# Patient Record
Sex: Female | Born: 1945 | Race: White | Hispanic: No | Marital: Married | Smoking: Never smoker
Health system: Southern US, Community
[De-identification: ages and names within clinical notes are randomized; demographics above are authoritative.]

## PROBLEM LIST (undated history)

## (undated) DIAGNOSIS — R112 Nausea with vomiting, unspecified: Secondary | ICD-10-CM

## (undated) DIAGNOSIS — J45909 Unspecified asthma, uncomplicated: Secondary | ICD-10-CM

## (undated) DIAGNOSIS — M1990 Unspecified osteoarthritis, unspecified site: Secondary | ICD-10-CM

## (undated) DIAGNOSIS — K635 Polyp of colon: Secondary | ICD-10-CM

## (undated) DIAGNOSIS — Z9889 Other specified postprocedural states: Secondary | ICD-10-CM

## (undated) DIAGNOSIS — N6019 Diffuse cystic mastopathy of unspecified breast: Secondary | ICD-10-CM

## (undated) DIAGNOSIS — I82403 Acute embolism and thrombosis of unspecified deep veins of lower extremity, bilateral: Secondary | ICD-10-CM

## (undated) DIAGNOSIS — D689 Coagulation defect, unspecified: Secondary | ICD-10-CM

## (undated) DIAGNOSIS — N3090 Cystitis, unspecified without hematuria: Secondary | ICD-10-CM

## (undated) DIAGNOSIS — I10 Essential (primary) hypertension: Secondary | ICD-10-CM

## (undated) DIAGNOSIS — T8859XA Other complications of anesthesia, initial encounter: Secondary | ICD-10-CM

## (undated) DIAGNOSIS — R7303 Prediabetes: Secondary | ICD-10-CM

## (undated) DIAGNOSIS — T4145XA Adverse effect of unspecified anesthetic, initial encounter: Secondary | ICD-10-CM

## (undated) DIAGNOSIS — E119 Type 2 diabetes mellitus without complications: Secondary | ICD-10-CM

## (undated) HISTORY — PX: COLONOSCOPY: SHX174

## (undated) HISTORY — DX: Essential (primary) hypertension: I10

## (undated) HISTORY — PX: OTHER SURGICAL HISTORY: SHX169

## (undated) HISTORY — DX: Diffuse cystic mastopathy of unspecified breast: N60.19

## (undated) HISTORY — DX: Cystitis, unspecified without hematuria: N30.90

## (undated) HISTORY — DX: Acute embolism and thrombosis of unspecified deep veins of lower extremity, bilateral: I82.403

## (undated) HISTORY — PX: BREAST BIOPSY: SHX20

## (undated) HISTORY — DX: Polyp of colon: K63.5

## (undated) HISTORY — PX: BREAST CYST ASPIRATION: SHX578

## (undated) HISTORY — PX: SPINE SURGERY: SHX786

## (undated) HISTORY — DX: Coagulation defect, unspecified: D68.9

## (undated) HISTORY — DX: Unspecified osteoarthritis, unspecified site: M19.90

---

## 1974-10-13 HISTORY — PX: TUBAL LIGATION: SHX77

## 1974-10-13 HISTORY — PX: BREAST LUMPECTOMY: SHX2

## 1992-10-13 DIAGNOSIS — K635 Polyp of colon: Secondary | ICD-10-CM

## 1992-10-13 HISTORY — DX: Polyp of colon: K63.5

## 1992-10-13 HISTORY — PX: POLYPECTOMY: SHX149

## 2000-10-13 HISTORY — PX: BREAST SURGERY: SHX581

## 2004-07-31 ENCOUNTER — Ambulatory Visit: Payer: Self-pay | Admitting: General Surgery

## 2005-09-30 ENCOUNTER — Ambulatory Visit: Payer: Self-pay | Admitting: General Surgery

## 2006-01-23 ENCOUNTER — Ambulatory Visit: Payer: Self-pay | Admitting: Gastroenterology

## 2006-11-05 ENCOUNTER — Ambulatory Visit: Payer: Self-pay | Admitting: General Surgery

## 2007-12-29 ENCOUNTER — Ambulatory Visit: Payer: Self-pay | Admitting: General Surgery

## 2009-01-02 ENCOUNTER — Ambulatory Visit: Payer: Self-pay | Admitting: General Surgery

## 2010-01-11 ENCOUNTER — Ambulatory Visit: Payer: Self-pay | Admitting: General Surgery

## 2010-01-28 ENCOUNTER — Ambulatory Visit: Payer: Self-pay | Admitting: General Surgery

## 2010-04-26 ENCOUNTER — Ambulatory Visit: Payer: Self-pay | Admitting: Gastroenterology

## 2010-08-30 ENCOUNTER — Ambulatory Visit: Payer: Self-pay | Admitting: General Surgery

## 2011-03-21 ENCOUNTER — Ambulatory Visit: Payer: Self-pay | Admitting: General Surgery

## 2012-03-26 ENCOUNTER — Ambulatory Visit: Payer: Self-pay | Admitting: General Surgery

## 2012-04-05 ENCOUNTER — Ambulatory Visit: Payer: Self-pay | Admitting: General Surgery

## 2012-04-12 HISTORY — PX: HAND SURGERY: SHX662

## 2012-04-21 ENCOUNTER — Ambulatory Visit: Payer: Self-pay | Admitting: Unknown Physician Specialty

## 2013-04-14 ENCOUNTER — Ambulatory Visit: Payer: Self-pay | Admitting: General Surgery

## 2013-04-21 ENCOUNTER — Ambulatory Visit: Payer: Self-pay | Admitting: General Surgery

## 2013-04-22 ENCOUNTER — Encounter: Payer: Self-pay | Admitting: General Surgery

## 2013-05-04 ENCOUNTER — Ambulatory Visit: Payer: Self-pay | Admitting: General Surgery

## 2013-05-19 ENCOUNTER — Encounter: Payer: Self-pay | Admitting: General Surgery

## 2013-05-19 ENCOUNTER — Ambulatory Visit (INDEPENDENT_AMBULATORY_CARE_PROVIDER_SITE_OTHER): Payer: Medicare Other | Admitting: General Surgery

## 2013-05-19 VITALS — BP 138/78 | HR 64 | Resp 14 | Ht 61.5 in | Wt 165.0 lb

## 2013-05-19 DIAGNOSIS — N6019 Diffuse cystic mastopathy of unspecified breast: Secondary | ICD-10-CM

## 2013-05-19 DIAGNOSIS — Z1239 Encounter for other screening for malignant neoplasm of breast: Secondary | ICD-10-CM

## 2013-05-19 NOTE — Progress Notes (Signed)
Patient ID: Chelsey [name], female   DOB: October 17, 1945, 67 y.o.   MRN: [id number]  Chief Complaint  Patient presents with  . Follow-up    mammogram    HPI Chelsey [name] is a 67 y.o. female.  who presents for her annual breast evaluation and follow up mammogram. The most recent mammogram was done on 04-21-13 .  Patient does perform regular self breast checks and gets regular mammograms done.  No new breast issues.  HPI  Past Medical History  Diagnosis Date  . Hypertension   . Diffuse cystic mastopathy   . Cystitis   . Cancer     Colon  . Colon polyp 1994  . Arthritis     Past Surgical History  Procedure Laterality Date  . Colonoscopy  2011  . Polypectomy  1994  . Breast surgery Left 2002    excision  . Breast lumpectomy Right 1976  . Tubal ligation  1976  . Hand surgery Left July 2013    thumb    Family History  Problem Relation Age of Onset  . Cancer Father     lung  . Cancer Mother     colon  . Cancer Sister     lung    Social History History  Substance Use Topics  . Smoking status: Never Smoker   . Smokeless tobacco: Never Used  . Alcohol Use: No    No Known Allergies  Current Outpatient Prescriptions  Medication Sig Dispense Refill  . aspirin 81 MG tablet Take 81 mg by mouth daily. 2 daily      . Calcium Carbonate-Vit D-Min (CALTRATE 600+D PLUS PO) Take 2 tablets by mouth daily.      Tery Sanfilippo Calcium (STOOL SOFTENER PO) Take 2 tablets by mouth daily.      Marland Kitchen ECHINACEA PLUS PO Take 2 tablets by mouth daily.      . Glucosamine 750 MG TABS Take 2 tablets by mouth daily.      . hydrochlorothiazide (HYDRODIURIL) 12.5 MG tablet Take 12.5 mg by mouth daily.      . Multiple Vitamins-Minerals (CENTRUM SILVER PO) Take 1 tablet by mouth daily.      . vitamin E 400 UNIT capsule Take 400 Units by mouth daily.       No current facility-administered medications for this visit.    Review of Systems Review of Systems  Constitutional: Negative.    Respiratory: Negative.   Cardiovascular: Negative.     Blood pressure 138/78, pulse 64, resp. rate 14, height 5' 1.5" (1.562 m), weight 165 lb (74.844 kg).  Physical Exam Physical Exam  Constitutional: She appears well-developed and well-nourished.  Eyes: Conjunctivae are normal.  Neck: Neck supple. No thyromegaly present.  Cardiovascular: Normal rate and regular rhythm.   Pulmonary/Chest: Effort normal and breath sounds normal. Right breast exhibits no inverted nipple, no mass, no nipple discharge, no skin change and no tenderness. Left breast exhibits no inverted nipple, no mass, no nipple discharge, no skin change and no tenderness.  Lymphadenopathy:    She has no cervical adenopathy.    She has no axillary adenopathy.  Neurological: She is alert.  Skin: Skin is warm and dry.    Data Reviewed Mammogram reviewed and stable.  Assessment    Stable exam.    Plan    Follow up in one year with bilateral screening mammogram.       SANKAR,SEEPLAPUTHUR G 05/20/2013, 5:40 AM

## 2013-05-19 NOTE — Patient Instructions (Addendum)
Continue self breast exams. Call office for any new breast issues or concerns. 

## 2013-05-20 ENCOUNTER — Encounter: Payer: Self-pay | Admitting: General Surgery

## 2013-05-20 DIAGNOSIS — N6019 Diffuse cystic mastopathy of unspecified breast: Secondary | ICD-10-CM | POA: Insufficient documentation

## 2013-07-18 DIAGNOSIS — B351 Tinea unguium: Secondary | ICD-10-CM

## 2013-09-16 ENCOUNTER — Ambulatory Visit: Payer: Self-pay | Admitting: Gastroenterology

## 2014-05-22 DIAGNOSIS — I10 Essential (primary) hypertension: Secondary | ICD-10-CM | POA: Insufficient documentation

## 2014-05-22 DIAGNOSIS — M8580 Other specified disorders of bone density and structure, unspecified site: Secondary | ICD-10-CM | POA: Insufficient documentation

## 2014-05-29 ENCOUNTER — Ambulatory Visit: Payer: No Typology Code available for payment source | Admitting: General Surgery

## 2014-06-01 ENCOUNTER — Ambulatory Visit: Payer: Self-pay | Admitting: General Surgery

## 2014-06-01 ENCOUNTER — Encounter: Payer: Self-pay | Admitting: General Surgery

## 2014-06-22 ENCOUNTER — Encounter: Payer: Self-pay | Admitting: General Surgery

## 2014-06-22 ENCOUNTER — Ambulatory Visit (INDEPENDENT_AMBULATORY_CARE_PROVIDER_SITE_OTHER): Payer: Medicare Other | Admitting: General Surgery

## 2014-06-22 VITALS — BP 130/78 | HR 72 | Resp 12 | Ht 61.25 in | Wt 168.0 lb

## 2014-06-22 DIAGNOSIS — N6019 Diffuse cystic mastopathy of unspecified breast: Secondary | ICD-10-CM

## 2014-06-22 NOTE — Progress Notes (Signed)
Patient ID: Chelsey [name], female   DOB: Nov 03, 1945, 68 y.o.   MRN: [id number]  Chief Complaint  Patient presents with  . Follow-up    mammogram    HPI Chelsey [name] is a 68 y.o. female who presents for a breast evaluation. The most recent mammogram was done on 06/01/14. Patient does perform regular self breast checks and gets regular mammograms done. The patient denies any new breast problems.    HPI  Past Medical History  Diagnosis Date  . Hypertension   . Diffuse cystic mastopathy   . Cystitis   . Cancer     Colon  . Colon polyp 1994  . Arthritis     Past Surgical History  Procedure Laterality Date  . Colonoscopy  2011, 09/2013  . Polypectomy  1994  . Breast surgery Left 2002    excision  . Breast lumpectomy Right 1976  . Tubal ligation  1976  . Hand surgery Left July 2013    thumb    Family History  Problem Relation Age of Onset  . Cancer Father     lung  . Cancer Mother     colon  . Cancer Sister     lung  . Pulmonary fibrosis Sister   . Pulmonary fibrosis Brother     Social History History  Substance Use Topics  . Smoking status: Never Smoker   . Smokeless tobacco: Never Used  . Alcohol Use: No    No Known Allergies  Current Outpatient Prescriptions  Medication Sig Dispense Refill  . aspirin 81 MG tablet Take 81 mg by mouth daily. 2 daily      . Calcium Carbonate-Vit D-Min (CALTRATE 600+D PLUS PO) Take 2 tablets by mouth daily.      Tery Sanfilippo Calcium (STOOL SOFTENER PO) Take 2 tablets by mouth daily.      Marland Kitchen ECHINACEA PLUS PO Take 2 tablets by mouth daily.      . hydrochlorothiazide (HYDRODIURIL) 12.5 MG tablet Take 12.5 mg by mouth daily.      . Multiple Vitamins-Minerals (CENTRUM SILVER PO) Take 1 tablet by mouth daily.      . Omega-3 Fatty Acids (FISH OIL PO) Take 1,200 mg by mouth daily.      . vitamin E 400 UNIT capsule Take 400 Units by mouth daily.       No current facility-administered medications for this visit.     Review of Systems Review of Systems  Blood pressure 130/78, pulse 72, resp. rate 12, height 5' 1.25" (1.556 m), weight 168 lb (76.204 kg).  Physical Exam Physical Exam  Constitutional: She is oriented to person, place, and time. She appears well-developed and well-nourished.  Eyes: Conjunctivae are normal. No scleral icterus.  Neck: Neck supple. No thyromegaly present.  Cardiovascular: Normal rate, regular rhythm and normal heart sounds.   No murmur heard. Pulmonary/Chest: Effort normal and breath sounds normal. Right breast exhibits no inverted nipple, no mass, no nipple discharge, no skin change and no tenderness. Left breast exhibits no inverted nipple, no mass, no nipple discharge, no skin change and no tenderness.  Abdominal: Soft. Normal appearance and bowel sounds are normal. There is no hepatosplenomegaly. There is no tenderness.  Lymphadenopathy:    She has no cervical adenopathy.    She has no axillary adenopathy.  Neurological: She is alert and oriented to person, place, and time.  Skin: Skin is warm and dry.    Data Reviewed  Mammogram reviewed and stable.   Assessment  Stable exam. History of FCD.    Plan    Patient to return in 1 year with bilateral screening mammogram.        Gerlene Burdock G 06/23/2014, 12:41 PM

## 2014-06-22 NOTE — Patient Instructions (Signed)
Patient to return in 1 year for follow up.Continue self breast exams. Call office for any new breast issues or concerns.  

## 2014-06-23 ENCOUNTER — Encounter: Payer: Self-pay | Admitting: General Surgery

## 2014-08-14 ENCOUNTER — Encounter: Payer: Self-pay | Admitting: General Surgery

## 2014-08-18 DIAGNOSIS — B353 Tinea pedis: Secondary | ICD-10-CM

## 2014-09-12 HISTORY — PX: LASER ABLATION: SHX1947

## 2015-02-04 NOTE — Op Note (Signed)
PATIENT NAME:  Chelsey [name], Chelsey [name] MR#:  [id number] DATE OF BIRTH:  [birth year]  DATE OF PROCEDURE:  04/21/2012  PREOPERATIVE DIAGNOSIS: Carpometacarpal arthritis, left thumb.   POSTOPERATIVE DIAGNOSIS: Carpometacarpal arthritis, left thumb.   PROCEDURE: Tendon interpositional arthroplasty carpometacarpal joint, left thumb.   SURGEON: Alda Berthold., M.D.   ANESTHESIA: General.   HISTORY: The patient had a long history of symptomatic degenerative arthritis of the [facility] joint of the left thumb. She had been treated with splinting and injection of the left [facility] joint  with steroid and anesthetic. This did not relieve her symptoms. She was brought in for surgery due to progression of her disease.   DESCRIPTION OF PROCEDURE: The patient was taken to the Operating Room where satisfactory general anesthesia was achieved. A tourniquet was applied to her left upper extremity and then the left upper extremity was prepped and draped in the usual fashion for a procedure about the hand. The left upper extremity was exsanguinated and the tourniquet was inflated.   Incidentally, the patient was given 2 grams of Kefzol IV prior to the start of the procedure.   Next, an S-type incision was made over the [facility] joint of her left thumb. Dissection was carried down to the extensor mechanism. The extensor tendons were retracted and then the [facility] joint capsule was incised in a longitudinal fashion. I dissected out the trapezium and then excised it in its entirety using an oscillating saw along with a small round power bur and a rongeur. Next, I made about a 2 cm incision over the volar aspect of her distal forearm. This was about 10 cm proximal to the distal insertion of the flexor carpi radialis. I dissected down to the flexor carpi radialis and divided it completely from its musculotendinous junction.   I then delivered the flexor carpi radialis tendon into the [facility] joint.   I  measured the flexor carpi  radialis tendon and thought that a 4 mm hole was going to be appropriate. I then drilled a 4 mm hole in an oblique fashion through the base of the left first metacarpal. The tendon was passed through the hole from within the joint to the outside of the joint dorsally. The base of the metacarpal was then positioned appropriately and the tendon was tensioned and affixed to the tunnel with 4 x 10 mm Arthrex tenodesis screw. This was a [employer] screw. The remaining portion of the flexor carpi radialis tendon was then placed into the previously created trapezial defect. The capsule was closed over the tendon with 2-0 Vicryl sutures. The tourniquet was released. It had been up a little over an hour. Bleeding was controlled with coagulation cautery and digital pressure. The wound was irrigated with GU irrigant. The [facility] joint skin was closed with 4-0 nylon suture in vertical mattress fashion. The incision over the volar aspect of the left forearm was closed with 4-0 nylon sutures in vertical mattress fashion. I did infiltrate both wounds with 3 to 4 mL of Marcaine with epinephrine and then a radial nerve block was performed on the distal forearm with 0.5% Marcaine. Betadine was applied to the wounds followed by a sterile dressing. A fiberglass thumb spica splint was applied and then the patient was awakened and transferred to her stretcher bed. She was taken to the recovery room in satisfactory condition. Blood loss was negligible. ____________________________ Alda Berthold., MD hbk:slb D: 04/22/2012 10:08:30 ET T: 04/22/2012 11:05:17 ET JOB#: [id number]  cc: Randon Goldsmith  Montez HagemanJr., MD, <Dictator> Randon GoldsmithHAROLD B Caellum Mancil, JR MD ELECTRONICALLY SIGNED 04/26/2012 12:00

## 2015-04-19 ENCOUNTER — Other Ambulatory Visit: Payer: Self-pay

## 2015-04-19 DIAGNOSIS — Z1231 Encounter for screening mammogram for malignant neoplasm of breast: Secondary | ICD-10-CM

## 2015-06-07 ENCOUNTER — Ambulatory Visit
Admission: RE | Admit: 2015-06-07 | Discharge: 2015-06-07 | Disposition: A | Discharge status: Home / Self Care | Payer: Medicare Other | Source: Ambulatory Visit | Attending: General Surgery | Admitting: General Surgery

## 2015-06-07 ENCOUNTER — Other Ambulatory Visit: Payer: Self-pay | Admitting: General Surgery

## 2015-06-07 DIAGNOSIS — Z1231 Encounter for screening mammogram for malignant neoplasm of breast: Secondary | ICD-10-CM

## 2015-06-14 ENCOUNTER — Encounter: Payer: Self-pay | Admitting: General Surgery

## 2015-06-14 ENCOUNTER — Ambulatory Visit (INDEPENDENT_AMBULATORY_CARE_PROVIDER_SITE_OTHER): Payer: Medicare Other | Admitting: General Surgery

## 2015-06-14 VITALS — BP 138/80 | HR 72 | Resp 12 | Ht 61.5 in | Wt 170.0 lb

## 2015-06-14 DIAGNOSIS — N6019 Diffuse cystic mastopathy of unspecified breast: Secondary | ICD-10-CM | POA: Diagnosis not present

## 2015-06-14 NOTE — Patient Instructions (Addendum)
Continue self breast exams. Call office for any new breast issues or concerns. Patient to return in 1 year with bilateral screening mammogram

## 2015-06-14 NOTE — Progress Notes (Signed)
Patient ID: Chelsey [name], female   DOB: 03-Sep-1946, 69 y.o.   MRN: [id number]  Chief Complaint  Patient presents with  . Follow-up    HPI Chelsey [name] is a 69 y.o. female.  who presents for a breast evaluation. The most recent mammogram was done on 06-07-15.  Patient does perform regular self breast checks and gets regular mammograms done.   No new breast issues.  HPI  Past Medical History  Diagnosis Date  . Hypertension   . Diffuse cystic mastopathy   . Cystitis   . Colon polyp 1994  . Arthritis     Past Surgical History  Procedure Laterality Date  . Colonoscopy  2011, 09/2013  . Polypectomy  1994  . Breast surgery Left 2002    excision  . Breast lumpectomy Right 1976  . Tubal ligation  1976  . Hand surgery Left July 2013    thumb  . Breast biopsy Bilateral     neg  . Breast cyst aspiration Right     neg  . Laser ablation Bilateral Dec 2015    Dr Wyn Quaker    Family History  Problem Relation Age of Onset  . Cancer Father     lung  . Cancer Mother     colon  . Cancer Sister     lung  . Pulmonary fibrosis Sister   . Pulmonary fibrosis Brother     Social History Social History  Substance Use Topics  . Smoking status: Never Smoker   . Smokeless tobacco: Never Used  . Alcohol Use: No    No Known Allergies  Current Outpatient Prescriptions  Medication Sig Dispense Refill  . aspirin 81 MG tablet Take 81 mg by mouth daily. 2 daily    . Calcium Carbonate-Vit D-Min (CALTRATE 600+D PLUS PO) Take 2 tablets by mouth daily.    Tery Sanfilippo Calcium (STOOL SOFTENER PO) Take 2 tablets by mouth daily.    Marland Kitchen ECHINACEA PLUS PO Take 2 tablets by mouth daily.    . hydrochlorothiazide (HYDRODIURIL) 12.5 MG tablet Take 12.5 mg by mouth daily.    . Multiple Vitamins-Minerals (CENTRUM SILVER PO) Take 1 tablet by mouth daily.    . Omega-3 Fatty Acids (FISH OIL PO) Take 1,200 mg by mouth daily.    . vitamin E 400 UNIT capsule Take 400 Units by mouth daily.     No  current facility-administered medications for this visit.    Review of Systems Review of Systems  Constitutional: Negative.   Respiratory: Negative.   Cardiovascular: Negative.     Blood pressure 138/80, pulse 72, resp. rate 12, height 5' 1.5" (1.562 m), weight 170 lb (77.111 kg).  Physical Exam Physical Exam  Constitutional: She is oriented to person, place, and time. She appears well-developed and well-nourished.  HENT:  Mouth/Throat: Oropharynx is clear and moist.  Eyes: Conjunctivae are normal. No scleral icterus.  Neck: Neck supple.  Cardiovascular: Normal rate, regular rhythm and normal heart sounds.   Pulmonary/Chest: Effort normal and breath sounds normal. Right breast exhibits no inverted nipple, no mass, no nipple discharge, no skin change and no tenderness. Left breast exhibits no inverted nipple, no mass, no nipple discharge, no skin change and no tenderness.  Abdominal: Soft. Normal appearance. There is no hepatomegaly. There is no tenderness.  Lymphadenopathy:    She has no cervical adenopathy.    She has no axillary adenopathy.  Neurological: She is alert and oriented to person, place, and time.  Skin: Skin is  warm and dry.  Psychiatric: Her behavior is normal.    Data Reviewed Mammogram reviewed and stable.  Assessment    Stable physical exam. History of FCD.      Plan    Patient to return in 1 year with bilateral screening mammogram.  Continue self breast exams. Call office for any new breast issues or concerns.        PCP:  Modesto Charon 06/14/2015, 4:50 PM

## 2015-08-23 ENCOUNTER — Ambulatory Visit: Payer: Medicare Other | Admitting: Podiatry

## 2015-08-23 ENCOUNTER — Encounter: Payer: Self-pay | Admitting: Podiatry

## 2015-08-23 ENCOUNTER — Ambulatory Visit (INDEPENDENT_AMBULATORY_CARE_PROVIDER_SITE_OTHER): Payer: Medicare Other | Admitting: Podiatry

## 2015-08-23 VITALS — BP 171/86 | HR 69 | Resp 18

## 2015-08-23 DIAGNOSIS — L74519 Primary focal hyperhidrosis, unspecified: Secondary | ICD-10-CM | POA: Diagnosis not present

## 2015-08-23 DIAGNOSIS — B353 Tinea pedis: Secondary | ICD-10-CM

## 2015-08-23 DIAGNOSIS — R61 Generalized hyperhidrosis: Secondary | ICD-10-CM

## 2015-08-23 MED ORDER — ALUMINUM CHLORIDE 20 % EX SOLN: Freq: Every day | CUTANEOUS | Status: DC

## 2015-08-23 NOTE — Progress Notes (Signed)
   Subjective:    Patient ID: Chelsey [name], female    DOB: 09-06-1946, 69 y.o.   MRN: [id number]  HPI  69 year old female presents the office they for concerns of athlete's foot to both of her feet. She states that she has had 3 bouts with athlete's foot. She states that back in June she had redness and scaliness a moccasin fashion to both of her feet and she was placed on 30 days of Lamisil as well as had topical treatments. She states her feet continue to sweat quite a bit which she believes is causing athlete's foot. She continues to have the small amount of athlete's foot to both of her fifth toes. She has tried changing her socks, shoes and bleaching her bathrooms and other surfaces and she does not go barefoot in order to help get rid of the fungus. She denies any redness or drainage. No open sores. No other complaints at this time.  Review of Systems  All other systems reviewed and are negative.      Objective:   Physical Exam General: AAO x3, NAD  Dermatological: Along the sulcus of the fifth toes bilaterally small amount of macerated tissue with small amount of erythematous skin which resembles tinea pedis. There is no other areas of evidence of rashes or skin irritation interdigitally or to other areas of the foot. There does appear to be excessive sweating to the plantar aspect of the feet. Nails x 10 are well manicured; remaining integument appears unremarkable at this time. There are no open sores, no preulcerative lesions, no rash or signs of infection present.  Vascular: Dorsalis Pedis artery and Posterior Tibial artery pedal pulses are 2/4 bilateral with immedate capillary fill time. Pedal hair growth present. No varicosities and no lower extremity edema present bilateral. There is no pain with calf compression, swelling, warmth, erythema.   Neruologic: Grossly intact via light touch bilateral. Vibratory intact via tuning fork bilateral. Protective threshold with Semmes  Wienstein monofilament intact to all pedal sites bilateral. Patellar and Achilles deep tendon reflexes 2+ bilateral. No Babinski or clonus noted bilateral.   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 No areas of tenderness to bilateral lower extremities. No pain, crepitus, or limitation noted with foot and ankle range of motion bilateral. Muscular strength 5/5 in all groups tested bilateral.  Gait: Unassisted, Nonantalgic.      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 -Treatment options discussed including all alternatives, risks, and complications -Etiology of symptoms were discussed -Prescribed Drysol to apply to the area. Also discussed sock changes, vinegar soaks, and other home remedies to help with athlete's foot and recurrence of this. -Follow-up of symptoms do not resolve or recur. In the meantime call the office with any questions, concerns or any change in symptoms.  Ovid CurdMatthew Wagoner, DPM

## 2015-08-23 NOTE — Patient Instructions (Signed)

## 2015-08-24 DIAGNOSIS — B353 Tinea pedis: Secondary | ICD-10-CM | POA: Insufficient documentation

## 2015-08-24 DIAGNOSIS — R61 Generalized hyperhidrosis: Secondary | ICD-10-CM | POA: Insufficient documentation

## 2015-11-29 ENCOUNTER — Telehealth: Payer: Self-pay | Admitting: *Deleted

## 2015-11-29 NOTE — Telephone Encounter (Addendum)
Pt states she's been using Drysol once a week since 09/2015, but one area has opened beneath her little toe, what does she need to do?  11/30/2015-INFORMED PT OF DR. Gabriel Rung ORDERS to apply antibiotic ointment, no soaks and to make an appt.  Pt agreed and I transferred to schedulers.

## 2015-11-29 NOTE — Telephone Encounter (Signed)
Antibiotic ointment and bandage. No soaking. Come into the office.

## 2015-12-04 ENCOUNTER — Ambulatory Visit (INDEPENDENT_AMBULATORY_CARE_PROVIDER_SITE_OTHER): Payer: Medicare Other | Admitting: Podiatry

## 2015-12-04 ENCOUNTER — Encounter: Payer: Self-pay | Admitting: Podiatry

## 2015-12-04 VITALS — BP 163/81 | HR 59 | Resp 18

## 2015-12-04 DIAGNOSIS — R234 Changes in skin texture: Secondary | ICD-10-CM | POA: Diagnosis not present

## 2015-12-04 NOTE — Progress Notes (Signed)
Subjective: 70 year old female presents the office today for concerns of cracked skin on the bottom of her left fifth toe which is been ongoing the last 2 weeks. She states that she has been using Drysol to help with the sweat which seems to help. She states that since the area cracked open and the bottom of the fifth toe she's been applying a small amount of Neosporin to the area. She has not noticed any drainage or use any redness or swelling. She first is the area after she was cleaned her foot in the area and had a burn to it overlying the area.Denies any systemic complaints such as fevers, chills, nausea, vomiting. No acute changes since last appointment, and no other complaints at this time.   Objective: AAO x3, NAD DP/PT pulses palpable bilaterally, CRT less than 3 seconds Protective sensation intact with Simms Weinstein monofilament, There is a small crack in the skin on the plantar aspect of the left fifth toe along the sulcus plantarly. This is very superficial. There is no surrounding edema, erythema, ascending cellulitis, drainage or purulence. There is no tenderness palpation of the area. There is a small amount of hyperkeratotic tissue around the lesion. 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 No areas of pinpoint bony tenderness or pain with vibratory sensation. MMT 5/5, ROM WNL. No edema, erythema, increase in warmth to bilateral lower extremities.  No open lesions or pre-ulcerative lesions.  No pain with calf compression, swelling, warmth, erythema  Assessment: Skin crack left fifth toe likely to from athlete's foot versus dry skin  Plan: -All treatment options discussed with the patient including all alternatives, risks, complications.  -Area was debrided of hyperkeratotic tissue. A small amount and about equinus was applied followed by a dressing. Continue this daily. Hold off on Drysol for now. There is no evidence of excessive sweating at this time. There  is no other evidence of tinea pedis. This is likely due to the deformity of her toe as well. Dispensed offloading pads to help take pressure off the toes. -Monitor for any clinical signs or symptoms of infection and directed to call the office immediately should any occur or go to the ER. -Follow-up in 3 weeks if not healed or sooner if any problems arise or any worsening. In the meantime, encouraged to call the office with any questions, concerns, change in symptoms.   Ovid Curd, DPM

## 2016-03-06 ENCOUNTER — Other Ambulatory Visit: Payer: Self-pay

## 2016-03-06 DIAGNOSIS — Z1231 Encounter for screening mammogram for malignant neoplasm of breast: Secondary | ICD-10-CM

## 2016-03-27 DIAGNOSIS — M25542 Pain in joints of left hand: Secondary | ICD-10-CM | POA: Insufficient documentation

## 2016-06-10 ENCOUNTER — Other Ambulatory Visit: Payer: Self-pay | Admitting: General Surgery

## 2016-06-10 ENCOUNTER — Ambulatory Visit
Admission: RE | Admit: 2016-06-10 | Discharge: 2016-06-10 | Disposition: A | Discharge status: Home / Self Care | Payer: Medicare Other | Source: Ambulatory Visit | Attending: General Surgery | Admitting: General Surgery

## 2016-06-10 DIAGNOSIS — Z1231 Encounter for screening mammogram for malignant neoplasm of breast: Secondary | ICD-10-CM | POA: Diagnosis present

## 2016-06-18 ENCOUNTER — Encounter: Payer: Self-pay | Admitting: *Deleted

## 2016-06-24 ENCOUNTER — Ambulatory Visit (INDEPENDENT_AMBULATORY_CARE_PROVIDER_SITE_OTHER): Payer: Medicare Other | Admitting: General Surgery

## 2016-06-24 ENCOUNTER — Encounter: Payer: Self-pay | Admitting: General Surgery

## 2016-06-24 VITALS — BP 126/72 | HR 74 | Resp 12 | Ht 61.0 in | Wt 166.0 lb

## 2016-06-24 DIAGNOSIS — Z800 Family history of malignant neoplasm of digestive organs: Secondary | ICD-10-CM

## 2016-06-24 DIAGNOSIS — N6012 Diffuse cystic mastopathy of left breast: Secondary | ICD-10-CM

## 2016-06-24 DIAGNOSIS — Z86010 Personal history of colonic polyps: Secondary | ICD-10-CM | POA: Diagnosis not present

## 2016-06-24 DIAGNOSIS — N6011 Diffuse cystic mastopathy of right breast: Secondary | ICD-10-CM | POA: Diagnosis not present

## 2016-06-24 NOTE — Patient Instructions (Addendum)
The patient is aware to call back for any questions or concerns. Patient to return in 1 year with bilateral screening mammogram.

## 2016-06-24 NOTE — Progress Notes (Addendum)
Patient ID: Chelsey KiefDionne R [name], female   DOB: Sep 21, 1946, 70 y.o.   MRN: [id number]  Chief Complaint  Patient presents with  . Follow-up    mammogram    HPI Chelsey [name] is a 70 y.o. female.  who presents for a breast evaluation. The most recent mammogram was done on 06-10-16.  Patient does perform regular self breast checks and gets regular mammograms done.   No new breast issues.  I have reviewed the history of present illness with the patient.   HPI  Past Medical History:  Diagnosis Date  . Arthritis   . Colon polyp 1994  . Cystitis   . Diffuse cystic mastopathy   . Hypertension     Past Surgical History:  Procedure Laterality Date  . BREAST BIOPSY Bilateral    neg  . BREAST CYST ASPIRATION Right    neg  . BREAST LUMPECTOMY Right 1976  . BREAST SURGERY Left 2002   excision  . COLONOSCOPY  2011, 09/2013  . HAND SURGERY Left July 2013   thumb  . LASER ABLATION Bilateral Dec 2015   Dr Wyn Quakerew  . POLYPECTOMY  1994  . TUBAL LIGATION  1976    Family History  Problem Relation Age of Onset  . Cancer Father     lung  . Cancer Mother     colon  . Cancer Sister     lung  . Pulmonary fibrosis Sister   . Pulmonary fibrosis Brother   . Breast cancer Neg Hx     Social History Social History  Substance Use Topics  . Smoking status: Never Smoker  . Smokeless tobacco: Never Used  . Alcohol use No    No Known Allergies  Current Outpatient Prescriptions  Medication Sig Dispense Refill  . aspirin 81 MG tablet Take 81 mg by mouth daily. 2 daily    . Calcium Carbonate-Vit D-Min (CALTRATE 600+D PLUS PO) Take 2 tablets by mouth daily.    . Cholecalciferol (VITAMIN D3) 2000 units capsule Take 2,000 Units by mouth daily.     Tery Sanfilippo. Docusate Calcium (STOOL SOFTENER PO) Take 2 tablets by mouth daily.    Marland Kitchen. ECHINACEA PLUS PO Take 2 tablets by mouth daily.    . hydrochlorothiazide (HYDRODIURIL) 12.5 MG tablet Take 12.5 mg by mouth daily.    . Multiple Vitamins-Minerals  (CENTRUM SILVER PO) Take 1 tablet by mouth daily.    . Omega-3 Fatty Acids (FISH OIL PO) Take 1,200 mg by mouth daily.    . vitamin E 400 UNIT capsule Take 400 Units by mouth daily.     No current facility-administered medications for this visit.     Review of Systems Review of Systems  Blood pressure 126/72, pulse 74, resp. rate 12, height 5\' 1"  (1.549 m), weight 166 lb (75.3 kg).  Physical Exam Physical Exam  Constitutional: She is oriented to person, place, and time. She appears well-developed and well-nourished.  HENT:  Mouth/Throat: Oropharynx is clear and moist.  Eyes: Conjunctivae are normal. No scleral icterus.  Neck: Neck supple.  Cardiovascular: Normal rate, regular rhythm and normal heart sounds.   Pulmonary/Chest: Effort normal and breath sounds normal. Right breast exhibits no inverted nipple, no mass, no nipple discharge, no skin change and no tenderness. Left breast exhibits no inverted nipple, no mass, no nipple discharge, no skin change and no tenderness.  Abdominal: Soft. There is no tenderness.  Lymphadenopathy:    She has no cervical adenopathy.    She has no axillary adenopathy.  Neurological: She is alert and oriented to person, place, and time.  Skin: Skin is warm and dry.  Psychiatric: Her behavior is normal.    Data Reviewed Mammogram reviewed and stable  Assessment    Stable physical exam. History of FCD.    Plan    Patient to return in 1 year with bilateral screening mammogram.  Continue self breast exams. Call office for any new breast issues or concerns.  Colonoscopy due 2019. Family history of colon cancer. Personal history colon polyps.     This information has been scribed by Dorathy Daft RN, BSN,BC.   Aziyah Provencal G 07/11/2016, 7:21 AM

## 2016-07-03 DIAGNOSIS — M65831 Other synovitis and tenosynovitis, right forearm: Secondary | ICD-10-CM | POA: Insufficient documentation

## 2016-12-02 DIAGNOSIS — M1811 Unilateral primary osteoarthritis of first carpometacarpal joint, right hand: Secondary | ICD-10-CM | POA: Insufficient documentation

## 2017-03-30 ENCOUNTER — Other Ambulatory Visit: Payer: Self-pay

## 2017-03-30 DIAGNOSIS — Z1231 Encounter for screening mammogram for malignant neoplasm of breast: Secondary | ICD-10-CM

## 2017-06-11 ENCOUNTER — Ambulatory Visit
Admission: RE | Admit: 2017-06-11 | Discharge: 2017-06-11 | Disposition: A | Discharge status: Home / Self Care | Payer: Medicare Other | Source: Ambulatory Visit | Attending: General Surgery | Admitting: General Surgery

## 2017-06-11 DIAGNOSIS — Z1231 Encounter for screening mammogram for malignant neoplasm of breast: Secondary | ICD-10-CM | POA: Insufficient documentation

## 2017-06-18 ENCOUNTER — Ambulatory Visit (INDEPENDENT_AMBULATORY_CARE_PROVIDER_SITE_OTHER): Payer: Medicare Other | Admitting: General Surgery

## 2017-06-18 ENCOUNTER — Encounter: Payer: Self-pay | Admitting: General Surgery

## 2017-06-18 VITALS — BP 142/72 | HR 62 | Resp 12 | Ht 61.0 in | Wt 157.0 lb

## 2017-06-18 DIAGNOSIS — Z800 Family history of malignant neoplasm of digestive organs: Secondary | ICD-10-CM

## 2017-06-18 DIAGNOSIS — Z86010 Personal history of colonic polyps: Secondary | ICD-10-CM

## 2017-06-18 DIAGNOSIS — N6011 Diffuse cystic mastopathy of right breast: Secondary | ICD-10-CM

## 2017-06-18 DIAGNOSIS — N6012 Diffuse cystic mastopathy of left breast: Secondary | ICD-10-CM | POA: Diagnosis not present

## 2017-06-18 NOTE — Progress Notes (Signed)
Patient ID: Chelsey [name], female   DOB: 1946-03-08, 71 y.o.   MRN: [id number]  Chief Complaint  Patient presents with  . Follow-up    HPI Chelsey [name] is a 71 y.o. female.  who presents for a breast evaluation. The most recent mammogram was done on 06-11-17. Patient does perform regular self breast checks and gets regular mammograms done.  No new breast issues.  Patient has an extensive family history of cancer with her sister and father having lung cancer and her mother with colon cancer. She states her sister and brother both died this year of pulmonary fibrosis. She denies having any shortness of breath, chest pain, or any concerns.  Negative tobacco history.    HPI  Past Medical History:  Diagnosis Date  . Arthritis   . Colon polyp 1994  . Cystitis   . Diffuse cystic mastopathy   . Hypertension     Past Surgical History:  Procedure Laterality Date  . BREAST BIOPSY Bilateral    neg  . BREAST CYST ASPIRATION Right    neg  . BREAST LUMPECTOMY Right 1976  . BREAST SURGERY Left 2002   excision  . COLONOSCOPY  2011, 09/2013  . HAND SURGERY Left July 2013   thumb  . LASER ABLATION Bilateral Dec 2015   Dr Wyn Quaker  . POLYPECTOMY  1994  . TUBAL LIGATION  1976    Family History  Problem Relation Age of Onset  . Cancer Father        lung  . Cancer Mother        colon  . Cancer Sister        lung  . Pulmonary fibrosis Brother   . Pulmonary fibrosis Sister   . Breast cancer Neg Hx     Social History Social History  Substance Use Topics  . Smoking status: Never Smoker  . Smokeless tobacco: Never Used  . Alcohol use No    No Known Allergies  Current Outpatient Prescriptions  Medication Sig Dispense Refill  . aspirin 81 MG tablet Take 81 mg by mouth daily. 2 daily    . Calcium Carbonate-Vit D-Min (CALTRATE 600+D PLUS PO) Take 2 tablets by mouth daily.    . Cholecalciferol (VITAMIN D3) 2000 units capsule Take 2,000 Units by mouth daily.     Tery Sanfilippo  Calcium (STOOL SOFTENER PO) Take 2 tablets by mouth daily.    Marland Kitchen ECHINACEA PLUS PO Take 2 tablets by mouth daily.    . hydrochlorothiazide (HYDRODIURIL) 12.5 MG tablet Take 12.5 mg by mouth daily.    . Multiple Vitamins-Minerals (CENTRUM SILVER PO) Take 1 tablet by mouth daily.    . Omega-3 Fatty Acids (FISH OIL PO) Take 1,200 mg by mouth daily.    . vitamin E 400 UNIT capsule Take 400 Units by mouth daily.     No current facility-administered medications for this visit.     Review of Systems Review of Systems  Constitutional: Negative.   Respiratory: Negative.   Cardiovascular: Negative.     Blood pressure (!) 142/72, pulse 62, resp. rate 12, height  (1.549 m), weight 157 lb (71.2 kg).  Physical Exam Physical Exam  Constitutional: She is oriented to person, place, and time. She appears well-developed and well-nourished.  HENT:  Mouth/Throat: Oropharynx is clear and moist.  Eyes: Conjunctivae are normal. No scleral icterus.  Neck: Neck supple.  Cardiovascular: Normal rate, regular rhythm and normal heart sounds.   Pulmonary/Chest: Effort normal and breath sounds normal.  No respiratory distress. She has no wheezes. She has no rales. Right breast exhibits no inverted nipple, no mass, no nipple discharge, no skin change and no tenderness. Left breast exhibits no inverted nipple, no mass, no nipple discharge, no skin change and no tenderness.  Abdominal: Soft. Normal appearance. There is no tenderness.  Lymphadenopathy:    She has no cervical adenopathy.    She has no axillary adenopathy.  Neurological: She is alert and oriented to person, place, and time.  Skin: Skin is warm and dry.  Psychiatric: Her behavior is normal.    Data Reviewed  Mammogram reviewed and stable.  Assessment    Stable physical exam. History of FCD.  Colonoscopy due 2019.  Family history of colon cancer. Personal history colon polyps    Plan    Colonoscopy due 2019 with Dr Mechele CollinElliott.  Patient  will be asked to follow up with Dr Terance HartBronstein in one year with a bilateral screening mammogram. Counseled patient on monitoring for pulmonary symptoms given family history of lung cancer and pulmonary fibrosis.    HPI, Physical Exam, Assessment and Plan have been scribed under the direction and in the presence of Kathreen CosierS. G. Sankar, MD Dorathy DaftMarsha Hatch, RN  I have completed the exam and reviewed the above documentation for accuracy and completeness.  I agree with the above.  [employer] has been used and any errors in dictation or transcription are unintentional.  Seeplaputhur G. Evette CristalSankar, M.D., F.A.C.S.    Gerlene BurdockSANKAR,SEEPLAPUTHUR G 06/18/2017, 11:59 AM

## 2017-06-18 NOTE — Patient Instructions (Addendum)
The patient is aware to call back for any questions or concerns. Colonoscopy due 2019 with Dr Mechele CollinElliott Patient will be asked to follow up with Dr Terance HartBronstein in one year with a bilateral screening mammogram

## 2017-06-29 DIAGNOSIS — R739 Hyperglycemia, unspecified: Secondary | ICD-10-CM | POA: Insufficient documentation

## 2018-03-16 ENCOUNTER — Other Ambulatory Visit: Payer: Self-pay

## 2018-03-16 ENCOUNTER — Encounter
Admission: RE | Admit: 2018-03-16 | Discharge: 2018-03-16 | Disposition: A | Discharge status: Home / Self Care | Payer: Medicare Other | Source: Ambulatory Visit | Attending: Unknown Physician Specialty | Admitting: Unknown Physician Specialty

## 2018-03-16 DIAGNOSIS — Z01810 Encounter for preprocedural cardiovascular examination: Secondary | ICD-10-CM | POA: Insufficient documentation

## 2018-03-16 DIAGNOSIS — Z01812 Encounter for preprocedural laboratory examination: Secondary | ICD-10-CM | POA: Diagnosis present

## 2018-03-16 DIAGNOSIS — I10 Essential (primary) hypertension: Secondary | ICD-10-CM | POA: Insufficient documentation

## 2018-03-16 DIAGNOSIS — R001 Bradycardia, unspecified: Secondary | ICD-10-CM | POA: Diagnosis not present

## 2018-03-16 HISTORY — DX: Adverse effect of unspecified anesthetic, initial encounter: T41.45XA

## 2018-03-16 HISTORY — DX: Other complications of anesthesia, initial encounter: T88.59XA

## 2018-03-16 LAB — SURGICAL PCR SCREEN
MRSA, PCR: NEGATIVE
Staphylococcus aureus: NEGATIVE

## 2018-03-16 LAB — POTASSIUM: POTASSIUM: 3.7 mmol/L (ref 3.5–5.1)

## 2018-03-16 NOTE — Patient Instructions (Addendum)
Your procedure is scheduled on: 03/24/18 Wed Report to Same Day Surgery 2nd floor medical mall [facility](Medical Mall Entrance-take elevator on left to 2nd floor.  Check in with surgery information desk.) To find out your arrival time please call [phone] between 1PM - 3PM on 03/23/18 Tues  Remember: Instructions that are not followed completely may result in serious medical risk, up to and including death, or upon the discretion of your surgeon and anesthesiologist your surgery may need to be rescheduled.    _x___ 1. Do not eat food after midnight the night before your procedure. You may drink clear liquids up to 2 hours before you are scheduled to arrive at the hospital for your procedure.  Do not drink clear liquids within 2 hours of your scheduled arrival to the hospital.  Clear liquids include  --Water or Apple juice without pulp  --Clear carbohydrate beverage such as ClearFast or Gatorade  --Black Coffee or Clear Tea (No milk, no creamers, do not add anything to                  the coffee or Tea Type 1 and type 2 diabetics should only drink water.  No gum chewing or hard candies.     __x__ 2. No Alcohol for 24 hours before or after surgery.   __x__3. No Smoking or e-cigarettes for 24 prior to surgery.  Do not use any chewable tobacco products for at least 6 hour prior to surgery   ____  4. Bring all medications with you on the day of surgery if instructed.    __x__ 5. Notify your doctor if there is any change in your medical condition     (cold, fever, infections).    x___6. On the morning of surgery brush your teeth with toothpaste and water.  You may rinse your mouth with mouth wash if you wish.  Do not swallow any toothpaste or mouthwash.   Do not wear jewelry, make-up, hairpins, clips or nail polish.  Do not wear lotions, powders, or perfumes. You may wear deodorant.  Do not shave 48 hours prior to surgery. Men may shave face and neck.  Do not bring valuables to the hospital.     [facility] is not responsible for any belongings or valuables.               Contacts, dentures or bridgework may not be worn into surgery.  Leave your suitcase in the car. After surgery it may be brought to your room.  For patients admitted to the hospital, discharge time is determined by your                       treatment team.  _  Patients discharged the day of surgery will not be allowed to drive home.  You will need someone to drive you home and stay with you the night of your procedure.    Please read over the following fact sheets that you were given:   [facility] Pre[facility] and or MRSA Information   _x___ Take anti-hypertensive listed below, cardiac, seizure, asthma,     anti-reflux and psychiatric medicines. These include:  1. None  2.  3.  4.  5.  6.  ____Fleets enema or Magnesium Citrate as directed.   _x___ Use CHG Soap or sage wipes as directed on instruction sheet   ____ Use inhalers on the day of surgery and bring to hospital day of surgery  ____ Stop Metformin and Janumet 2 days prior to surgery.    ____ Take 1/2 of usual insulin dose the night before surgery and none on the morning     surgery.   _x___ Follow recommendations from Cardiologist, Pulmonologist or PCP regarding          stopping Aspirin, Coumadin, Plavix ,Eliquis, Effient, or Pradaxa, and Pletal.  Stopped aspirin last Friday  X____Stop Anti-inflammatories such as Advil, Aleve, Ibuprofen, Motrin, Naproxen, Naprosyn, Goodies powders or aspirin products. OK to take Tylenol and   Celebrex.  Stopped ibuprofen last Friday   _x___ Stop supplements until after surgery.  But may continue Vitamin D, Vitamin B,       and multivitamin.  Stop fish oil and vitamin e tomorrow.   ____ Bring C-Pap to the hospital.

## 2018-03-24 ENCOUNTER — Ambulatory Visit: Payer: Medicare Other | Admitting: Anesthesiology

## 2018-03-24 ENCOUNTER — Ambulatory Visit
Admission: RE | Admit: 2018-03-24 | Discharge: 2018-03-24 | Disposition: A | Discharge status: Home / Self Care | Payer: Medicare Other | Source: Ambulatory Visit | Attending: Unknown Physician Specialty | Admitting: Unknown Physician Specialty

## 2018-03-24 ENCOUNTER — Encounter
Admission: RE | Disposition: A | Discharge status: Home / Self Care | Payer: Self-pay | Source: Ambulatory Visit | Attending: Unknown Physician Specialty

## 2018-03-24 DIAGNOSIS — M810 Age-related osteoporosis without current pathological fracture: Secondary | ICD-10-CM | POA: Insufficient documentation

## 2018-03-24 DIAGNOSIS — I10 Essential (primary) hypertension: Secondary | ICD-10-CM | POA: Insufficient documentation

## 2018-03-24 DIAGNOSIS — Z79899 Other long term (current) drug therapy: Secondary | ICD-10-CM | POA: Diagnosis not present

## 2018-03-24 DIAGNOSIS — M1811 Unilateral primary osteoarthritis of first carpometacarpal joint, right hand: Secondary | ICD-10-CM | POA: Diagnosis present

## 2018-03-24 DIAGNOSIS — Z7982 Long term (current) use of aspirin: Secondary | ICD-10-CM | POA: Diagnosis not present

## 2018-03-24 HISTORY — PX: FINGER ARTHROPLASTY: SHX5017

## 2018-03-24 SURGERY — ARTHROPLASTY, FINGER
Anesthesia: General | Site: Thumb | Laterality: Right | Wound class: Clean

## 2018-03-24 MED ORDER — LIDOCAINE HCL (CARDIAC) PF 100 MG/5ML IV SOSY
PREFILLED_SYRINGE | INTRAVENOUS | Status: DC | PRN
  Administered 2018-03-24: 100 mg via INTRAVENOUS

## 2018-03-24 MED ORDER — MIDAZOLAM HCL 2 MG/2ML IJ SOLN
INTRAMUSCULAR | Status: AC
  Filled 2018-03-24: qty 2

## 2018-03-24 MED ORDER — FENTANYL CITRATE (PF) 100 MCG/2ML IJ SOLN
INTRAMUSCULAR | Status: AC
  Filled 2018-03-24: qty 2

## 2018-03-24 MED ORDER — ACETAMINOPHEN 10 MG/ML IV SOLN
INTRAVENOUS | Status: DC | PRN
  Administered 2018-03-24: 1000 mg via INTRAVENOUS

## 2018-03-24 MED ORDER — PROPOFOL 10 MG/ML IV BOLUS
INTRAVENOUS | Status: AC
  Filled 2018-03-24: qty 20

## 2018-03-24 MED ORDER — ACETAMINOPHEN NICU IV SYRINGE 10 MG/ML
INTRAVENOUS | Status: AC
  Filled 2018-03-24: qty 1

## 2018-03-24 MED ORDER — NORCO 5-325 MG PO TABS
1.0000 | ORAL_TABLET | Freq: Four times a day (QID) | ORAL | 0 refills | Status: AC | PRN
Start: 2018-03-24 — End: 2018-03-29

## 2018-03-24 MED ORDER — ACETAMINOPHEN 325 MG PO TABS: 325.0000 mg | ORAL_TABLET | ORAL | Status: DC | PRN

## 2018-03-24 MED ORDER — FENTANYL CITRATE (PF) 100 MCG/2ML IJ SOLN
25.0000 ug | INTRAMUSCULAR | Status: DC | PRN
  Administered 2018-03-24 (×4): 25 ug via INTRAVENOUS

## 2018-03-24 MED ORDER — MIDAZOLAM HCL 2 MG/2ML IJ SOLN
INTRAMUSCULAR | Status: DC | PRN
  Administered 2018-03-24: 2 mg via INTRAVENOUS

## 2018-03-24 MED ORDER — DEXAMETHASONE SODIUM PHOSPHATE 10 MG/ML IJ SOLN
INTRAMUSCULAR | Status: DC | PRN
  Administered 2018-03-24: 10 mg via INTRAVENOUS

## 2018-03-24 MED ORDER — HYDROCODONE-ACETAMINOPHEN 7.5-325 MG PO TABS
1.0000 | ORAL_TABLET | Freq: Once | ORAL | Status: DC | PRN
  Filled 2018-03-24: qty 1

## 2018-03-24 MED ORDER — PROPOFOL 500 MG/50ML IV EMUL
INTRAVENOUS | Status: AC
  Filled 2018-03-24: qty 50

## 2018-03-24 MED ORDER — FENTANYL CITRATE (PF) 100 MCG/2ML IJ SOLN
INTRAMUSCULAR | Status: DC | PRN
  Administered 2018-03-24 (×4): 25 ug via INTRAVENOUS

## 2018-03-24 MED ORDER — BUPIVACAINE HCL (PF) 0.5 % IJ SOLN
INTRAMUSCULAR | Status: DC | PRN
  Administered 2018-03-24: 10 mL

## 2018-03-24 MED ORDER — CEFAZOLIN SODIUM-DEXTROSE 1-4 GM/50ML-% IV SOLN
INTRAVENOUS | Status: DC | PRN
  Administered 2018-03-24: 1 g via INTRAVENOUS

## 2018-03-24 MED ORDER — NEOMYCIN-POLYMYXIN B GU 40-200000 IR SOLN
Status: AC
  Filled 2018-03-24: qty 2

## 2018-03-24 MED ORDER — BUPIVACAINE HCL (PF) 0.5 % IJ SOLN
INTRAMUSCULAR | Status: AC
  Filled 2018-03-24: qty 30

## 2018-03-24 MED ORDER — PROPOFOL 10 MG/ML IV BOLUS
INTRAVENOUS | Status: DC | PRN
  Administered 2018-03-24: 150 mg via INTRAVENOUS

## 2018-03-24 MED ORDER — PROPOFOL 500 MG/50ML IV EMUL
INTRAVENOUS | Status: DC | PRN
  Administered 2018-03-24: 150 ug/kg/min via INTRAVENOUS

## 2018-03-24 MED ORDER — FAMOTIDINE 20 MG PO TABS
20.0000 mg | ORAL_TABLET | Freq: Once | ORAL | Status: AC
  Administered 2018-03-24: 20 mg via ORAL

## 2018-03-24 MED ORDER — LACTATED RINGERS IV SOLN
INTRAVENOUS | Status: DC
  Administered 2018-03-24: 12:00:00 via INTRAVENOUS

## 2018-03-24 MED ORDER — FAMOTIDINE 20 MG PO TABS
ORAL_TABLET | ORAL | Status: AC
  Administered 2018-03-24: 20 mg via ORAL
  Filled 2018-03-24: qty 1

## 2018-03-24 MED ORDER — ONDANSETRON HCL 4 MG/2ML IJ SOLN
INTRAMUSCULAR | Status: DC | PRN
  Administered 2018-03-24: 4 mg via INTRAVENOUS

## 2018-03-24 MED ORDER — ACETAMINOPHEN 160 MG/5ML PO SOLN
325.0000 mg | ORAL | Status: DC | PRN
  Filled 2018-03-24: qty 20.3

## 2018-03-24 MED ORDER — FENTANYL CITRATE (PF) 100 MCG/2ML IJ SOLN
INTRAMUSCULAR | Status: AC
  Administered 2018-03-24: 25 ug via INTRAVENOUS
  Filled 2018-03-24: qty 2

## 2018-03-24 MED ORDER — PROMETHAZINE HCL 25 MG/ML IJ SOLN: 6.2500 mg | INTRAMUSCULAR | Status: DC | PRN

## 2018-03-24 MED ORDER — NEOMYCIN-POLYMYXIN B GU 40-200000 IR SOLN
Status: DC | PRN
  Administered 2018-03-24: 2 mL

## 2018-03-24 MED ORDER — MEPERIDINE HCL 50 MG/ML IJ SOLN: 6.2500 mg | INTRAMUSCULAR | Status: DC | PRN

## 2018-03-24 SURGICAL SUPPLY — 47 items
BANDAGE ELASTIC 2 LF NS (GAUZE/BANDAGES/DRESSINGS) ×2 IMPLANT
BANDAGE ELASTIC 3 LF NS (GAUZE/BANDAGES/DRESSINGS) ×2 IMPLANT
BLADE MED AGGRESSIVE (BLADE) ×2 IMPLANT
BNDG ESMARK 4X12 TAN STRL LF (GAUZE/BANDAGES/DRESSINGS) ×2 IMPLANT
BUR 4X55 1 (BURR) ×2 IMPLANT
CANISTER SUCT 1200ML W/VALVE (MISCELLANEOUS) ×2 IMPLANT
CAST PADDING 2X4YD NS (MISCELLANEOUS) ×2 IMPLANT
CAST PADDING 2X4YD ST 30245 (MISCELLANEOUS) ×1
CAST PADDING 3X4FT ST 30246 (SOFTGOODS) ×1
CUFF TOURN 18 STER (MISCELLANEOUS) ×2 IMPLANT
DRAPE FLUOR MINI C-ARM 54X84 (DRAPES) IMPLANT
DRAPE INCISE IOBAN 66X45 STRL (DRAPES) ×2 IMPLANT
DRAPE SHEET LG 3/4 BI-LAMINATE (DRAPES) ×2 IMPLANT
DURAPREP 26ML APPLICATOR (WOUND CARE) ×2 IMPLANT
ELECT REM PT RETURN 9FT ADLT (ELECTROSURGICAL) ×2
ELECTRODE REM PT RTRN 9FT ADLT (ELECTROSURGICAL) ×1 IMPLANT
GAUZE PETRO XEROFOAM 1X8 (MISCELLANEOUS) ×2 IMPLANT
GAUZE SPONGE 4X4 12PLY STRL (GAUZE/BANDAGES/DRESSINGS) ×2 IMPLANT
GAUZE XEROFORM 4X4 STRL (GAUZE/BANDAGES/DRESSINGS) ×2 IMPLANT
GLOVE BIO SURGEON STRL SZ7.5 (GLOVE) ×2 IMPLANT
GLOVE BIO SURGEON STRL SZ8 (GLOVE) ×2 IMPLANT
GLOVE BIOGEL M STRL SZ7.5 (GLOVE) ×2 IMPLANT
GLOVE INDICATOR 8.0 STRL GRN (GLOVE) ×2 IMPLANT
GOWN STRL REUS W/ TWL LRG LVL3 (GOWN DISPOSABLE) ×1 IMPLANT
GOWN STRL REUS W/TWL LRG LVL3 (GOWN DISPOSABLE) ×1
GOWN STRL REUS W/TWL LRG LVL4 (GOWN DISPOSABLE) ×2 IMPLANT
KIT BIO-TENODESIS 3X8 DISP (MISCELLANEOUS) ×1
KIT INSRT BABSR STRL DISP BTN (MISCELLANEOUS) ×1 IMPLANT
KIT TURNOVER KIT A (KITS) ×2 IMPLANT
NS IRRIG 500ML POUR BTL (IV SOLUTION) ×2 IMPLANT
PACK EXTREMITY ARMC (MISCELLANEOUS) ×2 IMPLANT
PAD CAST CTTN 3X4 STRL (SOFTGOODS) ×1 IMPLANT
PADDING CAST COTTON 2X4 ST (MISCELLANEOUS) ×1 IMPLANT
SOL PREP PVP 2OZ (MISCELLANEOUS) ×2
SOLUTION PREP PVP 2OZ (MISCELLANEOUS) ×1 IMPLANT
SPLINT CAST 1 STEP 3X12 (MISCELLANEOUS) ×2 IMPLANT
STOCKINETTE STRL 4IN 9604848 (GAUZE/BANDAGES/DRESSINGS) ×2 IMPLANT
SUT ETH BLK MONO 3 0 FS 1 12/B (SUTURE) ×2 IMPLANT
SUT ETHILON 5-0 FS-2 18 BLK (SUTURE) ×4 IMPLANT
SUT VIC AB 0 SH 27 (SUTURE) ×2 IMPLANT
SUT VIC AB 2-0 SH 27 (SUTURE) ×3
SUT VIC AB 2-0 SH 27XBRD (SUTURE) ×3 IMPLANT
SUT VIC AB 3-0 SH 27 (SUTURE) ×1
SUT VIC AB 3-0 SH 27X BRD (SUTURE) ×1 IMPLANT
SYR 5ML 18GX1 1/2 (NEEDLE) ×2 IMPLANT
SYS LIGAMENT RECONST IMPLANT S (Miscellaneous) ×2 IMPLANT
SYSTEM LIGAMENT RCNST IMPLNT S (Miscellaneous) ×1 IMPLANT

## 2018-03-24 NOTE — Anesthesia Preprocedure Evaluation (Signed)
Anesthesia Evaluation  Patient identified by MRN, date of birth, ID band Patient awake    Reviewed: Allergy & Precautions, H&P , NPO status , reviewed documented beta blocker date and time   History of Anesthesia Complications (+) PONV and history of anesthetic complications  Airway Mallampati: II  TM Distance: >3 FB Neck ROM: full    Dental  (+) Partial Upper, Partial Lower   Pulmonary    Pulmonary exam normal        Cardiovascular hypertension, Normal cardiovascular exam     Neuro/Psych    GI/Hepatic neg GERD  ,  Endo/Other    Renal/GU      Musculoskeletal  (+) Arthritis , Osteoarthritis,    Abdominal   Peds  Hematology   Anesthesia Other Findings Past Medical History: No date: Arthritis 1994: Colon polyp No date: Complication of anesthesia     Comment:  nausea No date: Cystitis No date: Diffuse cystic mastopathy No date: Hypertension  Past Surgical History: No date: BREAST BIOPSY; Bilateral     Comment:  neg No date: BREAST CYST ASPIRATION; Right     Comment:  neg 1976: BREAST LUMPECTOMY; Right 2002: BREAST SURGERY; Left     Comment:  excision 2011, 09/2013: COLONOSCOPY July 2013: HAND SURGERY; Left     Comment:  thumb Dec 2015: LASER ABLATION; Bilateral     Comment:  Dr Wyn Quakerew 1994: POLYPECTOMY 1976: TUBAL LIGATION     Reproductive/Obstetrics                             Anesthesia Physical Anesthesia Plan  ASA: II  Anesthesia Plan: General   Post-op Pain Management:    Induction:   PONV Risk Score and Plan: 4 or greater and Treatment may vary due to age or medical condition, Midazolam, Ondansetron and Promethazine  Airway Management Planned:   Additional Equipment:   Intra-op Plan:   Post-operative Plan:   Informed Consent: I have reviewed the patients History and Physical, chart, labs and discussed the procedure including the risks, benefits and  alternatives for the proposed anesthesia with the patient or authorized representative who has indicated his/her understanding and acceptance.   Dental Advisory Given  Plan Discussed with: CRNA  Anesthesia Plan Comments:         Anesthesia Quick Evaluation

## 2018-03-24 NOTE — Discharge Instructions (Signed)
AMBULATORY SURGERY  DISCHARGE INSTRUCTIONS   1) The drugs that you were given will stay in your system until tomorrow so for the next 24 hours you should not:  A) Drive an automobile B) Make any legal decisions C) Drink any alcoholic beverage   2) You may resume regular meals tomorrow.  Today it is better to start with liquids and gradually work up to solid foods.  You may eat anything you prefer, but it is better to start with liquids, then soup and crackers, and gradually work up to solid foods.   3) Please notify your doctor immediately if you have any unusual bleeding, trouble breathing, redness and pain at the surgery site, drainage, fever, or pain not relieved by medication. 4)   5) Your post-operative visit with Dr.                                     is: Date:                        Time:    Please call to schedule your post-operative visit.  6) Additional Instructions:     Ice pack  Elevation  RTC in about 2 weeks (pt already has appointment made, 6/25.

## 2018-03-24 NOTE — Transfer of Care (Signed)
Immediate Anesthesia Transfer of Care Note  Patient: Chelsey [name]  Procedure(s) Performed: FINGER ARTHROPLASTY (CMC TENDON INTERPOSITIONAL ARTHROPLASTY) (Right Thumb)  Patient Location: PACU  Anesthesia Type:General  Level of Consciousness: awake, alert  and oriented  Airway & Oxygen Therapy: Patient Spontanous Breathing  Post-op Assessment: Post -op Vital signs reviewed and stable  Post vital signs: stable  Last Vitals:  Vitals Value Taken Time  BP 116/68 03/24/2018  2:54 PM  Temp    Pulse 57 03/24/2018  2:55 PM  Resp 19 03/24/2018  2:55 PM  SpO2 100 % 03/24/2018  2:55 PM  Vitals shown include unvalidated device data.  Last Pain:  Vitals:   03/24/18 1120  TempSrc: Tympanic         Complications: No apparent anesthesia complications

## 2018-03-24 NOTE — Op Note (Signed)
PATIENT:[name], Chelsey R.  PRE-OPERATIVE DIAGNOSIS: CMC joint arthritis right thumb  POST-OPERATIVE DIAGNOSIS:Same  PROCEDURE: Tendon interpositional arthroplasty CMC joint right thumb SURGEON: Erin SonsHarold Arlissa Monteverde, Montez HagemanJr., MD  ANESTHESIA:General   Operative procedure: Because of the long history of CMC joint arthritis referable to the right thumb the patient was brought in for a tendon interpositional arthroplasty on the right.  The patient was taken to the operating room.  Satisfactory general anesthesia was achieved.  The patient was placed in the supine position.  The patient was given Kefzol IV at the start of procedure. A tourniquet was applied to the right upper arm and the right upper extremity was prepped and draped in usual fashion for procedure about the hand. The right upper extremity was exsanguinated and the tourniquet was inflated  I made a short longitudinal incision over the dorsal aspect of the [facility] joint of the right thumb. Dissection was carried down bluntly to the joint capsule. Care was taken to protect the tendons and dorsal nerves. The capsule was divided in line with the incision. I reflected the capsule off of the trapezium and then removed the trapezium in its entirety by means of blunt and sharp dissection.  I then made about a centimeter and a half incision over the flexor carpi radialis tendon about 10 cm proximal to the wrist joint. I dissected down to the flexor carpi radialis tendon and divided the tendon. The tendon was then pulled into the trapezial space and then pulled from within the joint through an obliquely made hole in the base of the first metacarpal. The tendon was secured to the drill hole with a 4 mm Arthrex radiolucent interference screw. The remaining portion of the tendon was placed in the trapezial joint space. The tourniquet was released at this time.  It was up about 64 minutes. Bleeding was controlled with coagulation cautery. The capsule was  closed with 2-0 Vicryl and the subcutaneous tissue with 3-0 Vicryl and the skin with 5-0 nylon. The small volar forearm incision was  closed with 4-0 nylon.  Incidentally, the patient was given another gram of Kefzol IV.  A sterile dressing was applied followed by a fiberglass thumb spica splint. The patient was then awakened and transferred to a stretcher bed. The patient was taken to the recovery room in satisfactory condition. Blood loss was negligible.

## 2018-03-24 NOTE — Anesthesia Procedure Notes (Signed)
Procedure Name: LMA Insertion Date/Time: 03/24/2018 12:47 PM Performed by: Irving BurtonBachich, Carrah Eppolito, CRNA Pre-anesthesia Checklist: Patient identified, Emergency Drugs available, Suction available and Patient being monitored Patient Re-evaluated:Patient Re-evaluated prior to induction Oxygen Delivery Method: Circle system utilized Preoxygenation: Pre-oxygenation with 100% oxygen Induction Type: IV induction Ventilation: Mask ventilation without difficulty LMA: LMA inserted LMA Size: 3.5 Number of attempts: 1 Placement Confirmation: positive ETCO2 and breath sounds checked- equal and bilateral Tube secured with: Tape Dental Injury: Teeth and Oropharynx as per pre-operative assessment

## 2018-03-24 NOTE — Anesthesia Post-op Follow-up Note (Signed)
Anesthesia QCDR form completed.        

## 2018-03-24 NOTE — H&P (Signed)
  H and P reviewed. No changes. Uploaded at later date. 

## 2018-03-25 ENCOUNTER — Encounter: Payer: Self-pay | Admitting: Unknown Physician Specialty

## 2018-03-25 NOTE — Anesthesia Postprocedure Evaluation (Signed)
Anesthesia Post Note  Patient: Chelsey [name]  Procedure(s) Performed: FINGER ARTHROPLASTY (CMC TENDON INTERPOSITIONAL ARTHROPLASTY) (Right Thumb)  Patient location during evaluation: PACU Anesthesia Type: General Level of consciousness: awake and alert Pain management: pain level controlled Vital Signs Assessment: post-procedure vital signs reviewed and stable Respiratory status: spontaneous breathing, nonlabored ventilation, respiratory function stable and patient connected to nasal cannula oxygen Cardiovascular status: blood pressure returned to baseline and stable Postop Assessment: no apparent nausea or vomiting Anesthetic complications: no     Last Vitals:  Vitals:   03/24/18 1616 03/24/18 1646  BP: (!) 163/65 (!) 186/71  Pulse: 65 (!) 58  Resp: 16 16  Temp: (!) 36.1 C   SpO2: 98% 98%    Last Pain:  Vitals:   03/25/18 0833  TempSrc:   PainSc: 5                  Tison Leibold Garry Heater Trea Carnegie

## 2018-07-21 ENCOUNTER — Other Ambulatory Visit: Payer: Self-pay | Admitting: Family Medicine

## 2018-07-21 DIAGNOSIS — Z1231 Encounter for screening mammogram for malignant neoplasm of breast: Secondary | ICD-10-CM

## 2018-07-28 DIAGNOSIS — Z86010 Personal history of colonic polyps: Secondary | ICD-10-CM | POA: Insufficient documentation

## 2018-07-28 DIAGNOSIS — K5909 Other constipation: Secondary | ICD-10-CM | POA: Insufficient documentation

## 2018-08-17 ENCOUNTER — Ambulatory Visit
Admission: RE | Admit: 2018-08-17 | Discharge: 2018-08-17 | Disposition: A | Discharge status: Home / Self Care | Payer: Medicare Other | Source: Ambulatory Visit | Attending: Family Medicine | Admitting: Family Medicine

## 2018-08-17 DIAGNOSIS — Z1231 Encounter for screening mammogram for malignant neoplasm of breast: Secondary | ICD-10-CM | POA: Diagnosis present

## 2018-10-22 ENCOUNTER — Other Ambulatory Visit: Payer: Self-pay | Admitting: Orthopedic Surgery

## 2018-10-22 DIAGNOSIS — M25551 Pain in right hip: Principal | ICD-10-CM

## 2018-10-22 DIAGNOSIS — M1611 Unilateral primary osteoarthritis, right hip: Secondary | ICD-10-CM

## 2018-10-22 DIAGNOSIS — G8929 Other chronic pain: Secondary | ICD-10-CM

## 2018-10-25 ENCOUNTER — Telehealth: Payer: Self-pay | Admitting: Podiatry

## 2018-10-25 MED ORDER — ALUMINUM CHLORIDE 20 % EX SOLN: Freq: Every day | CUTANEOUS | 3 refills | Status: DC

## 2018-10-25 NOTE — Addendum Note (Signed)
Addended by: Alphia Kava D on: 10/25/2018 04:01 PM   Modules accepted: Orders

## 2018-10-25 NOTE — Telephone Encounter (Signed)
I'm calling to get a refill of Drysol 20%.

## 2018-10-25 NOTE — Telephone Encounter (Signed)
Please advise 

## 2018-10-25 NOTE — Telephone Encounter (Signed)
I informed pt of the DRysol refill and encouraged pt to make an appt if continues to have a problem.

## 2018-10-28 ENCOUNTER — Encounter: Payer: Self-pay | Admitting: *Deleted

## 2018-10-29 ENCOUNTER — Encounter: Payer: Self-pay | Admitting: *Deleted

## 2018-10-29 ENCOUNTER — Ambulatory Visit: Payer: Medicare Other | Admitting: Certified Registered"

## 2018-10-29 ENCOUNTER — Ambulatory Visit
Admission: RE | Admit: 2018-10-29 | Discharge: 2018-10-29 | Disposition: A | Discharge status: Home / Self Care | Payer: Medicare Other | Attending: Unknown Physician Specialty | Admitting: Unknown Physician Specialty

## 2018-10-29 ENCOUNTER — Encounter
Admission: RE | Disposition: A | Discharge status: Home / Self Care | Payer: Self-pay | Source: Home / Self Care | Attending: Unknown Physician Specialty

## 2018-10-29 DIAGNOSIS — Z86010 Personal history of colonic polyps: Secondary | ICD-10-CM | POA: Diagnosis not present

## 2018-10-29 DIAGNOSIS — Z1211 Encounter for screening for malignant neoplasm of colon: Secondary | ICD-10-CM | POA: Insufficient documentation

## 2018-10-29 DIAGNOSIS — Z79899 Other long term (current) drug therapy: Secondary | ICD-10-CM | POA: Insufficient documentation

## 2018-10-29 DIAGNOSIS — K640 First degree hemorrhoids: Secondary | ICD-10-CM | POA: Diagnosis not present

## 2018-10-29 DIAGNOSIS — E119 Type 2 diabetes mellitus without complications: Secondary | ICD-10-CM | POA: Diagnosis not present

## 2018-10-29 DIAGNOSIS — K5730 Diverticulosis of large intestine without perforation or abscess without bleeding: Secondary | ICD-10-CM | POA: Diagnosis not present

## 2018-10-29 DIAGNOSIS — Z7982 Long term (current) use of aspirin: Secondary | ICD-10-CM | POA: Insufficient documentation

## 2018-10-29 DIAGNOSIS — I10 Essential (primary) hypertension: Secondary | ICD-10-CM | POA: Diagnosis not present

## 2018-10-29 HISTORY — DX: Nausea with vomiting, unspecified: R11.2

## 2018-10-29 HISTORY — DX: Prediabetes: R73.03

## 2018-10-29 HISTORY — PX: COLONOSCOPY WITH PROPOFOL: SHX5780

## 2018-10-29 HISTORY — DX: Other specified postprocedural states: Z98.890

## 2018-10-29 HISTORY — DX: Type 2 diabetes mellitus without complications: E11.9

## 2018-10-29 SURGERY — COLONOSCOPY WITH PROPOFOL
Anesthesia: General

## 2018-10-29 MED ORDER — ONDANSETRON HCL 4 MG/2ML IJ SOLN
INTRAMUSCULAR | Status: AC
  Filled 2018-10-29: qty 2

## 2018-10-29 MED ORDER — PROPOFOL 500 MG/50ML IV EMUL
INTRAVENOUS | Status: DC | PRN
  Administered 2018-10-29: 100 ug/kg/min via INTRAVENOUS

## 2018-10-29 MED ORDER — PROPOFOL 10 MG/ML IV BOLUS
INTRAVENOUS | Status: AC
  Filled 2018-10-29: qty 40

## 2018-10-29 MED ORDER — SODIUM CHLORIDE 0.9 % IV SOLN: INTRAVENOUS | Status: DC

## 2018-10-29 MED ORDER — SODIUM CHLORIDE 0.9 % IV SOLN
INTRAVENOUS | Status: DC
  Administered 2018-10-29 (×2): via INTRAVENOUS

## 2018-10-29 MED ORDER — PROPOFOL 10 MG/ML IV BOLUS
INTRAVENOUS | Status: DC | PRN
  Administered 2018-10-29: 30 mg via INTRAVENOUS
  Administered 2018-10-29: 50 mg via INTRAVENOUS
  Administered 2018-10-29: 20 mg via INTRAVENOUS

## 2018-10-29 MED ORDER — ONDANSETRON HCL 4 MG/2ML IJ SOLN
INTRAMUSCULAR | Status: DC | PRN
  Administered 2018-10-29: 4 mg via INTRAVENOUS

## 2018-10-29 MED ORDER — LIDOCAINE HCL (CARDIAC) PF 100 MG/5ML IV SOSY
PREFILLED_SYRINGE | INTRAVENOUS | Status: DC | PRN
  Administered 2018-10-29: 60 mg via INTRATRACHEAL

## 2018-10-29 MED ORDER — GLYCOPYRROLATE 0.2 MG/ML IJ SOLN
INTRAMUSCULAR | Status: AC
  Filled 2018-10-29: qty 1

## 2018-10-29 MED ORDER — GLYCOPYRROLATE 0.2 MG/ML IJ SOLN
INTRAMUSCULAR | Status: DC | PRN
  Administered 2018-10-29: 0.2 mg via INTRAVENOUS

## 2018-10-29 MED ORDER — LIDOCAINE HCL (PF) 2 % IJ SOLN
INTRAMUSCULAR | Status: AC
  Filled 2018-10-29: qty 10

## 2018-10-29 NOTE — Anesthesia Post-op Follow-up Note (Signed)
Anesthesia QCDR form completed.        

## 2018-10-29 NOTE — Transfer of Care (Signed)
Immediate Anesthesia Transfer of Care Note  Patient: Chelsey [name]  Procedure(s) Performed: COLONOSCOPY WITH PROPOFOL (N/A )  Patient Location: Endoscopy Unit  Anesthesia Type:General  Level of Consciousness: awake  Airway & Oxygen Therapy: Patient Spontanous Breathing and Patient connected to nasal cannula oxygen  Post-op Assessment: Report given to RN and Post -op Vital signs reviewed and stable  Post vital signs: stable  Last Vitals:  Vitals Value Taken Time  BP 127/66 10/29/2018 12:17 PM  Temp 36.2 C 10/29/2018 12:17 PM  Pulse 71 10/29/2018 12:19 PM  Resp 12 10/29/2018 12:19 PM  SpO2 100 % 10/29/2018 12:19 PM  Vitals shown include unvalidated device data.  Last Pain:  Vitals:   10/29/18 1217  TempSrc: Tympanic  PainSc: 0-No pain         Complications: No apparent anesthesia complications

## 2018-10-29 NOTE — Anesthesia Postprocedure Evaluation (Signed)
Anesthesia Post Note  Patient: Jeniece R Segel  Procedure(s) Performed: COLONOSCOPY WITH PROPOFOL (N/A )  Patient location during evaluation: Endoscopy Anesthesia Type: General Level of consciousness: awake and alert and oriented Pain management: pain level controlled Vital Signs Assessment: post-procedure vital signs reviewed and stable Respiratory status: spontaneous breathing, nonlabored ventilation and respiratory function stable Cardiovascular status: blood pressure returned to baseline and stable Postop Assessment: no signs of nausea or vomiting Anesthetic complications: no     Last Vitals:  Vitals:   10/29/18 1237 10/29/18 1247  BP: (!) 145/69 (!) 155/74  Pulse: 65   Resp: 18   Temp:    SpO2: 100%     Last Pain:  Vitals:   10/29/18 1247  TempSrc:   PainSc: 0-No pain                 Khilee Hendricksen

## 2018-10-29 NOTE — Anesthesia Preprocedure Evaluation (Signed)
Anesthesia Evaluation  Patient identified by MRN, date of birth, ID band Patient awake    Reviewed: Allergy & Precautions, NPO status , Patient's Chart, lab work & pertinent test results  History of Anesthesia Complications (+) PONV and history of anesthetic complications  Airway Mallampati: II  TM Distance: >3 FB Neck ROM: Full    Dental  (+) Partial Lower, Partial Upper   Pulmonary neg pulmonary ROS, neg sleep apnea, neg COPD,    breath sounds clear to auscultation- rhonchi (-) wheezing      Cardiovascular hypertension, Pt. on medications (-) CAD, (-) Past MI, (-) Cardiac Stents and (-) CABG  Rhythm:Regular Rate:Normal - Systolic murmurs and - Diastolic murmurs    Neuro/Psych neg Seizures negative neurological ROS  negative psych ROS   GI/Hepatic negative GI ROS, Neg liver ROS,   Endo/Other  diabetes  Renal/GU negative Renal ROS     Musculoskeletal  (+) Arthritis ,   Abdominal (+) - obese,   Peds  Hematology negative hematology ROS (+)   Anesthesia Other Findings Past Medical History: No date: Arthritis 1994: Colon polyp No date: Complication of anesthesia     Comment:  nausea No date: Cystitis No date: Diabetes mellitus without complication (HCC) No date: Diffuse cystic mastopathy No date: Hypertension No date: PONV (postoperative nausea and vomiting) No date: Pre-diabetes   Reproductive/Obstetrics                             Anesthesia Physical Anesthesia Plan  ASA: II  Anesthesia Plan: General   Post-op Pain Management:    Induction: Intravenous  PONV Risk Score and Plan: 3 and Propofol infusion  Airway Management Planned: Natural Airway  Additional Equipment:   Intra-op Plan:   Post-operative Plan:   Informed Consent: I have reviewed the patients History and Physical, chart, labs and discussed the procedure including the risks, benefits and alternatives for  the proposed anesthesia with the patient or authorized representative who has indicated his/her understanding and acceptance.     Dental advisory given  Plan Discussed with: CRNA and Anesthesiologist  Anesthesia Plan Comments:         Anesthesia Quick Evaluation

## 2018-10-29 NOTE — Op Note (Signed)
[facility] Patient Name: Chelsey [name] Procedure Date: 10/29/2018 11:45 AM MRN: [id number] Account #: [id number] Date of Birth: 1945/12/03 Admit Type: Outpatient Age: [age] [facility] Gender: Female Note Status: Finalized Procedure:            Colonoscopy Indications:          High risk colon cancer surveillance: Personal history                        of colonic polyps Providers:            Scot Junobert T. Elliott, MD Referring MD:         Teena Iraniavid M. Terance HartBronstein, MD (Referring MD) Medicines:            Propofol per Anesthesia Complications:        No immediate complications. Procedure:            Pre-Anesthesia Assessment:                       - After reviewing the risks and benefits, the patient                        was deemed in satisfactory condition to undergo the                        procedure.                       After obtaining informed consent, the colonoscope was                        passed under direct vision. Throughout the procedure,                        the patient's blood pressure, pulse, and oxygen                        saturations were monitored continuously. The                        Colonoscope was introduced through the anus and                        advanced to the the cecum, identified by appendiceal                        orifice and ileocecal valve. The colonoscopy was                        performed without difficulty. The patient tolerated the                        procedure well. Findings:      Many small-mouthed diverticula were found in the sigmoid colon and       descending colon.      Internal hemorrhoids were found during endoscopy. The hemorrhoids were       small and Grade I (internal hemorrhoids that do not prolapse).      The exam was otherwise without abnormality. Impression:           - Diverticulosis in the sigmoid colon and in the  descending colon.            - Internal hemorrhoids.                       - The examination was otherwise normal.                       - No specimens collected. Recommendation:       - Repeat colonoscopy in 5 years for surveillance. Scot Jun, MD 10/29/2018 12:15:22 PM This report has been signed electronically. Number of Addenda: 0 Note Initiated On: 10/29/2018 11:45 AM Scope Withdrawal Time: 0 hours 12 minutes 20 seconds  Total Procedure Duration: 0 hours 19 minutes 43 seconds       [facility]

## 2018-10-29 NOTE — H&P (Signed)
Primary Care Physician:  Dorothey Baseman, MD Primary Gastroenterologist:  Dr. Mechele Collin  Pre-Procedure History & Physical: HPI:  Chelsey [name] is a 73 y.o. female is here for an colonoscopy.   Past Medical History:  Diagnosis Date  . Arthritis   . Colon polyp 1994  . Complication of anesthesia    nausea  . Cystitis   . Diabetes mellitus without complication (HCC)   . Diffuse cystic mastopathy   . Hypertension   . PONV (postoperative nausea and vomiting)   . Pre-diabetes     Past Surgical History:  Procedure Laterality Date  . BREAST BIOPSY Bilateral    neg  . BREAST CYST ASPIRATION Right    neg  . BREAST LUMPECTOMY Right 1976  . BREAST SURGERY Left 2002   excision  . COLONOSCOPY  2011, 09/2013  . FINGER ARTHROPLASTY Right 03/24/2018   Procedure: FINGER ARTHROPLASTY (CMC TENDON INTERPOSITIONAL ARTHROPLASTY);  Surgeon: Erin Sons, MD;  Location: [facility] ORS;  Service: Orthopedics;  Laterality: Right;  . HAND SURGERY Left July 2013   thumb  . LASER ABLATION Bilateral Dec 2015   Dr Wyn Quaker  . POLYPECTOMY  1994  . TUBAL LIGATION  1976    Prior to Admission medications   Medication Sig Start Date End Date Taking? Authorizing Provider  aluminum chloride (DRYSOL) 20 % external solution Apply topically at bedtime. 10/25/18  Yes Vivi Barrack, DPM  aspirin 81 MG tablet Take 162 mg by mouth at bedtime.    Yes [provider]  Calcium Carbonate-Vit D-Min (CALTRATE 600+D PLUS PO) Take 1 tablet by mouth 2 (two) times daily.    Yes [provider]  cholecalciferol (VITAMIN D) 1000 units tablet Take 1,000 Units by mouth 2 (two) times daily.   Yes [provider]  docusate sodium (COLACE) 100 MG capsule Take 200 mg by mouth at bedtime.   Yes [provider]  ECHINACEA PLUS PO Take 2 tablets by mouth daily.   Yes [provider]  hydrochlorothiazide (HYDRODIURIL) 25 MG tablet Take 12.5 mg by mouth daily.   Yes [provider]  ibuprofen (ADVIL,MOTRIN) 200 MG tablet Take 600 mg by mouth 2 (two) times daily as needed for headache or moderate pain.   Yes [provider]  Multiple Vitamins-Minerals (CENTRUM SILVER PO) Take 1 tablet by mouth daily.   Yes [provider]  Omega-3 Fatty Acids (FISH OIL) 1200 MG CAPS Take 1,200 mg by mouth 2 (two) times daily.   Yes [provider]  vitamin E 400 UNIT capsule Take 400 Units by mouth 2 (two) times daily.    Yes [provider]    Allergies as of 08/26/2018  . (No Known Allergies)    Family History  Problem Relation Age of Onset  . Cancer Father        lung  . Cancer Mother        colon  . Cancer Sister        lung  . Pulmonary fibrosis Brother   . Pulmonary fibrosis Sister   . Breast cancer Neg Hx     Social History   Socioeconomic History  . Marital status: Married    Spouse name: Not on file  . Number of children: Not on file  . Years of education: Not on file  . Highest education level: Not on file  Occupational History  . Not on file  Social Needs  . Financial resource strain: Not on file  . Food  insecurity:    Worry: Not on file    Inability: Not on file  . Transportation needs:    Medical: Not on file    Non-medical: Not on file  Tobacco Use  . Smoking status: Never Smoker  . Smokeless tobacco: Never Used  Substance and Sexual Activity  . Alcohol use: No  . Drug use: Never  . Sexual activity: Not on file  Lifestyle  . Physical activity:    Days per week: Not on file    Minutes per session: Not on file  . Stress: Not on file  Relationships  . Social connections:    Talks on phone: Not on file    Gets together: Not on file    Attends religious service: Not on file    Active member of club or organization: Not on file    Attends meetings of clubs or organizations: Not on file    Relationship status: Not on file  . Intimate partner violence:    Fear of current or ex partner: Not on  file    Emotionally abused: Not on file    Physically abused: Not on file    Forced sexual activity: Not on file  Other Topics Concern  . Not on file  Social History Narrative  . Not on file    Review of Systems: See HPI, otherwise negative ROS  Physical Exam: BP (!) 145/92   Pulse (!) 57   Temp (!) 96.6 F (35.9 C) (Tympanic)   Resp 18   Ht 5' 0.75" (1.543 m)   Wt 67.1 kg   SpO2 100%   BMI 28.19 kg/m  General:   Alert,  pleasant and cooperative in NAD Head:  Normocephalic and atraumatic. Neck:  Supple; no masses or thyromegaly. Lungs:  Clear throughout to auscultation.    Heart:  Regular rate and rhythm. Abdomen:  Soft, nontender and nondistended. Normal bowel sounds, without guarding, and without rebound.   Neurologic:  Alert and  oriented x4;  grossly normal neurologically.  Impression/Plan: Chelsey [name] is here for an colonoscopy to be performed for [facility] colon polyps.  Last colonoscopy was 09/16/2013 and no polyps found at that time.  Risks, benefits, limitations, and alternatives regarding  colonoscopy have been reviewed with the patient.  Questions have been answered.  All parties agreeable.   Lynnae Prude, MD  10/29/2018, 11:44 AM

## 2018-10-31 ENCOUNTER — Ambulatory Visit
Admission: RE | Admit: 2018-10-31 | Discharge: 2018-10-31 | Disposition: A | Discharge status: Home / Self Care | Payer: Medicare Other | Source: Ambulatory Visit | Attending: Orthopedic Surgery | Admitting: Orthopedic Surgery

## 2018-10-31 DIAGNOSIS — G8929 Other chronic pain: Secondary | ICD-10-CM | POA: Diagnosis present

## 2018-10-31 DIAGNOSIS — M1611 Unilateral primary osteoarthritis, right hip: Secondary | ICD-10-CM | POA: Diagnosis present

## 2018-10-31 DIAGNOSIS — M25551 Pain in right hip: Secondary | ICD-10-CM | POA: Diagnosis not present

## 2018-11-23 ENCOUNTER — Telehealth: Payer: Self-pay | Admitting: Podiatry

## 2018-11-23 NOTE — Telephone Encounter (Signed)
Left message requesting a call back.

## 2018-11-23 NOTE — Telephone Encounter (Signed)
Unable to speak with pt initial call was disconnected before pt answered. I recalled pt and the line was busy.

## 2018-11-23 NOTE — Telephone Encounter (Signed)
I'm calling with questions about the prescription I spoke to you about a few weeks ago. Please give me a call back. Thank you.

## 2018-11-24 NOTE — Telephone Encounter (Signed)
Pt states she has coldness in her feet when she applies the Drysol. I asked pt if the Drysol she has is liquid, if so the liquid has the cold sensation as if her feet had been wet. Pt states her feet always feel cold, I offered an appt to be evaluated for circulation. Pt accepted and I transferred to scheduler.

## 2018-11-24 NOTE — Telephone Encounter (Signed)
I missed your call yesterday and I wanted to ask the nurse a question.

## 2018-11-30 ENCOUNTER — Ambulatory Visit (INDEPENDENT_AMBULATORY_CARE_PROVIDER_SITE_OTHER): Payer: Medicare Other | Admitting: Podiatry

## 2018-11-30 ENCOUNTER — Encounter: Payer: Self-pay | Admitting: Podiatry

## 2018-11-30 DIAGNOSIS — M79676 Pain in unspecified toe(s): Secondary | ICD-10-CM

## 2018-11-30 DIAGNOSIS — B351 Tinea unguium: Secondary | ICD-10-CM | POA: Diagnosis not present

## 2018-12-02 NOTE — Progress Notes (Signed)
   SUBJECTIVE Patient presents to office today complaining of elongated, thickened nails that cause pain while ambulating in shoes. She is unable to trim her own nails. She states her feet feel cold when she uses the Drysol. She denies any pain but is concerned about circulation secondary to the feet always feeling cold. Patient is here for further evaluation and treatment.  Past Medical History:  Diagnosis Date  . Arthritis   . Colon polyp 1994  . Complication of anesthesia    nausea  . Cystitis   . Diabetes mellitus without complication (HCC)   . Diffuse cystic mastopathy   . Hypertension   . PONV (postoperative nausea and vomiting)   . Pre-diabetes     OBJECTIVE General Patient is awake, alert, and oriented x 3 and in no acute distress. Derm Skin is dry and supple bilateral. Negative open lesions or macerations. Remaining integument unremarkable. Nails are tender, long, thickened and dystrophic with subungual debris, consistent with onychomycosis, 1-5 bilateral. No signs of infection noted. Vasc  DP and PT pedal pulses palpable bilaterally. Temperature gradient within normal limits.  Neuro Epicritic and protective threshold sensation grossly intact bilaterally.  Musculoskeletal Exam No symptomatic pedal deformities noted bilateral. Muscular strength within normal limits.  ASSESSMENT 1. Onychodystrophic nails 1-5 bilateral with hyperkeratosis of nails.  2. Onychomycosis of nail due to dermatophyte bilateral 3. Pain in foot bilateral  PLAN OF CARE 1. Patient evaluated today.  2. Instructed to maintain good pedal hygiene and foot care.  3. Mechanical debridement of nails 1-5 bilaterally performed using a nail nipper. Filed with dremel without incident.  4. Discontinue using Drysol due to cold sensation in feet.  5. Recommended good shoe gear.  6. Continue using foot powder daily.  7. Return to clinic as needed.    Felecia Shelling, DPM [facility]  Dr. Felecia Shelling, DPM    [address]                                        [place], [place] [address]                Office [phone]  Fax [phone]

## 2019-01-31 DIAGNOSIS — G8929 Other chronic pain: Secondary | ICD-10-CM | POA: Insufficient documentation

## 2019-04-16 DIAGNOSIS — Z4689 Encounter for fitting and adjustment of other specified devices: Secondary | ICD-10-CM | POA: Insufficient documentation

## 2019-04-25 ENCOUNTER — Other Ambulatory Visit: Payer: Self-pay | Admitting: Orthopedic Surgery

## 2019-04-25 DIAGNOSIS — M25552 Pain in left hip: Secondary | ICD-10-CM

## 2019-04-25 DIAGNOSIS — M7062 Trochanteric bursitis, left hip: Secondary | ICD-10-CM

## 2019-05-09 ENCOUNTER — Other Ambulatory Visit: Payer: Self-pay

## 2019-05-09 ENCOUNTER — Ambulatory Visit
Admission: RE | Admit: 2019-05-09 | Discharge: 2019-05-09 | Disposition: A | Discharge status: Home / Self Care | Payer: Medicare Other | Source: Ambulatory Visit | Attending: Orthopedic Surgery | Admitting: Orthopedic Surgery

## 2019-05-09 DIAGNOSIS — M25552 Pain in left hip: Secondary | ICD-10-CM

## 2019-05-09 DIAGNOSIS — M7062 Trochanteric bursitis, left hip: Secondary | ICD-10-CM | POA: Diagnosis present

## 2019-06-13 ENCOUNTER — Encounter
Admission: RE | Admit: 2019-06-13 | Discharge: 2019-06-13 | Disposition: A | Discharge status: Home / Self Care | Payer: Medicare Other | Source: Ambulatory Visit | Attending: Orthopedic Surgery | Admitting: Orthopedic Surgery

## 2019-06-13 ENCOUNTER — Other Ambulatory Visit: Payer: Self-pay

## 2019-06-13 DIAGNOSIS — R9431 Abnormal electrocardiogram [ECG] [EKG]: Secondary | ICD-10-CM | POA: Diagnosis not present

## 2019-06-13 DIAGNOSIS — I10 Essential (primary) hypertension: Secondary | ICD-10-CM | POA: Insufficient documentation

## 2019-06-13 DIAGNOSIS — Z01818 Encounter for other preprocedural examination: Secondary | ICD-10-CM | POA: Insufficient documentation

## 2019-06-13 DIAGNOSIS — R001 Bradycardia, unspecified: Secondary | ICD-10-CM | POA: Diagnosis not present

## 2019-06-13 LAB — BASIC METABOLIC PANEL
Anion gap: 8 (ref 5–15)
BUN: 27 mg/dL — ABNORMAL HIGH (ref 8–23)
CO2: 29 mmol/L (ref 22–32)
Calcium: 9.7 mg/dL (ref 8.9–10.3)
Chloride: 98 mmol/L (ref 98–111)
Creatinine, Ser: 0.77 mg/dL (ref 0.44–1.00)
GFR calc Af Amer: >60 mL/min (ref >60)
GFR calc non Af Amer: >60 mL/min (ref >60)
Glucose, Bld: 116 mg/dL — ABNORMAL HIGH (ref 70–99)
Potassium: 3.5 mmol/L (ref 3.5–5.1)
Sodium: 135 mmol/L (ref 135–145)

## 2019-06-13 NOTE — Patient Instructions (Signed)
Your procedure is scheduled on: Friday 9/4 Report to Day Surgery.[facility] To find out your arrival time please call [phone] between 1PM - 3PM on Thurs 9/3.  Remember: Instructions that are not followed completely may result in serious medical risk,  up to and including death, or upon the discretion of your surgeon and anesthesiologist your  surgery may need to be rescheduled.     _X__ 1. Do not eat food after midnight the night before your procedure.                 No gum chewing or hard candies. You may drink clear liquids up to 2 hours                 before you are scheduled to arrive for your surgery- DO not drink clear                 liquids within 2 hours of the start of your surgery.                 Clear Liquids include:  water, apple juice without pulp, clear carbohydrate                 drink such as Clearfast of Gatorade, Black Coffee or Tea (Do not add                 anything to coffee or tea).  __X__2.  On the morning of surgery brush your teeth with toothpaste and water, you                may rinse your mouth with mouthwash if you wish.  Do not swallow any toothpaste of mouthwash.     ___ 3.  No Alcohol for 24 hours before or after surgery.   ___ 4.  Do Not Smoke or use e-cigarettes For 24 Hours Prior to Your Surgery.                 Do not use any chewable tobacco products for at least 6 hours prior to                 surgery.  ____  5.  Bring all medications with you on the day of surgery if instructed.   __x__  6.  Notify your doctor if there is any change in your medical condition      (cold, fever, infections).     Do not wear jewelry, make-up, hairpins, clips or nail polish. Do not wear lotions, powders, or perfumes. You may wear deodorant. Do not shave 48 hours prior to surgery. Men may shave face and neck. Do not bring valuables to the hospital.    [facility] is not responsible for any belongings or  valuables.  Contacts, dentures or bridgework may not be worn into surgery. Leave your suitcase in the car. After surgery it may be brought to your room. For patients admitted to the hospital, discharge time is determined by your treatment team.   Patients discharged the day of surgery will not be allowed to drive home.   Please read over the following fact sheets that you were given:     __x__ Take these medicines the morning of surgery with A SIP OF WATER:    1. none  2.   3.   4.  5.  6.  ____ Fleet Enema (as directed)   ____ Use CHG Soap as directed  ____ Use inhalers on the  day of surgery  ____ Stop metformin 2 days prior to surgery    ____ Take 1/2 of usual insulin dose the night before surgery. No insulin the morning          of surgery.   __x__ Stop aspirin today  __x__ Stop Anti-inflammatories  ibuprofen (ADVIL,MOTRIN) 200 MG tablet, aleve or aspirin   __x__ Stop supplements until after surgery. ECHINACEA PLUS PO,Omega-3 Fatty Acids (FISH OIL) 1200 MG CAPS, vitamin E 400 UNIT capsule   ____ Bring C-Pap to the hospital.

## 2019-06-14 ENCOUNTER — Other Ambulatory Visit
Admission: RE | Admit: 2019-06-14 | Discharge: 2019-06-14 | Disposition: A | Discharge status: Home / Self Care | Payer: Medicare Other | Source: Ambulatory Visit | Attending: Orthopedic Surgery | Admitting: Orthopedic Surgery

## 2019-06-14 DIAGNOSIS — Z01818 Encounter for other preprocedural examination: Secondary | ICD-10-CM | POA: Diagnosis not present

## 2019-06-14 LAB — SARS CORONAVIRUS 2 (TAT 6-24 HRS): SARS Coronavirus 2: NEGATIVE

## 2019-06-15 ENCOUNTER — Other Ambulatory Visit: Admission: RE | Admit: 2019-06-15 | Payer: Medicare Other | Source: Ambulatory Visit

## 2019-06-17 ENCOUNTER — Ambulatory Visit
Admission: RE | Admit: 2019-06-17 | Discharge: 2019-06-17 | Disposition: A | Discharge status: Home / Self Care | Payer: Medicare Other | Attending: Orthopedic Surgery | Admitting: Orthopedic Surgery

## 2019-06-17 ENCOUNTER — Encounter: Payer: Self-pay | Admitting: Certified Registered Nurse Anesthetist

## 2019-06-17 ENCOUNTER — Encounter: Payer: Self-pay | Admitting: *Deleted

## 2019-06-17 ENCOUNTER — Encounter
Admission: RE | Disposition: A | Discharge status: Home / Self Care | Payer: Self-pay | Source: Home / Self Care | Attending: Orthopedic Surgery

## 2019-06-17 ENCOUNTER — Other Ambulatory Visit: Payer: Self-pay

## 2019-06-17 DIAGNOSIS — K5909 Other constipation: Secondary | ICD-10-CM | POA: Insufficient documentation

## 2019-06-17 DIAGNOSIS — Z79899 Other long term (current) drug therapy: Secondary | ICD-10-CM | POA: Insufficient documentation

## 2019-06-17 DIAGNOSIS — I10 Essential (primary) hypertension: Secondary | ICD-10-CM | POA: Insufficient documentation

## 2019-06-17 DIAGNOSIS — Z7982 Long term (current) use of aspirin: Secondary | ICD-10-CM | POA: Diagnosis not present

## 2019-06-17 DIAGNOSIS — X509XXA Other and unspecified overexertion or strenuous movements or postures, initial encounter: Secondary | ICD-10-CM | POA: Insufficient documentation

## 2019-06-17 DIAGNOSIS — S76011A Strain of muscle, fascia and tendon of right hip, initial encounter: Secondary | ICD-10-CM | POA: Insufficient documentation

## 2019-06-17 DIAGNOSIS — J45909 Unspecified asthma, uncomplicated: Secondary | ICD-10-CM | POA: Diagnosis not present

## 2019-06-17 DIAGNOSIS — M7061 Trochanteric bursitis, right hip: Secondary | ICD-10-CM | POA: Diagnosis present

## 2019-06-17 DIAGNOSIS — M810 Age-related osteoporosis without current pathological fracture: Secondary | ICD-10-CM | POA: Insufficient documentation

## 2019-06-17 HISTORY — PX: INCISION AND DRAINAGE HIP: SHX1801

## 2019-06-17 LAB — GLUCOSE, CAPILLARY
Glucose-Capillary: 101 mg/dL — ABNORMAL HIGH (ref 70–99)
Glucose-Capillary: 131 mg/dL — ABNORMAL HIGH (ref 70–99)

## 2019-06-17 SURGERY — IRRIGATION AND DEBRIDEMENT HIP
Anesthesia: General | Site: Hip | Laterality: Right

## 2019-06-17 MED ORDER — ONDANSETRON HCL 4 MG/2ML IJ SOLN: 4.0000 mg | Freq: Once | INTRAMUSCULAR | Status: DC | PRN

## 2019-06-17 MED ORDER — PROPOFOL 10 MG/ML IV BOLUS
INTRAVENOUS | Status: DC | PRN
  Administered 2019-06-17: 120 mg via INTRAVENOUS

## 2019-06-17 MED ORDER — ONDANSETRON HCL 4 MG/2ML IJ SOLN
INTRAMUSCULAR | Status: DC | PRN
  Administered 2019-06-17: 4 mg via INTRAVENOUS

## 2019-06-17 MED ORDER — PHENYLEPHRINE HCL (PRESSORS) 10 MG/ML IV SOLN
INTRAVENOUS | Status: AC
  Filled 2019-06-17: qty 1

## 2019-06-17 MED ORDER — NEOMYCIN-POLYMYXIN B GU 40-200000 IR SOLN
Status: AC
  Filled 2019-06-17: qty 4

## 2019-06-17 MED ORDER — LIDOCAINE HCL (CARDIAC) PF 100 MG/5ML IV SOSY
PREFILLED_SYRINGE | INTRAVENOUS | Status: DC | PRN
  Administered 2019-06-17: 100 mg via INTRAVENOUS

## 2019-06-17 MED ORDER — NEOMYCIN-POLYMYXIN B GU 40-200000 IR SOLN
Status: DC | PRN
  Administered 2019-06-17: 4 mL

## 2019-06-17 MED ORDER — SUGAMMADEX SODIUM 200 MG/2ML IV SOLN
INTRAVENOUS | Status: DC | PRN
  Administered 2019-06-17: 120 mg via INTRAVENOUS

## 2019-06-17 MED ORDER — MIDAZOLAM HCL 2 MG/2ML IJ SOLN
INTRAMUSCULAR | Status: AC
  Filled 2019-06-17: qty 2

## 2019-06-17 MED ORDER — FAMOTIDINE 20 MG PO TABS
ORAL_TABLET | ORAL | Status: AC
  Administered 2019-06-17: 20 mg
  Filled 2019-06-17: qty 1

## 2019-06-17 MED ORDER — CEFAZOLIN SODIUM-DEXTROSE 2-4 GM/100ML-% IV SOLN
2.0000 g | Freq: Once | INTRAVENOUS | Status: AC
  Administered 2019-06-17: 2 g via INTRAVENOUS

## 2019-06-17 MED ORDER — SODIUM CHLORIDE 0.9 % IV SOLN
INTRAVENOUS | Status: DC
  Administered 2019-06-17: 09:00:00 via INTRAVENOUS

## 2019-06-17 MED ORDER — LIDOCAINE HCL (PF) 2 % IJ SOLN
INTRAMUSCULAR | Status: AC
  Filled 2019-06-17: qty 10

## 2019-06-17 MED ORDER — BUPIVACAINE-EPINEPHRINE (PF) 0.5% -1:200000 IJ SOLN
INTRAMUSCULAR | Status: DC | PRN
  Administered 2019-06-17: 30 mL

## 2019-06-17 MED ORDER — ONDANSETRON 4 MG PO TBDP: 4.0000 mg | ORAL_TABLET | Freq: Three times a day (TID) | ORAL | 0 refills | Status: DC | PRN

## 2019-06-17 MED ORDER — DEXAMETHASONE SODIUM PHOSPHATE 10 MG/ML IJ SOLN
INTRAMUSCULAR | Status: AC
  Filled 2019-06-17: qty 1

## 2019-06-17 MED ORDER — BUPIVACAINE LIPOSOME 1.3 % IJ SUSP
INTRAMUSCULAR | Status: AC
  Filled 2019-06-17: qty 20

## 2019-06-17 MED ORDER — MIDAZOLAM HCL 2 MG/2ML IJ SOLN
INTRAMUSCULAR | Status: DC | PRN
  Administered 2019-06-17: 1 mg via INTRAVENOUS

## 2019-06-17 MED ORDER — BUPIVACAINE-EPINEPHRINE (PF) 0.5% -1:200000 IJ SOLN
INTRAMUSCULAR | Status: AC
  Filled 2019-06-17: qty 30

## 2019-06-17 MED ORDER — ROCURONIUM BROMIDE 100 MG/10ML IV SOLN
INTRAVENOUS | Status: DC | PRN
  Administered 2019-06-17: 100 mg via INTRAVENOUS

## 2019-06-17 MED ORDER — NEOMYCIN-POLYMYXIN B GU 40-200000 IR SOLN
Status: AC
  Filled 2019-06-17: qty 1

## 2019-06-17 MED ORDER — CEFAZOLIN SODIUM-DEXTROSE 2-4 GM/100ML-% IV SOLN
INTRAVENOUS | Status: AC
  Filled 2019-06-17: qty 100

## 2019-06-17 MED ORDER — ROCURONIUM BROMIDE 50 MG/5ML IV SOLN
INTRAVENOUS | Status: AC
  Filled 2019-06-17: qty 1

## 2019-06-17 MED ORDER — PROPOFOL 10 MG/ML IV BOLUS
INTRAVENOUS | Status: AC
  Filled 2019-06-17: qty 20

## 2019-06-17 MED ORDER — ACETAMINOPHEN 500 MG PO TABS: 1000.0000 mg | ORAL_TABLET | Freq: Three times a day (TID) | ORAL | 2 refills | Status: DC

## 2019-06-17 MED ORDER — BUPIVACAINE LIPOSOME 1.3 % IJ SUSP
INTRAMUSCULAR | Status: DC | PRN
  Administered 2019-06-17: 20 mL

## 2019-06-17 MED ORDER — ASPIRIN EC 325 MG PO TBEC: 325.0000 mg | DELAYED_RELEASE_TABLET | Freq: Every day | ORAL | 0 refills | Status: AC

## 2019-06-17 MED ORDER — FENTANYL CITRATE (PF) 250 MCG/5ML IJ SOLN
INTRAMUSCULAR | Status: AC
  Filled 2019-06-17: qty 5

## 2019-06-17 MED ORDER — ONDANSETRON HCL 4 MG/2ML IJ SOLN
INTRAMUSCULAR | Status: AC
  Filled 2019-06-17: qty 2

## 2019-06-17 MED ORDER — DEXAMETHASONE SODIUM PHOSPHATE 4 MG/ML IJ SOLN
INTRAMUSCULAR | Status: DC | PRN
  Administered 2019-06-17: 5 mg via INTRAVENOUS

## 2019-06-17 MED ORDER — ACETAMINOPHEN 10 MG/ML IV SOLN
INTRAVENOUS | Status: DC | PRN
  Administered 2019-06-17: 1000 mg via INTRAVENOUS

## 2019-06-17 MED ORDER — ACETAMINOPHEN 10 MG/ML IV SOLN
INTRAVENOUS | Status: AC
  Filled 2019-06-17: qty 100

## 2019-06-17 MED ORDER — SUGAMMADEX SODIUM 200 MG/2ML IV SOLN
INTRAVENOUS | Status: AC
  Filled 2019-06-17: qty 2

## 2019-06-17 MED ORDER — GLYCOPYRROLATE 0.2 MG/ML IJ SOLN
INTRAMUSCULAR | Status: AC
  Filled 2019-06-17: qty 1

## 2019-06-17 MED ORDER — OXYCODONE HCL 5 MG PO TABS: 5.0000 mg | ORAL_TABLET | ORAL | 0 refills | Status: DC | PRN

## 2019-06-17 MED ORDER — FENTANYL CITRATE (PF) 100 MCG/2ML IJ SOLN: 25.0000 ug | INTRAMUSCULAR | Status: DC | PRN

## 2019-06-17 MED ORDER — FENTANYL CITRATE (PF) 100 MCG/2ML IJ SOLN
INTRAMUSCULAR | Status: DC | PRN
  Administered 2019-06-17 (×2): 50 ug via INTRAVENOUS
  Administered 2019-06-17: 100 ug via INTRAVENOUS

## 2019-06-17 SURGICAL SUPPLY — 49 items
ANCHOR ALL-SUT Q-FIX 1.8 BLUE (Anchor) ×1 IMPLANT
ANCHOR BONE REGENETEN (Anchor) ×1 IMPLANT
ANCHOR ICONIX SPEED 2.0 TAPE (Anchor) ×2 IMPLANT
ANCHOR TENDON REGENETEN (Staple) ×1 IMPLANT
CANISTER SUCT 1200ML W/VALVE (MISCELLANEOUS) ×2 IMPLANT
CANISTER SUCT 3000ML PPV (MISCELLANEOUS) ×2 IMPLANT
CHLORAPREP W/TINT 26 (MISCELLANEOUS) ×2 IMPLANT
COVER WAND RF STERILE (DRAPES) ×2 IMPLANT
DERMABOND ADVANCED (GAUZE/BANDAGES/DRESSINGS) ×1
DERMABOND ADVANCED .7 DNX12 (GAUZE/BANDAGES/DRESSINGS) IMPLANT
DRAPE 3/4 80X56 (DRAPES) ×2 IMPLANT
DRAPE INCISE IOBAN 66X60 STRL (DRAPES) ×2 IMPLANT
DRSG OPSITE POSTOP 4X8 (GAUZE/BANDAGES/DRESSINGS) ×1 IMPLANT
ELECT CAUTERY BLADE 6.4 (BLADE) ×2 IMPLANT
ELECT REM PT RETURN 9FT ADLT (ELECTROSURGICAL) ×2
ELECTRODE REM PT RTRN 9FT ADLT (ELECTROSURGICAL) ×1 IMPLANT
GAUZE SPONGE 4X4 12PLY STRL (GAUZE/BANDAGES/DRESSINGS) ×2 IMPLANT
GAUZE XEROFORM 1X8 LF (GAUZE/BANDAGES/DRESSINGS) ×2 IMPLANT
GLOVE BIOGEL PI IND STRL 8 (GLOVE) ×1 IMPLANT
GLOVE BIOGEL PI INDICATOR 8 (GLOVE) ×1
GLOVE SURG SYN 7.5  E (GLOVE) ×1
GLOVE SURG SYN 7.5 E (GLOVE) ×1 IMPLANT
GLOVE SURG SYN 7.5 PF PI (GLOVE) ×1 IMPLANT
GOWN STRL REUS W/ TWL LRG LVL3 (GOWN DISPOSABLE) ×2 IMPLANT
GOWN STRL REUS W/TWL LRG LVL3 (GOWN DISPOSABLE) ×2
KIT SUTURE 1.8 Q-FIX DISP (KITS) ×1 IMPLANT
KIT TURNOVER KIT A (KITS) ×2 IMPLANT
MANIFOLD NEPTUNE II (INSTRUMENTS) ×2 IMPLANT
MAT ABSORB  FLUID 56X50 GRAY (MISCELLANEOUS) ×1
MAT ABSORB FLUID 56X50 GRAY (MISCELLANEOUS) ×1 IMPLANT
NDL FILTER BLUNT 18X1 1/2 (NEEDLE) ×1 IMPLANT
NDL SAFETY ECLIPSE 18X1.5 (NEEDLE) ×1 IMPLANT
NEEDLE FILTER BLUNT 18X 1/2SAF (NEEDLE) ×1
NEEDLE FILTER BLUNT 18X1 1/2 (NEEDLE) ×1 IMPLANT
NEEDLE HYPO 18GX1.5 SHARP (NEEDLE) ×1
NS IRRIG 1000ML POUR BTL (IV SOLUTION) ×2 IMPLANT
PACK HIP PROSTHESIS (MISCELLANEOUS) ×2 IMPLANT
PASSER SUT FIRSTPASS SELF (INSTRUMENTS) ×1 IMPLANT
PENCIL SMOKE ULTRAEVAC 22 CON (MISCELLANEOUS) ×2 IMPLANT
STAPLER SKIN PROX 35W (STAPLE) ×2 IMPLANT
SUT MNCRL 4-0 (SUTURE) ×1
SUT MNCRL 4-0 27XMFL (SUTURE) ×1
SUT VIC AB 0 CT1 36 (SUTURE) ×2 IMPLANT
SUT VIC AB 2-0 CT1 (SUTURE) ×1 IMPLANT
SUT VIC AB 2-0 CT1 27 (SUTURE) ×1
SUT VIC AB 2-0 CT1 TAPERPNT 27 (SUTURE) ×1 IMPLANT
SUTURE MNCRL 4-0 27XMF (SUTURE) IMPLANT
SYR 10ML LL (SYRINGE) ×2 IMPLANT
TAPE MICROFOAM 4IN (TAPE) ×2 IMPLANT

## 2019-06-17 NOTE — Anesthesia Procedure Notes (Signed)
Procedure Name: Intubation Date/Time: 06/17/2019 9:20 AM Performed by: Bernardo Heater, CRNA Pre-anesthesia Checklist: Patient identified, Emergency Drugs available, Suction available and Patient being monitored Patient Re-evaluated:Patient Re-evaluated prior to induction Oxygen Delivery Method: Circle system utilized Preoxygenation: Pre-oxygenation with 100% oxygen Induction Type: IV induction Ventilation: Mask ventilation without difficulty Laryngoscope Size: Mac and 3 Grade View: Grade I Tube type: Oral Tube size: 7.0 mm Number of attempts: 1 Placement Confirmation: ETT inserted through vocal cords under direct vision,  positive ETCO2 and breath sounds checked- equal and bilateral Secured at: 21 cm Tube secured with: Tape Dental Injury: Teeth and Oropharynx as per pre-operative assessment

## 2019-06-17 NOTE — Anesthesia Preprocedure Evaluation (Signed)
Anesthesia Evaluation  Patient identified by MRN, date of birth, ID band Patient awake    Reviewed: Allergy & Precautions, H&P , NPO status , reviewed documented beta blocker date and time   History of Anesthesia Complications (+) PONV and history of anesthetic complications  Airway Mallampati: II  TM Distance: >3 FB Neck ROM: full    Dental  (+) Partial Upper, Partial Lower   Pulmonary neg pulmonary ROS,    Pulmonary exam normal        Cardiovascular hypertension, Normal cardiovascular exam     Neuro/Psych negative neurological ROS  negative psych ROS   GI/Hepatic negative GI ROS, Neg liver ROS, neg GERD  ,  Endo/Other  diabetes  Renal/GU negative Renal ROS  negative genitourinary   Musculoskeletal  (+) Arthritis , Osteoarthritis,    Abdominal   Peds negative pediatric ROS (+)  Hematology negative hematology ROS (+)   Anesthesia Other Findings Past Medical History: No date: Arthritis 1994: Colon polyp No date: Complication of anesthesia     Comment:  nausea No date: Cystitis No date: Diffuse cystic mastopathy No date: Hypertension  Past Surgical History: No date: BREAST BIOPSY; Bilateral     Comment:  neg No date: BREAST CYST ASPIRATION; Right     Comment:  neg 1976: BREAST LUMPECTOMY; Right 2002: BREAST SURGERY; Left     Comment:  excision 2011, 09/2013: COLONOSCOPY July 2013: HAND SURGERY; Left     Comment:  thumb Dec 2015: LASER ABLATION; Bilateral     Comment:  Dr Lucky Cowboy 1994: POLYPECTOMY 1976: TUBAL LIGATION     Reproductive/Obstetrics                             Anesthesia Physical  Anesthesia Plan  ASA: II  Anesthesia Plan: General   Post-op Pain Management:    Induction:   PONV Risk Score and Plan:   Airway Management Planned: Oral ETT  Additional Equipment:   Intra-op Plan:   Post-operative Plan: Extubation in OR  Informed Consent: I have  reviewed the patients History and Physical, chart, labs and discussed the procedure including the risks, benefits and alternatives for the proposed anesthesia with the patient or authorized representative who has indicated his/her understanding and acceptance.     Dental Advisory Given  Plan Discussed with: CRNA  Anesthesia Plan Comments:         Anesthesia Quick Evaluation

## 2019-06-17 NOTE — Anesthesia Post-op Follow-up Note (Signed)
Anesthesia QCDR form completed.        

## 2019-06-17 NOTE — Op Note (Signed)
DATE OF SURGERY:  06/17/2019   PREOPERATIVE DIAGNOSIS:  1. Right hip gluteus medius tear 2. Right hip trochanteric bursitis   POSTOPERATIVE DIAGNOSIS:  1. Right hip gluteus medius tear 2. Right hip trochanteric bursitis   PROCEDURE:  1. Right hip open gluteus medius repair and augmentation with Regeneten patch 2. Right hip open trochanteric bursectomy  SURGEON: Rosealee AlbeeSunny H. Halley Kincer, MD   ASSISTANTS: Sonny DandyJ. Todd Mundy, PA   EBL: 25cc  ANESTHESIA: Gen  IMPLANTS: - Iconix SPEED all suture anchor double loaded with 1.2 and 2.880mm tapes x 2 - [employer] Qfix mini anchor x 1 - [employer] Regeneten patch  INDICATION(S): The patient is a 73 y.o. female who presents with persistent hip pain and gait disturbance. The MRI revealed a gluteus medius tear.  The patient has failed all non-operative care to date including multiple corticosteroid injections, physical therapy and activity modification.  Please see the preoperative notes for further detail.   The patient elected to undergo the above mentioned procedure after detailed explanation of the expected outcomes and recovery path.   Informed consent was obtained outlining the expected benefits and possible risks of the surgery highlighted by failure of the repair to heal or to retear. Other risks include continued pain and other general risks of surgery such as blood clots, infection, bleeding and nerve injury.   OPERATIVE FINDINGS:  A tear of the undersurface of the gluteus medius (anterior fibers) was observed.  Posterior gluteus medius fibers were intact. Significant trochanteric bursitis.  OPERATIVE REPORT:   The patient was brought to the operating room and underwent anesthesia. The patient was transitioned to a lateral position on the pegboard. All bony prominences were padded.  The patient was prepped and draped in a sterile fashion.  Time-out was performed and landmarks were identified.  A 10cm incision was made laterally over the greater  trochanter inline with the abductor muscle.  Sharp dissection was carried out down to the IT band and blunt self retaining retractors were placed.  We incised the IT band and then placed a self retaining retractor deep.  The greater trochanter was visible and we then performed a thorough bursectomy using an electrocautery device such that the short external rotators were easily visible. The gluteus medius muscle and tendon were identified.  The area of torn gluteus medius was identified as there was almost a bubble over the anterior fibers as they inserted onto the trochanter.  This was incised longitudinally and the lateral facet of the greater trochanter was noted to have no tendon attachment. Degenerative tendon was debrided. We used a rongeur to create a bleeding bed at the insertion site of the abductor tendons after clearing the footprint of any remaining soft tissue.  Next we placed 2 Iconix SPEED  anchors that were double loaded with 2.0 mm tapes in the gluteus medius footprint on the greater trochanter.  We then used a fast pass suture passer to pass all of the sutures in a side-to-side fashion while capturing the more proximal tendon.  The tapes were tied, reducing the gluteus medius to its footprint.  There was still one small area where the tendon was not compressed against the footprint. A Qfix Mini all-suture anchor was placed and tied in a similar fashion to the prior anchors. This construct allowed for excellent re-approximation of the tendon to its footprint. It was stable with internal and external rotation of the hip.  Decision was made to further augment the repair with a Regeneten  patch.  Appropriate tendon staples and bone staples were placed, securing the patch over the footprint over the gluteus medius tendon.  The wound was then thoroughly irrigated.  Local anesthetic was injected.  We then closed the wound in a layered fashion with #0 vicryl in the IT band. Fat stitches were placed to  close the dead space. We closed the skin with simple interrupted inverted 2-0 Vicryl suture and then a running subcuticular suture using a 4-0 Monocryl for the skin.  Dermabond was placed to seal the wound.  Sterile dressing was applied.  Hip abduction brace was applied.  The patient was awakened from anesthesia without difficulty and transferred to PACU in stable condition.   Of note, assistance from a PA was essential to performing the surgery.  PA was present for the entire surgery.  PA assisted with patient positioning, retraction, instrumentation, and wound closure. The surgery would have been more difficult and had longer operative time without PA assistance.   POSTOPERATIVE PLAN:  FFWB x 6 weeks. Hip abduction brace x 6 weeks. ASA 325mg /day x 2 weeks for DVT ppx. Plan for discharge home. F/U in ~2 weeks. PT in 3-4 days.

## 2019-06-17 NOTE — H&P (Signed)
Paper H&P to be scanned into permanent record. H&P reviewed. No significant changes noted.  

## 2019-06-17 NOTE — Discharge Instructions (Signed)
Gluteus Medius Repair Post-Operative Instructions   1. Plan for 1st Physical Therapy visit in ~3-4 days.  2. If oozing from surgery site occurs after 5-7 days postoperative, please contact Dr. Serita Grit offices. 3. Icing is very important for the first 5-7 days postoperative, and ice is applied (ice packs or ice therapy) as often as possible or at least for 20-minute periods 3-4 times per day. Ice should not be applied directly on the skin. 4. Physical therapist will remove dressing 5. Showering is allowed on post-op day #5 if the wound is dry.  6.  Do not soak the hip in water in a bathtub or pool for ~4 weeks.  7. Driving is permitted after ~2 weeks if the narcotic pain medication is no longer being taken and you feel comfortable getting into and out of a car. Driving a manual car may take up to 4 weeks. 8. The anesthetic drugs used during your surgery may cause nausea for the first 24 hours. If nausea is encountered, drink only clear liquids (i.e. Sprite or 7-up). The only solids should be dry crackers or toast. If nausea and vomiting become severe or the patient shows sign of dehydration (lack of urination) please call the doctor or the surgicenter. 9. If you develop a fever (101.5), redness, or yellow/brown/green drainage from the surgical incision site, please call our office to arrange for an evaluation. 10. Enclosed are prescriptions for you to use post-operatively. 11. You will take aspirin (325 mg) daily for 2 weeks. This may lower the risk of a blood clot developing after surgery. Should severe calf pain occur or significant swelling of calf and ankle, please call the doctor. 12. Local anesthetics (i.e. Novocaine) are put into the incision after surgery. It is not uncommon for patients to encounter more pain on the first or second day after surgery. This is the time when swelling peaks. Taking pain medication before bedtime will assist in sleeping. It is important not to drink or drive while  taking narcotic medication.  You should resume your normal medications for other conditions the day after surgery. 13. Follow weight bearing instructions (Flat foot weight bearing) as advised at discharge. Crutches or walker may be necessary to assist walking. Extremity elevation for the first 72 hours is also encouraged to minimize swelling. 14.  If unexpected problems occur and you need to speak to the doctor, call the office.  ACTIVITY RESTRICTIONS AND BRACING: -Brace should be worn at night while sleeping and locked in extension (0 degrees). Push orange tab down to lock in extension. If it will not push down, then the brace is probably not completely extended and may still be a bit bent.  -When up and ambulating, your brace can be unlocked from 0-90 degrees while following your touch-down weightbearing restrictions with crutches -Avoid sitting with your hips flexed 90 degrees for more than 30 minutes at a time -Avoid excessive external rotation or the "Figure-4" position with your leg crossed -We will updated your restrictions at your first postoperative visit   PAIN MEDICATION:  1. Tylenol 1000mg  3x/day until no pain 2.   Oxycodone 1 to 2 tablets by mouth every 4 hours as needed.   3.   Do NOT take anti-inflammatory medications as they can interfere with healing. These include ibuprofen (Advil, Motrin), naproxen (Aleve), meloxicam (Mobic), etodolac, diclofenac, etc.  BLOOD CLOT PREVENTION: Aspirin 325mg  by mouth daily for 2 weeks  ANTI-NAUSEA (if applicable): Zofran 4mg  tabs, 1 tablet by mouth every 6 hours as  needed. *You will be given a prescription, but it is optional to fill it.*  STOOL SOFTENER: Docusate 100mg  twice daily while taking pain medication   Important Contact Information [phone] (Office phone number)   Post-operative Brace: Apply and remove the brace you received as you were instructed to at the time of fitting and as described in detail as the braces  instructions for use indicate.  Wear the brace for the period of time prescribed by your physician.  The brace can be cleaned with soap and water and allowed to air dry only.  Should the brace result in increased pain, decreased feeling (numbness/tingling), increased swelling or an overall worsening of your medical condition, please contact your doctor immediately.  If an emergency situation occurs as a result of wearing the brace after normal business hours, please dial 911 and seek immediate medical attention.  Let your doctor know if you have any further questions about the brace issued to you. Refer to the T-Scope Hip instructions for use if you have any questions regarding your hip brace.  [facility] Customer Care for Troubleshooting: [phone] 1. Wrap brace around waist and attach with velcro.  2. Pull back support with thumb loop and adhere to waist belt.  3.   Clip on thigh strap and  tighten with strapping on front of thigh as needed.

## 2019-06-17 NOTE — Anesthesia Postprocedure Evaluation (Signed)
Anesthesia Post Note  Patient: Chelsey [name]  Procedure(s) Performed: right open gluteus medius repair, it band release, trochanteric bursectomy (Right Hip)  Patient location during evaluation: PACU Anesthesia Type: General Level of consciousness: awake and alert Pain management: pain level controlled Vital Signs Assessment: post-procedure vital signs reviewed and stable Respiratory status: spontaneous breathing, nonlabored ventilation, respiratory function stable and patient connected to nasal cannula oxygen Cardiovascular status: blood pressure returned to baseline and stable Postop Assessment: no apparent nausea or vomiting Anesthetic complications: no     Last Vitals:  Vitals:   06/17/19 1231 06/17/19 1300  BP: (!) 176/77 (!) 167/67  Pulse: 68 60  Resp: 16 16  Temp:    SpO2: 97% 100%    Last Pain:  Vitals:   06/17/19 1300  TempSrc:   PainSc: 0-No pain                 Molli Barrows

## 2019-06-17 NOTE — Transfer of Care (Signed)
Immediate Anesthesia Transfer of Care Note  Patient: Chelsey [name]  Procedure(s) Performed: right open gluteus medius repair, it band release, trochanteric bursectomy (Right Hip)  Patient Location: PACU  Anesthesia Type:General  Level of Consciousness: drowsy and patient cooperative  Airway & Oxygen Therapy: Patient Spontanous Breathing and Patient connected to face mask oxygen  Post-op Assessment: report to RN, VSS  Post vital signs: Reviewed and stable  Last Vitals:  Vitals Value Taken Time  BP    Temp    Pulse    Resp 12 06/17/19 1129  SpO2 100 % 06/17/19 1129    Last Pain:  Vitals:   06/17/19 0811  TempSrc: Temporal  PainSc: 5       Patients Stated Pain Goal: 3 (82/42/35 3614)  Complications: No apparent anesthesia complications

## 2019-06-19 ENCOUNTER — Encounter: Payer: Self-pay | Admitting: Orthopedic Surgery

## 2019-07-19 ENCOUNTER — Emergency Department: Payer: Medicare Other

## 2019-07-19 ENCOUNTER — Other Ambulatory Visit: Payer: Self-pay

## 2019-07-19 ENCOUNTER — Inpatient Hospital Stay
Admission: EM | Admit: 2019-07-19 | Discharge: 2019-07-21 | DRG: 433 | Disposition: A | Discharge status: Home / Self Care | Payer: Medicare Other | Attending: Internal Medicine | Admitting: Internal Medicine

## 2019-07-19 DIAGNOSIS — E871 Hypo-osmolality and hyponatremia: Secondary | ICD-10-CM | POA: Diagnosis present

## 2019-07-19 DIAGNOSIS — Z86010 Personal history of colonic polyps: Secondary | ICD-10-CM | POA: Diagnosis not present

## 2019-07-19 DIAGNOSIS — Z7982 Long term (current) use of aspirin: Secondary | ICD-10-CM

## 2019-07-19 DIAGNOSIS — Z20828 Contact with and (suspected) exposure to other viral communicable diseases: Secondary | ICD-10-CM | POA: Diagnosis present

## 2019-07-19 DIAGNOSIS — K5790 Diverticulosis of intestine, part unspecified, without perforation or abscess without bleeding: Secondary | ICD-10-CM | POA: Diagnosis present

## 2019-07-19 DIAGNOSIS — E876 Hypokalemia: Secondary | ICD-10-CM | POA: Diagnosis present

## 2019-07-19 DIAGNOSIS — I10 Essential (primary) hypertension: Secondary | ICD-10-CM | POA: Diagnosis present

## 2019-07-19 DIAGNOSIS — E119 Type 2 diabetes mellitus without complications: Secondary | ICD-10-CM | POA: Diagnosis present

## 2019-07-19 DIAGNOSIS — Z23 Encounter for immunization: Secondary | ICD-10-CM | POA: Diagnosis present

## 2019-07-19 DIAGNOSIS — K7469 Other cirrhosis of liver: Principal | ICD-10-CM | POA: Diagnosis present

## 2019-07-19 DIAGNOSIS — K648 Other hemorrhoids: Secondary | ICD-10-CM | POA: Diagnosis present

## 2019-07-19 DIAGNOSIS — R7989 Other specified abnormal findings of blood chemistry: Secondary | ICD-10-CM

## 2019-07-19 DIAGNOSIS — Z79899 Other long term (current) drug therapy: Secondary | ICD-10-CM

## 2019-07-19 DIAGNOSIS — Z8719 Personal history of other diseases of the digestive system: Secondary | ICD-10-CM | POA: Diagnosis not present

## 2019-07-19 DIAGNOSIS — R17 Unspecified jaundice: Secondary | ICD-10-CM

## 2019-07-19 DIAGNOSIS — R7401 Elevation of levels of liver transaminase levels: Secondary | ICD-10-CM | POA: Diagnosis present

## 2019-07-19 LAB — PROTIME-INR
INR: 0.9 (ref 0.8–1.2)
Prothrombin Time: 12.3 seconds (ref 11.4–15.2)

## 2019-07-19 LAB — CBC
HCT: 38.7 % (ref 36.0–46.0)
Hemoglobin: 13.5 g/dL (ref 12.0–15.0)
MCH: 32.5 pg (ref 26.0–34.0)
MCHC: 34.9 g/dL (ref 30.0–36.0)
MCV: 93 fL (ref 80.0–100.0)
Platelets: 171 10*3/uL (ref 150–400)
RBC: 4.16 MIL/uL (ref 3.87–5.11)
RDW: 13.6 % (ref 11.5–15.5)
WBC: 6 10*3/uL (ref 4.0–10.5)
nRBC: 0 % (ref 0.0–0.2)

## 2019-07-19 LAB — URINALYSIS, COMPLETE (UACMP) WITH MICROSCOPIC
Bacteria, UA: NONE SEEN
Bilirubin Urine: NEGATIVE
Glucose, UA: NEGATIVE mg/dL
Hgb urine dipstick: NEGATIVE
Ketones, ur: NEGATIVE mg/dL
Nitrite: NEGATIVE
Protein, ur: NEGATIVE mg/dL
Specific Gravity, Urine: 1.012 (ref 1.005–1.030)
pH: 6 (ref 5.0–8.0)

## 2019-07-19 LAB — BASIC METABOLIC PANEL
Anion gap: 14 (ref 5–15)
BUN: 13 mg/dL (ref 8–23)
CO2: 29 mmol/L (ref 22–32)
Calcium: 10.4 mg/dL — ABNORMAL HIGH (ref 8.9–10.3)
Chloride: 87 mmol/L — ABNORMAL LOW (ref 98–111)
Creatinine, Ser: 0.37 mg/dL — ABNORMAL LOW (ref 0.44–1.00)
GFR calc Af Amer: >60 mL/min (ref >60)
GFR calc non Af Amer: >60 mL/min (ref >60)
Glucose, Bld: 111 mg/dL — ABNORMAL HIGH (ref 70–99)
Potassium: 3 mmol/L — ABNORMAL LOW (ref 3.5–5.1)
Sodium: 130 mmol/L — ABNORMAL LOW (ref 135–145)

## 2019-07-19 LAB — HEPATIC FUNCTION PANEL
ALT: 604 U/L — ABNORMAL HIGH (ref 0–44)
AST: 173 U/L — ABNORMAL HIGH (ref 15–41)
Albumin: 3.8 g/dL (ref 3.5–5.0)
Alkaline Phosphatase: 383 U/L — ABNORMAL HIGH (ref 38–126)
Bilirubin, Direct: 6.7 mg/dL — ABNORMAL HIGH (ref 0.0–0.2)
Indirect Bilirubin: 4.1 mg/dL — ABNORMAL HIGH (ref 0.3–0.9)
Total Bilirubin: 10.8 mg/dL — ABNORMAL HIGH (ref 0.3–1.2)
Total Protein: 7.2 g/dL (ref 6.5–8.1)

## 2019-07-19 LAB — LACTIC ACID, PLASMA: Lactic Acid, Venous: 1 mmol/L (ref 0.5–1.9)

## 2019-07-19 LAB — ACETAMINOPHEN LEVEL: Acetaminophen (Tylenol), Serum: <10 ug/mL — ABNORMAL LOW (ref 10–30)

## 2019-07-19 MED ORDER — ONDANSETRON HCL 4 MG/2ML IJ SOLN: 4.0000 mg | Freq: Four times a day (QID) | INTRAMUSCULAR | Status: DC | PRN

## 2019-07-19 MED ORDER — ACETYLCYSTEINE LOAD VIA INFUSION
150.0000 mg/kg | Freq: Once | INTRAVENOUS | Status: AC
  Administered 2019-07-19: 22:00:00 8850 mg via INTRAVENOUS
  Filled 2019-07-19: qty 222

## 2019-07-19 MED ORDER — ONDANSETRON HCL 4 MG PO TABS: 4.0000 mg | ORAL_TABLET | Freq: Four times a day (QID) | ORAL | Status: DC | PRN

## 2019-07-19 MED ORDER — IOHEXOL 9 MG/ML PO SOLN
500.0000 mL | ORAL | Status: AC
  Administered 2019-07-19 – 2019-07-20 (×2): 500 mL via ORAL

## 2019-07-19 MED ORDER — IBUPROFEN 400 MG PO TABS: 400.0000 mg | ORAL_TABLET | Freq: Four times a day (QID) | ORAL | Status: DC | PRN

## 2019-07-19 MED ORDER — SODIUM CHLORIDE 0.9% FLUSH: 3.0000 mL | Freq: Once | INTRAVENOUS | Status: DC

## 2019-07-19 MED ORDER — DIPHENHYDRAMINE HCL 25 MG PO CAPS: 25.0000 mg | ORAL_CAPSULE | Freq: Four times a day (QID) | ORAL | Status: DC | PRN

## 2019-07-19 MED ORDER — INFLUENZA VAC A&B SA ADJ QUAD 0.5 ML IM PRSY
0.5000 mL | PREFILLED_SYRINGE | INTRAMUSCULAR | Status: AC
  Administered 2019-07-21: 0.5 mL via INTRAMUSCULAR
  Filled 2019-07-19: qty 0.5

## 2019-07-19 MED ORDER — ENOXAPARIN SODIUM 40 MG/0.4ML ~~LOC~~ SOLN
40.0000 mg | SUBCUTANEOUS | Status: DC
  Filled 2019-07-19: qty 0.4

## 2019-07-19 MED ORDER — DEXTROSE 5 % IV SOLN
15.0000 mg/kg/h | INTRAVENOUS | Status: DC
  Administered 2019-07-19 – 2019-07-21 (×3): 15 mg/kg/h via INTRAVENOUS
  Filled 2019-07-19 (×5): qty 60

## 2019-07-19 MED ORDER — HYDRALAZINE HCL 20 MG/ML IJ SOLN: 10.0000 mg | INTRAMUSCULAR | Status: DC | PRN

## 2019-07-19 NOTE — ED Notes (Signed)
ED TO INPATIENT HANDOFF REPORT  ED Nurse Name and Phone #: Betti Cruz Name/Age/Gender Chelsey [name] 73 y.o. female Room/Bed: ED16A/ED16A  Code Status   Code Status: Not on file  Home/SNF/Other Home Patient oriented to: self, place, time and situation Is this baseline? Yes      Chief Complaint jaundice sent by [facility]  Triage Note Pt to the er from [facility] for dizziness, fatigue and possible jaundice and liver damage. Pt states this has been going on for 4 days.    Allergies No Known Allergies  Level of Care/Admitting Diagnosis ED Disposition    ED Disposition Condition Comment   Admit  Hospital Area: [facility] [[id number]]  Level of Care: Med-Surg [16]  Covid Evaluation: Asymptomatic Screening Protocol (No Symptoms)  Diagnosis: Transaminitis [161096]  Admitting Physician: Oralia Manis [[id number]]  Attending Physician: Anne Hahn, DAVID [phone]  Estimated length of stay: past midnight tomorrow  Certification:: I certify this patient will need inpatient services for at least 2 midnights  PT Class (Do Not Modify): Inpatient [101]  PT Acc Code (Do Not Modify): Private [1]       B Medical/Surgery History Past Medical History:  Diagnosis Date  . Arthritis   . Colon polyp 1994  . Complication of anesthesia    nausea  . Cystitis   . Diabetes mellitus without complication (HCC)   . Diffuse cystic mastopathy   . Hypertension   . PONV (postoperative nausea and vomiting)    in distant past  . Pre-diabetes    Past Surgical History:  Procedure Laterality Date  . BREAST BIOPSY Bilateral    neg  . BREAST CYST ASPIRATION Right    neg  . BREAST LUMPECTOMY Right 1976  . BREAST SURGERY Left 2002   excision  . COLONOSCOPY  2011, 09/2013  . COLONOSCOPY WITH PROPOFOL N/A 10/29/2018   Procedure: COLONOSCOPY WITH PROPOFOL;  Surgeon: Scot Jun, MD;  [facility]: [facility] ENDOSCOPY;  Service: Endoscopy;  Laterality: N/A;  . FINGER ARTHROPLASTY Right 03/24/2018    Procedure: FINGER ARTHROPLASTY (CMC TENDON INTERPOSITIONAL ARTHROPLASTY);  Surgeon: Erin Sons, MD;  Location: [facility] ORS;  Service: Orthopedics;  Laterality: Right;  . HAND SURGERY Left July 2013   thumb  . INCISION AND DRAINAGE HIP Right 06/17/2019   Procedure: right open gluteus medius repair, it band release, trochanteric bursectomy;  Surgeon: Signa Kell, MD;  Location: [facility] ORS;  Service: Orthopedics;  Laterality: Right;  . laparotomy     exploratory r/t liver  . LASER ABLATION Bilateral Dec 2015   Dr Wyn Quaker  . POLYPECTOMY  1994  . TUBAL LIGATION  1976     A IV Location/Drains/Wounds Patient Lines/Drains/Airways Status   Active Line/Drains/Airways    Name:   Placement date:   Placement time:   Site:   Days:   Peripheral IV 07/19/19 Left Arm   07/19/19    2000    Arm   less than 1   Incision (Closed) 03/24/18 Hand Right   03/24/18    1351     482   Incision (Closed) 06/17/19 Hip Right   06/17/19    1047     32          Intake/Output Last 24 hours No intake or output data in the 24 hours ending 07/19/19 2210  Labs/Imaging Results for orders placed or performed during the hospital encounter of 07/19/19 (from the past 48 hour(s))  Basic metabolic panel     Status: Abnormal   Collection Time:  07/19/19  5:55 PM  Result Value Ref Range   Sodium 130 (L) 135 - 145 mmol/L   Potassium 3.0 (L) 3.5 - 5.1 mmol/L   Chloride 87 (L) 98 - 111 mmol/L   CO2 29 22 - 32 mmol/L   Glucose, Bld 111 (H) 70 - 99 mg/dL   BUN 13 8 - 23 mg/dL   Creatinine, Ser 0.37 (L) 0.44 - 1.00 mg/dL    Comment: ICTERUS AT THIS LEVEL MAY AFFECT RESULT   Calcium 10.4 (H) 8.9 - 10.3 mg/dL   GFR calc non Af Amer >60 >60 mL/min   GFR calc Af Amer >60 >60 mL/min   Anion gap 14 5 - 15    Comment: Performed at [facility], [address] [place], [place] [address]  CBC     Status: None   Collection Time: 07/19/19  5:55 PM  Result Value Ref Range   WBC 6.0 4.0 - 10.5 K/uL   RBC 4.16 3.87 - 5.11  MIL/uL   Hemoglobin 13.5 12.0 - 15.0 g/dL   HCT 38.7 36.0 - 46.0 %   MCV 93.0 80.0 - 100.0 fL   MCH 32.5 26.0 - 34.0 pg   MCHC 34.9 30.0 - 36.0 g/dL   RDW 13.6 11.5 - 15.5 %   Platelets 171 150 - 400 K/uL   nRBC 0.0 0.0 - 0.2 %    Comment: Performed at [facility], [address] [place], [place] [address]  Hepatic function panel     Status: Abnormal   Collection Time: 07/19/19  5:55 PM  Result Value Ref Range   Total Protein 7.2 6.5 - 8.1 g/dL   Albumin 3.8 3.5 - 5.0 g/dL   AST 173 (H) 15 - 41 U/L   ALT 604 (H) 0 - 44 U/L    Comment: ICTERUS AT THIS LEVEL MAY AFFECT RESULT   Alkaline Phosphatase 383 (H) 38 - 126 U/L   Total Bilirubin 10.8 (H) 0.3 - 1.2 mg/dL   Bilirubin, Direct 6.7 (H) 0.0 - 0.2 mg/dL   Indirect Bilirubin 4.1 (H) 0.3 - 0.9 mg/dL    Comment: Performed at [facility], [address] [place], [place] [address]  Urinalysis, Complete w Microscopic     Status: Abnormal   Collection Time: 07/19/19  7:58 PM  Result Value Ref Range   Color, Urine AMBER (A) YELLOW    Comment: BIOCHEMICALS MAY BE AFFECTED BY COLOR   APPearance CLEAR (A) CLEAR   Specific Gravity, Urine 1.012 1.005 - 1.030   pH 6.0 5.0 - 8.0   Glucose, UA NEGATIVE NEGATIVE mg/dL   Hgb urine dipstick NEGATIVE NEGATIVE   Bilirubin Urine NEGATIVE NEGATIVE   Ketones, ur NEGATIVE NEGATIVE mg/dL   Protein, ur NEGATIVE NEGATIVE mg/dL   Nitrite NEGATIVE NEGATIVE   Leukocytes,Ua TRACE (A) NEGATIVE   RBC / HPF 0-5 0 - 5 RBC/hpf   WBC, UA 0-5 0 - 5 WBC/hpf   Bacteria, UA NONE SEEN NONE SEEN   Squamous Epithelial / LPF 0-5 0 - 5   Ca Oxalate Crys, UA PRESENT     Comment: Performed at [facility], [address] [place], [place] [address]  Lactic acid, plasma     Status: None   Collection Time: 07/19/19  8:20 PM  Result Value Ref Range   Lactic Acid, Venous 1.0 0.5 - 1.9 mmol/L    Comment: ICTERUS AT THIS LEVEL MAY AFFECT RESULT Performed at [facility], [address] [place], [place] [address]   Protime-INR     Status: None   Collection Time: 07/19/19  8:20 PM  Result Value Ref Range   Prothrombin Time 12.3 11.4 - 15.2 seconds   INR 0.9 0.8 - 1.2    Comment: (NOTE) INR goal varies based on device and disease states. Performed at [facility], [address] [place], [place] [address]   Acetaminophen level     Status: Abnormal   Collection Time: 07/19/19  9:01 PM  Result Value Ref Range   Acetaminophen (Tylenol), Serum <10 (L) 10 - 30 ug/mL    Comment: (NOTE) Therapeutic concentrations vary significantly. A range of 10-30 ug/mL  may be an effective concentration for many patients. However, some  are best treated at concentrations outside of this range. Acetaminophen concentrations >150 ug/mL at 4 hours after ingestion  and >50 ug/mL at 12 hours after ingestion are often associated with  toxic reactions. Performed at [facility], [address] [place], [place] [address]    US Abdomen Limited Ruq  Result Date: 07/19/2019 CLINICAL DATA:  Jaundice, elevated liver function test. EXAM: ULTRASOUND ABDOMEN LIMITED RIGHT UPPER QUADRANT COMPARISON:  None. FINDINGS: Gallbladder: The gallbladder is partially contracted. No sonographic Eulah Pont sign is reported by the ultrasound technologist. No gallstone is noted. Common bile duct: Diameter: 4 mm Liver: No focal liver lesion is identified. There is nodular border of the liver. Mild increased echotexture of the liver is noted. Portal vein is patent on color Doppler imaging with normal direction of blood flow towards the liver. Other: None. IMPRESSION: Nodular border of liver with mild increased echotexture of liver. The findings are nonspecific. This can be seen in cirrhosis of liver. Electronically Signed   By: Sherian Rein M.D.   On: 07/19/2019 21:03    Pending Labs Unresulted Labs (From admission, onward)    Start     Ordered   07/19/19 2130  SARS CORONAVIRUS 2 (TAT 6-24  HRS) Nasopharyngeal Nasopharyngeal Swab  (Asymptomatic/Tier 2 Patients Labs)  Once,   STAT    Question Answer Comment  Is this test for diagnosis or screening Screening   Symptomatic for COVID-19 as defined by CDC No   Hospitalized for COVID-19 No   Admitted to ICU for COVID-19 No   Previously tested for COVID-19 Yes   Resident in a congregate (group) care setting No   Employed in healthcare setting No   Pregnant No      07/19/19 2129   07/19/19 1947  Hepatitis panel, acute  Once,   STAT     07/19/19 1947   Signed and Held  CBC  (enoxaparin (LOVENOX)    CrCl >/= 30 ml/min)  Once,   R    Comments: Baseline for enoxaparin therapy IF NOT ALREADY DRAWN.  Notify MD if PLT < 100 K.    Signed and Held   Signed and Held  Creatinine, serum  (enoxaparin (LOVENOX)    CrCl >/= 30 ml/min)  Once,   R    Comments: Baseline for enoxaparin therapy IF NOT ALREADY DRAWN.    Signed and Held   Signed and Held  Creatinine, serum  (enoxaparin (LOVENOX)    CrCl >/= 30 ml/min)  Weekly,   R    Comments: while on enoxaparin therapy    Signed and Held   Signed and Held  Comprehensive metabolic panel  Tomorrow morning,   R     Signed and Held   Signed and Held  CBC  Tomorrow morning,   R     Signed and Held          Vitals/Pain Today's Vitals   07/19/19 2030 07/19/19 2100 07/19/19 2130 07/19/19 2200  BP: (!) 164/78 (!) 175/77 (!) 197/86 (!) 182/77  Pulse: 61 65 62 62  Resp:      Temp:      SpO2: 100% 98% 100% 97%  Weight:      Height:      PainSc:        Isolation Precautions No active isolations  Medications Medications  sodium chloride flush (NS) 0.9 % injection 3 mL (has no administration in time range)  acetylcysteine (ACETADOTE) 40 mg/mL load via infusion 8,850 mg (8,850 mg Intravenous Bolus from Bag 07/19/19 2149)  acetylcysteine (ACETADOTE) 12,000 mg in dextrose 5 % 300 mL (40 mg/mL) infusion (15 mg/kg/hr  59 kg Intravenous New Bag/Given 07/19/19 2148)    Mobility walks with  person assist Low fall risk   Focused Assessments Pulmonary Assessment Handoff:  Lung sounds:   O2 Device: Room Air        R Recommendations: See Admitting Provider Note  Report given to:   Additional Notes:

## 2019-07-19 NOTE — H&P (Signed)
[facility] - [place] at [facility]   PATIENT NAME: Chelsey [name]    MR#:  [id number]  DATE OF BIRTH:  1946/09/11  DATE OF ADMISSION:  07/19/2019  PRIMARY CARE PHYSICIAN: Dorothey BasemanBronstein, Adalberto Metzgar, MD   REQUESTING/REFERRING PHYSICIAN: Darnelle CatalanMalinda, MD  CHIEF COMPLAINT:   Chief Complaint  Patient presents with  . Dizziness    HISTORY OF PRESENT ILLNESS:  Chelsey [name]  is a 73 y.o. female who presents with chief complaint as above.  Patient presents the ED with a complaint of jaundice.  She states that she has been taking up to 2 g of Tylenol every day for some time now.  On evaluation here in the ED she is found on right upper quadrant ultrasound to have some liver changes consistent with cirrhosis, but does not show any other acute abnormality.  She also has transaminitis.  Hospitalist called for admission  PAST MEDICAL HISTORY:   Past Medical History:  Diagnosis Date  . Arthritis   . Colon polyp 1994  . Complication of anesthesia    nausea  . Cystitis   . Diabetes mellitus without complication (HCC)   . Diffuse cystic mastopathy   . Hypertension   . PONV (postoperative nausea and vomiting)    in distant past  . Pre-diabetes      PAST SURGICAL HISTORY:   Past Surgical History:  Procedure Laterality Date  . BREAST BIOPSY Bilateral    neg  . BREAST CYST ASPIRATION Right    neg  . BREAST LUMPECTOMY Right 1976  . BREAST SURGERY Left 2002   excision  . COLONOSCOPY  2011, 09/2013  . COLONOSCOPY WITH PROPOFOL N/A 10/29/2018   Procedure: COLONOSCOPY WITH PROPOFOL;  Surgeon: Scot JunElliott, Robert T, MD;  Location: [facility] ENDOSCOPY;  Service: Endoscopy;  Laterality: N/A;  . FINGER ARTHROPLASTY Right 03/24/2018   Procedure: FINGER ARTHROPLASTY (CMC TENDON INTERPOSITIONAL ARTHROPLASTY);  Surgeon: Erin SonsKernodle, Harold, MD;  Location: [facility] ORS;  Service: Orthopedics;  Laterality: Right;  . HAND SURGERY Left July 2013   thumb  . INCISION AND DRAINAGE HIP Right  06/17/2019   Procedure: right open gluteus medius repair, it band release, trochanteric bursectomy;  Surgeon: Signa KellPatel, Sunny, MD;  Location: [facility] ORS;  Service: Orthopedics;  Laterality: Right;  . laparotomy     exploratory r/t liver  . LASER ABLATION Bilateral Dec 2015   Dr Wyn Quakerew  . POLYPECTOMY  1994  . TUBAL LIGATION  1976     SOCIAL HISTORY:   Social History   Tobacco Use  . Smoking status: Never Smoker  . Smokeless tobacco: Never Used  Substance Use Topics  . Alcohol use: No     FAMILY HISTORY:   Family History  Problem Relation Age of Onset  . Cancer Father        lung  . Cancer Mother        colon  . Cancer Sister        lung  . Pulmonary fibrosis Brother   . Pulmonary fibrosis Sister   . Breast cancer Neg Hx      DRUG ALLERGIES:  No Known Allergies  MEDICATIONS AT HOME:   Prior to Admission medications   Medication Sig Start Date End Date Taking? Authorizing Provider  acetaminophen (TYLENOL) 500 MG tablet Take 2 tablets (1,000 mg total) by mouth every 8 (eight) hours. 06/17/19 06/16/20  Signa KellPatel, Sunny, MD  Calcium Carbonate-Vit D-Min (CALTRATE 600+D PLUS PO) Take 1 tablet by mouth 2 (two) times daily.     [provider]  cholecalciferol (VITAMIN D) 1000 units tablet Take 1,000 Units by mouth 2 (two) times daily.    [provider]  docusate sodium (COLACE) 100 MG capsule Take 200 mg by mouth at bedtime.    [provider]  hydrochlorothiazide (HYDRODIURIL) 25 MG tablet Take 12.5 mg by mouth daily.    [provider]  Multiple Vitamins-Minerals (CENTRUM SILVER PO) Take 1 tablet by mouth daily.    [provider]  Omega-3 Fatty Acids (FISH OIL) 1200 MG CAPS Take 1,200 mg by mouth 2 (two) times daily.    [provider]  ondansetron (ZOFRAN ODT) 4 MG disintegrating tablet Take 1 tablet (4 mg total) by mouth every 8 (eight) hours as needed for nausea or vomiting. 06/17/19   Leim Fabry, MD  oxyCODONE (ROXICODONE) 5 MG  immediate release tablet Take 1-2 tablets (5-10 mg total) by mouth every 4 (four) hours as needed (pain). 06/17/19 06/16/20  Leim Fabry, MD  Propylene Glycol (SYSTANE COMPLETE OP) Place 1 drop into both eyes 2 (two) times daily.    [provider]  vitamin E 400 UNIT capsule Take 400 Units by mouth 2 (two) times daily.     [provider]    REVIEW OF SYSTEMS:  Review of Systems  Constitutional: Negative for chills, fever, malaise/fatigue and weight loss.  HENT: Negative for ear pain, hearing loss and tinnitus.   Eyes: Negative for blurred vision, double vision, pain and redness.  Respiratory: Negative for cough, hemoptysis and shortness of breath.   Cardiovascular: Negative for chest pain, palpitations, orthopnea and leg swelling.  Gastrointestinal: Negative for abdominal pain, constipation, diarrhea, nausea and vomiting.  Genitourinary: Negative for dysuria, frequency and hematuria.  Musculoskeletal: Negative for back pain, joint pain and neck pain.  Skin:       Jaundice  Neurological: Negative for dizziness, tremors, focal weakness and weakness.  Endo/Heme/Allergies: Negative for polydipsia. Does not bruise/bleed easily.  Psychiatric/Behavioral: Negative for depression. The patient is not nervous/anxious and does not have insomnia.      VITAL SIGNS:   Vitals:   07/19/19 2013 07/19/19 2030 07/19/19 2100 07/19/19 2130  BP: (!) 185/77 (!) 164/78 (!) 175/77 (!) 197/86  Pulse: 62 61 65 62  Resp:      Temp:      SpO2: 100% 100% 98% 100%  Weight:      Height:       Wt Readings from Last 3 Encounters:  07/19/19 59 kg  06/13/19 59.1 kg  10/29/18 67.1 kg    PHYSICAL EXAMINATION:  Physical Exam  Vitals reviewed. Constitutional: She is oriented to person, place, and time. She appears well-developed and well-nourished. No distress.  HENT:  Head: Normocephalic and atraumatic.  Mouth/Throat: Oropharynx is clear and moist.  Eyes: Pupils are equal, round, and reactive  to light. Conjunctivae and EOM are normal. Scleral icterus is present.  Neck: Normal range of motion. Neck supple. No JVD present. No thyromegaly present.  Cardiovascular: Normal rate, regular rhythm and intact distal pulses. Exam reveals no gallop and no friction rub.  No murmur heard. Respiratory: Effort normal and breath sounds normal. No respiratory distress. She has no wheezes. She has no rales.  GI: Soft. Bowel sounds are normal. She exhibits no distension. There is no abdominal tenderness.  Musculoskeletal: Normal range of motion.        General: No edema.     Comments: No arthritis, no gout  Lymphadenopathy:    She has no cervical adenopathy.  Neurological: She is alert and oriented to person, place, and time. No cranial nerve deficit.  No dysarthria, no aphasia  Skin: Skin is warm and dry. No rash noted. No erythema.  Psychiatric: She has a normal mood and affect. Her behavior is normal. Judgment and thought content normal.    LABORATORY PANEL:   CBC Recent Labs  Lab 07/19/19 1755  WBC 6.0  HGB 13.5  HCT 38.7  PLT 171   ------------------------------------------------------------------------------------------------------------------  Chemistries  Recent Labs  Lab 07/19/19 1755  NA 130*  K 3.0*  CL 87*  CO2 29  GLUCOSE 111*  BUN 13  CREATININE 0.37*  CALCIUM 10.4*  AST 173*  ALT 604*  ALKPHOS 383*  BILITOT 10.8*   ------------------------------------------------------------------------------------------------------------------  Cardiac Enzymes No results for input(s): TROPONINI in the last 168 hours. ------------------------------------------------------------------------------------------------------------------  RADIOLOGY:  US Abdomen Limited Ruq  Result Date: 07/19/2019 CLINICAL DATA:  Jaundice, elevated liver function test. EXAM: ULTRASOUND ABDOMEN LIMITED RIGHT UPPER QUADRANT COMPARISON:  None. FINDINGS: Gallbladder: The gallbladder is partially  contracted. No sonographic Eulah Pont sign is reported by the ultrasound technologist. No gallstone is noted. Common bile duct: Diameter: 4 mm Liver: No focal liver lesion is identified. There is nodular border of the liver. Mild increased echotexture of the liver is noted. Portal vein is patent on color Doppler imaging with normal direction of blood flow towards the liver. Other: None. IMPRESSION: Nodular border of liver with mild increased echotexture of liver. The findings are nonspecific. This can be seen in cirrhosis of liver. Electronically Signed   By: Sherian Rein M.D.   On: 07/19/2019 21:03    EKG:   Orders placed or performed during the hospital encounter of 07/19/19  . ED EKG  . ED EKG  . EKG 12-Lead  . EKG 12-Lead    IMPRESSION AND PLAN:  Principal Problem:   Transaminitis -with hyperbilirubinemia as below.  Ultrasound is suggestive possibly of cirrhosis, though the patient does not drink alcohol, so etiology of her cirrhosis would be unclear at this time.  We will get CT abdomen pelvis with contrast to evaluate for any other possible abnormality.  GI consult placed.  Hepatitis panel pending. Active Problems:   Uncontrolled hypertension -home dose antihypertensives, additional PRN antihypertensives to control her blood pressure to goal of less than 160/100   Hyperbilirubinemia -likely in conjunction with what ever is causing her transaminitis and liver changes.  See above for evaluation  Chart review performed and case discussed with ED provider. Labs, imaging and/or ECG reviewed by provider and discussed with patient/family. Management plans discussed with the patient and/or family.  COVID-19 status: Pending  DVT PROPHYLAXIS: SubQ lovenox   GI PROPHYLAXIS:  None  ADMISSION STATUS: Inpatient     CODE STATUS: Full  TOTAL TIME TAKING CARE OF THIS PATIENT: 45 minutes.   This patient was evaluated in the context of the global COVID-19 pandemic, which necessitated consideration  that the patient might be at risk for infection with the SARS-CoV-2 virus that causes COVID-19. Institutional protocols and algorithms that pertain to the evaluation of patients at risk for COVID-19 are in a state of rapid change based on information released by regulatory bodies including the CDC and federal and state organizations. These policies and algorithms were followed to the best of this provider's knowledge to date during the patient's care at this facility.  Barney Drain 07/19/2019, 10:08 PM  [facility]  Office  [phone]  CC: Primary care physician; Dorothey Baseman, MD  Note:  This document was prepared using Dragon voice recognition software and may include unintentional dictation errors.

## 2019-07-19 NOTE — ED Notes (Signed)
Patient informed this RN to call when able to provide a urine sample

## 2019-07-19 NOTE — Progress Notes (Signed)
MEDICATION RELATED CONSULT NOTE - INITIAL   Pharmacy Consult for Acetylcysteine  Indication: APAP overdose   No Known Allergies  Patient Measurements: Height: 5' 0.75" (154.3 cm) Weight: 130 lb (59 kg) IBW/kg (Calculated) : 47.23 Adjusted Body Weight:   Vital Signs: Temp: 98.4 F (36.9 C) (10/06 1755) BP: 175/77 (10/06 2100) Pulse Rate: 65 (10/06 2100) Intake/Output from previous day: No intake/output data recorded. Intake/Output from this shift: No intake/output data recorded.  Labs: Recent Labs    07/19/19 1755  WBC 6.0  HGB 13.5  HCT 38.7  PLT 171  CREATININE 0.37*  ALBUMIN 3.8  PROT 7.2  AST 173*  ALT 604*  ALKPHOS 383*  BILITOT 10.8*  BILIDIR 6.7*  IBILI 4.1*   Estimated Creatinine Clearance: 51.3 mL/min (A) (by C-G formula based on SCr of 0.37 mg/dL (L)).   Microbiology: No results found for this or any previous visit (from the past 720 hour(s)).  Medical History: Past Medical History:  Diagnosis Date  . Arthritis   . Colon polyp 1994  . Complication of anesthesia    nausea  . Cystitis   . Diabetes mellitus without complication ([place])   . Diffuse cystic mastopathy   . Hypertension   . PONV (postoperative nausea and vomiting)    in distant past  . Pre-diabetes     Medications:  (Not in a hospital admission)   Assessment: Pt presented to ED with jaundice and loss of appetite for a few days.  Pt was taking APAP 1000 mg PO BID since 9/4 but recently decreased dose to APAP 500 mg PO BID.   Liver enzymes are elevated ,  INR is WNL , serum APAP not resulted yet.  10/6 @ 9935:   AST = 173                          ALT = 604                          INR = 0.9   Goal of Therapy:  Normalization of liver enzymes.  APAP level WNL   Plan:  Contacted poison control who advised to begin N-AC .   Will order N-AC 8850 units IV X 1 via bolus (150 mg/kg) followed by N-AC infusion to run for 24 hrs @ 15mg /kg/hr.  Poison control advised [place] to repeat all labs  within ~ 22 hrs of beginning of drip.   Jatavis Malek D 07/19/2019,9:22 PM

## 2019-07-19 NOTE — ED Notes (Signed)
Awaiting acetylcysteine from pharm

## 2019-07-19 NOTE — ED Provider Notes (Signed)
[facility] Emergency Department Provider Note   ____________________________________________   First MD Initiated Contact with Patient 07/19/19 1934     (approximate)  I have reviewed the triage vital signs and the nursing notes.   HISTORY  Chief Complaint Dizziness    HPI Chelsey [name] is a 73 y.o. female who comes in because she has been feeling weak and tired and maybe a little dizzy.  She reports she is noticed jaundice.  She denies any pain.  She says she has been taking 2 extra strength Tylenol twice a day since September 4.  Several days ago she was feeling better from the pain from her surgery on so she went down to 1 pill twice a day.  She denies any alcohol.  She has been eating well up until a few days ago and the jaundice began and she began losing her appetite.         Past Medical History:  Diagnosis Date  . Arthritis   . Colon polyp 1994  . Complication of anesthesia    nausea  . Cystitis   . Diabetes mellitus without complication ([facility])   . Diffuse cystic mastopathy   . Hypertension   . PONV (postoperative nausea and vomiting)    in distant past  . Pre-diabetes     Patient Active Problem List   Diagnosis Date Noted  . Constipation, chronic 07/28/2018  . Hx of adenomatous colonic polyps 07/28/2018  . Hyperglycemia 06/29/2017  . Primary osteoarthritis of first carpometacarpal joint of right hand 12/02/2016  . Extensor tenosynovitis of right wrist 07/03/2016  . Pain involving joint of finger of left hand 03/27/2016  . Cracked skin 12/04/2015  . 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 Tinea pedis of both feet 08/24/2015  . Hypertension 05/22/2014  . Osteopenia 05/22/2014  . Fibrocystic breast disease 05/20/2013    Past Surgical History:  Procedure Laterality Date  . BREAST BIOPSY Bilateral    neg  . BREAST CYST ASPIRATION Right    neg  . BREAST LUMPECTOMY Right 1976  . BREAST SURGERY Left 2002   excision  . COLONOSCOPY   2011, 09/2013  . COLONOSCOPY WITH PROPOFOL N/A 10/29/2018   Procedure: COLONOSCOPY WITH PROPOFOL;  Surgeon: Scot Jun, MD;  Location: [facility] ENDOSCOPY;  Service: Endoscopy;  Laterality: N/A;  . FINGER ARTHROPLASTY Right 03/24/2018   Procedure: FINGER ARTHROPLASTY (CMC TENDON INTERPOSITIONAL ARTHROPLASTY);  Surgeon: Erin Sons, MD;  Location: [facility] ORS;  Service: Orthopedics;  Laterality: Right;  . HAND SURGERY Left July 2013   thumb  . INCISION AND DRAINAGE HIP Right 06/17/2019   Procedure: right open gluteus medius repair, it band release, trochanteric bursectomy;  Surgeon: Signa Kell, MD;  Location: [facility] ORS;  Service: Orthopedics;  Laterality: Right;  . laparotomy     exploratory r/t liver  . LASER ABLATION Bilateral Dec 2015   Dr Wyn Quaker  . POLYPECTOMY  1994  . TUBAL LIGATION  1976    Prior to Admission medications   Medication Sig Start Date End Date Taking? Authorizing Provider  acetaminophen (TYLENOL) 500 MG tablet Take 2 tablets (1,000 mg total) by mouth every 8 (eight) hours. 06/17/19 06/16/20  Signa Kell, MD  Calcium Carbonate-Vit D-Min (CALTRATE 600+D PLUS PO) Take 1 tablet by mouth 2 (two) times daily.     [provider]  cholecalciferol (VITAMIN D) 1000 units tablet Take 1,000 Units by mouth 2 (two) times daily.    [provider]  docusate sodium (COLACE) 100 MG capsule  Take 200 mg by mouth at bedtime.    [provider]  hydrochlorothiazide (HYDRODIURIL) 25 MG tablet Take 12.5 mg by mouth daily.    [provider]  Multiple Vitamins-Minerals (CENTRUM SILVER PO) Take 1 tablet by mouth daily.    [provider]  Omega-3 Fatty Acids (FISH OIL) 1200 MG CAPS Take 1,200 mg by mouth 2 (two) times daily.    [provider]  ondansetron (ZOFRAN ODT) 4 MG disintegrating tablet Take 1 tablet (4 mg total) by mouth every 8 (eight) hours as needed for nausea or vomiting. 06/17/19   Leim Fabry, MD  oxyCODONE (ROXICODONE) 5 MG  immediate release tablet Take 1-2 tablets (5-10 mg total) by mouth every 4 (four) hours as needed (pain). 06/17/19 06/16/20  Leim Fabry, MD  Propylene Glycol (SYSTANE COMPLETE OP) Place 1 drop into both eyes 2 (two) times daily.    [provider]  vitamin E 400 UNIT capsule Take 400 Units by mouth 2 (two) times daily.     [provider]    Allergies Patient has no known allergies.  Family History  Problem Relation Age of Onset  . Cancer Father        lung  . Cancer Mother        colon  . Cancer Sister        lung  . Pulmonary fibrosis Brother   . Pulmonary fibrosis Sister   . Breast cancer Neg Hx     Social History Social History   Tobacco Use  . Smoking status: Never Smoker  . Smokeless tobacco: Never Used  Substance Use Topics  . Alcohol use: No  . Drug use: Never    Review of Systems  Constitutional: No fever/chills Eyes: No visual changes. ENT: No sore throat. Cardiovascular: Denies chest pain. Respiratory: Denies shortness of breath. Gastrointestinal: No abdominal pain.   nausea, no vomiting.  No diarrhea.  No constipation. Genitourinary: Negative for dysuria. Musculoskeletal: Negative for back pain. Skin: Negative for rash. Neurological: Negative for headaches, focal weakness   ____________________________________________   PHYSICAL EXAM:  VITAL SIGNS: ED Triage Vitals  Enc Vitals Group     BP 07/19/19 1755 (!) 205/78     Pulse Rate 07/19/19 1755 74     Resp 07/19/19 1755 18     Temp 07/19/19 1755 98.4 F (36.9 C)     Temp src --      SpO2 07/19/19 1755 95 %     Weight 07/19/19 1750 130 lb (59 kg)     Height 07/19/19 1750 5' 0.75" (1.543 m)     Head Circumference --      Peak Flow --      Pain Score 07/19/19 1750 0     Pain Loc --      Pain Edu? --      Excl. in [place]? --    Constitutional: Alert and oriented. Well appearing and in no acute distress. Eyes: Conjunctivae are jaundiced Head: Atraumatic. Nose: No  congestion/rhinnorhea. Mouth/Throat: Mucous membranes are moist.  Oropharynx non-erythematous. Neck: No stridor. Cardiovascular: Normal rate, regular rhythm. Grossly normal heart sounds.  Good peripheral circulation. Respiratory: Normal respiratory effort.  No retractions. Lungs CTAB. Gastrointestinal: Soft and nontender. No distention. No abdominal bruits. No CVA tenderness. Musculoskeletal: No lower extremity tenderness nor edema.   Neurologic:  Normal speech and language. No gross focal neurologic deficits are appreciated.  Skin:  Skin is warm, dry and intact. No rash noted.  Patient is jaundiced  ____________________________________________   LABS (all labs ordered are listed, but only abnormal results are displayed)  Labs Reviewed  BASIC METABOLIC PANEL - Abnormal; Notable for the following components:      Result Value   Sodium 130 (*)    Potassium 3.0 (*)    Chloride 87 (*)    Glucose, Bld 111 (*)    Creatinine, Ser 0.37 (*)    Calcium 10.4 (*)    All other components within normal limits  HEPATIC FUNCTION PANEL - Abnormal; Notable for the following components:   AST 173 (*)    ALT 604 (*)    Alkaline Phosphatase 383 (*)    Total Bilirubin 10.8 (*)    Bilirubin, Direct 6.7 (*)    Indirect Bilirubin 4.1 (*)    All other components within normal limits  CBC  LACTIC ACID, PLASMA  PROTIME-INR  URINALYSIS, COMPLETE (UACMP) WITH MICROSCOPIC  HEPATITIS PANEL, ACUTE  LACTIC ACID, PLASMA  ACETAMINOPHEN LEVEL  CBG MONITORING, ED   ____________________________________________  EKG   ____________________________________________  RADIOLOGY  ED MD interpretation:    Official radiology report(s): No results found.  ____________________________________________   PROCEDURES  Procedure(s) performed (including Critical Care): Critical care time half an hour this includes talking with the patient and her husband reviewing the old records in some detail talking to  poison control and the hospitalist.  Procedures   ____________________________________________   INITIAL IMPRESSION / ASSESSMENT AND PLAN / ED COURSE  Chelsey [name] was evaluated in Emergency Department on 07/19/2019 for the symptoms described in the history of present illness. She was evaluated in the context of the global COVID-19 pandemic, which necessitated consideration that the patient might be at risk for infection with the SARS-CoV-2 virus that causes COVID-19. Institutional protocols and algorithms that pertain to the evaluation of patients at risk for COVID-19 are in a state of rapid change based on information released by regulatory bodies including the CDC and federal and state organizations. These policies and algorithms were followed during the patient's care in the ED.    Patient's ultrasound does not appear to have any reason for her elevated liver functions.  This may be chronic Tylenol overdose.  Poison control is worried about this.  They recommend admitting and putting her on Acetadote.  I will talk to the hospital doc.          ____________________________________________   FINAL CLINICAL IMPRESSION(S) / ED DIAGNOSES  Final diagnoses:  Jaundice  Elevated liver function tests     ED Discharge Orders    None       Note:  This document was prepared using Dragon voice recognition software and may include unintentional dictation errors.    Arnaldo NatalMalinda, Natalie Leclaire F, MD 07/19/19 2103

## 2019-07-19 NOTE — ED Triage Notes (Signed)
Pt to the er from [facility] for dizziness, fatigue and possible jaundice and liver damage. Pt states this has been going on for 4 days.

## 2019-07-20 ENCOUNTER — Inpatient Hospital Stay: Payer: Medicare Other

## 2019-07-20 LAB — COMPREHENSIVE METABOLIC PANEL
ALT: 537 U/L — ABNORMAL HIGH (ref 0–44)
ALT: 412 U/L — ABNORMAL HIGH (ref 0–44)
AST: 136 U/L — ABNORMAL HIGH (ref 15–41)
AST: 116 U/L — ABNORMAL HIGH (ref 15–41)
Albumin: 3.3 g/dL — ABNORMAL LOW (ref 3.5–5.0)
Albumin: 2.9 g/dL — ABNORMAL LOW (ref 3.5–5.0)
Alkaline Phosphatase: 344 U/L — ABNORMAL HIGH (ref 38–126)
Alkaline Phosphatase: 295 U/L — ABNORMAL HIGH (ref 38–126)
Anion gap: 12 (ref 5–15)
Anion gap: 10 (ref 5–15)
BUN: 11 mg/dL (ref 8–23)
BUN: 11 mg/dL (ref 8–23)
CO2: 28 mmol/L (ref 22–32)
CO2: 25 mmol/L (ref 22–32)
Calcium: 9.8 mg/dL (ref 8.9–10.3)
Calcium: 9.1 mg/dL (ref 8.9–10.3)
Chloride: 89 mmol/L — ABNORMAL LOW (ref 98–111)
Chloride: 99 mmol/L (ref 98–111)
Creatinine, Ser: 0.32 mg/dL — ABNORMAL LOW (ref 0.44–1.00)
Creatinine, Ser: 0.37 mg/dL — ABNORMAL LOW (ref 0.44–1.00)
GFR calc Af Amer: >60 mL/min (ref >60)
GFR calc Af Amer: >60 mL/min (ref >60)
GFR calc non Af Amer: >60 mL/min (ref >60)
GFR calc non Af Amer: >60 mL/min (ref >60)
Glucose, Bld: 129 mg/dL — ABNORMAL HIGH (ref 70–99)
Glucose, Bld: 121 mg/dL — ABNORMAL HIGH (ref 70–99)
Potassium: 2.8 mmol/L — ABNORMAL LOW (ref 3.5–5.1)
Potassium: 4.1 mmol/L (ref 3.5–5.1)
Sodium: 129 mmol/L — ABNORMAL LOW (ref 135–145)
Sodium: 134 mmol/L — ABNORMAL LOW (ref 135–145)
Total Bilirubin: 11.7 mg/dL — ABNORMAL HIGH (ref 0.3–1.2)
Total Bilirubin: 10 mg/dL — ABNORMAL HIGH (ref 0.3–1.2)
Total Protein: 6.5 g/dL (ref 6.5–8.1)
Total Protein: 5.5 g/dL — ABNORMAL LOW (ref 6.5–8.1)

## 2019-07-20 LAB — CBC
HCT: 38 % (ref 36.0–46.0)
HCT: 33.5 % — ABNORMAL LOW (ref 36.0–46.0)
Hemoglobin: 13.8 g/dL (ref 12.0–15.0)
Hemoglobin: 12 g/dL (ref 12.0–15.0)
MCH: 32.5 pg (ref 26.0–34.0)
MCH: 32.8 pg (ref 26.0–34.0)
MCHC: 36.3 g/dL — ABNORMAL HIGH (ref 30.0–36.0)
MCHC: 35.8 g/dL (ref 30.0–36.0)
MCV: 89.4 fL (ref 80.0–100.0)
MCV: 91.5 fL (ref 80.0–100.0)
Platelets: 184 10*3/uL (ref 150–400)
Platelets: 177 10*3/uL (ref 150–400)
RBC: 4.25 MIL/uL (ref 3.87–5.11)
RBC: 3.66 MIL/uL — ABNORMAL LOW (ref 3.87–5.11)
RDW: 13.2 % (ref 11.5–15.5)
RDW: 13.7 % (ref 11.5–15.5)
WBC: 6.8 10*3/uL (ref 4.0–10.5)
WBC: 5.7 10*3/uL (ref 4.0–10.5)
nRBC: 0 % (ref 0.0–0.2)
nRBC: 0 % (ref 0.0–0.2)

## 2019-07-20 LAB — HEPATITIS PANEL, ACUTE
HCV Ab: NONREACTIVE
HCV Ab: NONREACTIVE
Hep A IgM: NONREACTIVE
Hep A IgM: NONREACTIVE
Hep B C IgM: NONREACTIVE
Hep B C IgM: NONREACTIVE
Hepatitis B Surface Ag: NONREACTIVE
Hepatitis B Surface Ag: NONREACTIVE

## 2019-07-20 LAB — MAGNESIUM: Magnesium: 1.8 mg/dL (ref 1.7–2.4)

## 2019-07-20 LAB — SARS CORONAVIRUS 2 (TAT 6-24 HRS): SARS Coronavirus 2: NEGATIVE

## 2019-07-20 LAB — PROTIME-INR
INR: 1 (ref 0.8–1.2)
Prothrombin Time: 13.2 seconds (ref 11.4–15.2)

## 2019-07-20 LAB — FERRITIN: Ferritin: 114 ng/mL (ref 11–307)

## 2019-07-20 LAB — ACETAMINOPHEN LEVEL: Acetaminophen (Tylenol), Serum: <10 ug/mL — ABNORMAL LOW (ref 10–30)

## 2019-07-20 LAB — CK: Total CK: 66 U/L (ref 38–234)

## 2019-07-20 MED ORDER — IOHEXOL 300 MG/ML  SOLN
100.0000 mL | Freq: Once | INTRAMUSCULAR | Status: AC | PRN
  Administered 2019-07-20: 02:00:00 100 mL via INTRAVENOUS

## 2019-07-20 MED ORDER — GADOBUTROL 1 MMOL/ML IV SOLN
6.0000 mL | Freq: Once | INTRAVENOUS | Status: AC | PRN
  Administered 2019-07-20: 6 mL via INTRAVENOUS

## 2019-07-20 MED ORDER — POTASSIUM CHLORIDE 10 MEQ/100ML IV SOLN
10.0000 meq | INTRAVENOUS | Status: AC
  Administered 2019-07-20 (×2): 10 meq via INTRAVENOUS
  Filled 2019-07-20 (×2): qty 100

## 2019-07-20 MED ORDER — SODIUM CHLORIDE 0.9 % IV SOLN
INTRAVENOUS | Status: DC
  Administered 2019-07-20 – 2019-07-21 (×2): via INTRAVENOUS

## 2019-07-20 MED ORDER — POTASSIUM CHLORIDE CRYS ER 20 MEQ PO TBCR
40.0000 meq | EXTENDED_RELEASE_TABLET | ORAL | Status: AC
  Administered 2019-07-20 (×2): 40 meq via ORAL
  Filled 2019-07-20 (×2): qty 2

## 2019-07-20 MED ORDER — POTASSIUM CHLORIDE 10 MEQ/100ML IV SOLN
10.0000 meq | INTRAVENOUS | Status: AC
  Administered 2019-07-20 (×2): 10 meq via INTRAVENOUS

## 2019-07-20 NOTE — Progress Notes (Signed)
[place] [facility] [place] NAME: Chelsey [name]    MR#:  [id number]  DATE OF BIRTH:  08-17-1946  SUBJECTIVE:  CHIEF COMPLAINT:   Chief Complaint  Patient presents with   Dizziness   -Patient admitted for dizziness, fatigue and jaundice.  Labs are abnormal.  She denies any chest pain, abdominal pain or other complaints -Awaiting GI input  REVIEW OF SYSTEMS:  Review of Systems  Constitutional: Negative for chills and fever.  HENT: Negative for congestion, ear discharge, hearing loss and nosebleeds.   Eyes: Negative for blurred vision and double vision.       Jaundice   Respiratory: Negative for cough, shortness of breath and wheezing.   Cardiovascular: Negative for chest pain and palpitations.  Gastrointestinal: Negative for abdominal pain, constipation, diarrhea, nausea and vomiting.  Genitourinary: Negative for dysuria.  Musculoskeletal: Negative for myalgias.  Neurological: Negative for dizziness, speech change, focal weakness, seizures and headaches.  Psychiatric/Behavioral: Negative for depression.    DRUG ALLERGIES:  No Known Allergies  VITALS:  Blood pressure (!) 165/77, pulse 70, temperature 97.7 F (36.5 C), temperature source Oral, resp. rate 17, height 5' 0.75" (1.543 m), weight 59 kg, SpO2 99 %.  PHYSICAL EXAMINATION:  Physical Exam  GENERAL:  73 y.o.-year-old patient lying in the bed with no acute distress.  EYES: Pupils equal, round, reactive to light and accommodation. No scleral icterus. Extraocular muscles intact.  HEENT: Head atraumatic, normocephalic. Oropharynx and nasopharynx clear.  NECK:  Supple, no jugular venous distention. No thyroid enlargement, no tenderness.  LUNGS: Normal breath sounds bilaterally, no wheezing, rales,rhonchi or crepitation. No use of accessory muscles of respiration.  CARDIOVASCULAR: S1, S2 normal. No murmurs, rubs, or gallops.  ABDOMEN: Soft, nontender, nondistended. Bowel sounds  present. No organomegaly or mass.  EXTREMITIES: No pedal edema, cyanosis, or clubbing.  NEUROLOGIC: Cranial nerves II through XII are intact. Muscle strength 5/5 in all extremities. Sensation intact. Gait not checked.  PSYCHIATRIC: The patient is alert and oriented x 3.  SKIN: No obvious rash, lesion, or ulcer.    LABORATORY PANEL:   CBC Recent Labs  Lab 07/20/19 0411  WBC 6.8  HGB 13.8  HCT 38.0  PLT 184   ------------------------------------------------------------------------------------------------------------------  Chemistries  Recent Labs  Lab 07/20/19 0411  NA 129*  K 2.8*  CL 89*  CO2 28  GLUCOSE 129*  BUN 11  CREATININE 0.32*  CALCIUM 9.8  AST 136*  ALT 537*  ALKPHOS 344*  BILITOT 11.7*   ------------------------------------------------------------------------------------------------------------------  Cardiac Enzymes No results for input(s): TROPONINI in the last 168 hours. ------------------------------------------------------------------------------------------------------------------  RADIOLOGY:  Ct Abdomen Pelvis W Contrast  Result Date: 07/20/2019 CLINICAL DATA:  Transaminitis with painless jaundice EXAM: CT ABDOMEN AND PELVIS WITH CONTRAST TECHNIQUE: Multidetector CT imaging of the abdomen and pelvis was performed using the standard protocol following bolus administration of intravenous contrast. CONTRAST:  130m OMNIPAQUE IOHEXOL 300 MG/ML  SOLN COMPARISON:  Ultrasound 07/19/2019 FINDINGS: Lower chest: Lung bases demonstrate no acute consolidation or effusion. Borderline cardiomegaly. Mild distal esophageal thickening. Hepatobiliary: Lobulated liver contour. No focal hepatic abnormality. Status post cholecystectomy. No biliary dilatation. Pancreas: Unremarkable. No pancreatic ductal dilatation or surrounding inflammatory changes. Spleen: Normal in size without focal abnormality. Adrenals/Urinary Tract: Adrenal glands are unremarkable. Kidneys are  normal, without renal calculi, focal lesion, or hydronephrosis. Bladder is unremarkable. Stomach/Bowel: Stomach is within normal limits. Appendix appears normal. No evidence of bowel wall thickening, distention, or inflammatory changes. Vascular/Lymphatic: Mild aortic atherosclerosis. No aneurysm.  No significantly enlarged lymph nodes. Reproductive: Uterus and bilateral adnexa are unremarkable. Other: Negative for free air or free fluid. Musculoskeletal: No acute or suspicious osseous abnormality. Degenerative changes. Linear density within the subcutaneous fat of the right hip extending towards the skin surface, possible area of scarring. IMPRESSION: 1. Lobulated liver contour suggesting cirrhosis. No focal hepatic abnormality. Negative for biliary dilatation. 2. No CT evidence for acute intra-abdominal or pelvic abnormality Electronically Signed   By: Donavan Foil M.D.   On: 07/20/2019 02:28   US Abdomen Limited Ruq  Result Date: 07/19/2019 CLINICAL DATA:  Jaundice, elevated liver function test. EXAM: ULTRASOUND ABDOMEN LIMITED RIGHT UPPER QUADRANT COMPARISON:  None. FINDINGS: Gallbladder: The gallbladder is partially contracted. No sonographic Percell [name] sign is reported by the ultrasound technologist. No gallstone is noted. Common bile duct: Diameter: 4 mm Liver: No focal liver lesion is identified. There is nodular border of the liver. Mild increased echotexture of the liver is noted. Portal vein is patent on color Doppler imaging with normal direction of blood flow towards the liver. Other: None. IMPRESSION: Nodular border of liver with mild increased echotexture of liver. The findings are nonspecific. This can be seen in cirrhosis of liver. Electronically Signed   By: Abelardo Diesel M.D.   On: 07/19/2019 21:03    EKG:   Orders placed or performed during the hospital encounter of 07/19/19   ED EKG   ED EKG   EKG 12-Lead   EKG 12-Lead    ASSESSMENT AND PLAN:   73 year old female with past  medical history significant for diabetes, hypertension, recent right gluteus muscle surgery presents to hospital secondary to jaundice.  1.  Abnormal liver function tests-elevated transaminases and alk phos and jaundice -Viral hepatitis panel is negative.  Concern for intrahepatic versus extrahepatic cholestasis -Recent muscle surgery could explain transaminases elevation but since bilirubin is elevated concern for intrahepatic causes -CT and ultrasound showing cirrhotic changes. -After her surgery about a month ago patient took 2 g of Tylenol per day for 2 weeks followed by 1 g of Tylenol per day. -Receiving N-acetylcysteine -Tylenol level is undetectable.  Continue to monitor LFTs -Antimitochondrial antibody, anti-smooth muscle antibody, aldolase, CPK, EBV, CMV PCR's ordered -Viral hepatitis panel is negative.  If needed, will get an MRCP -GI consult is pending.  Continue to monitor  2.  Hypokalemia and hyponatremia-receiving IV fluids.  Being replaced  3.  Right gluteus medius surgery-has tear of right gluteus medius tendon.  Status post surgery and hip brace about a month ago.  4.  DVT prophylaxis-Lovenox   All the records are reviewed and case discussed with Care Management/Social Workerr. Management plans discussed with the patient, family and they are in agreement.  CODE STATUS: Full code  TOTAL TIME TAKING CARE OF THIS PATIENT: 37 minutes.   POSSIBLE D/C IN 1-2 DAYS, DEPENDING ON CLINICAL CONDITION.   Gladstone Lighter M.D on 07/20/2019 at 12:04 PM  Between 7am to 6pm - Pager - [phone]  After 6pm go to [email or web address] - password [email or web address] [facility]  Office  [phone]  CC: Primary care physician; Juluis Pitch, MD

## 2019-07-20 NOTE — Consult Note (Signed)
GI Inpatient Consult Note  Reason for Consult: Painless Jaundice, Elevated LFTs   Attending Requesting Consult: Dr. Tressia Miners, MD  History of Present Illness: Chelsey [name] is a 73 y.o. female seen for evaluation of painless jaundice and elevated LFTs at the request of Dr. Tressia Miners, MD. Patient presented to the ED last night for generalized weakness, fatigue, and jaundice. She was found to have elevated LFTs with AST 173, ALT 604, alk phos 383, and total bilirubin 10.8 (direct component 6.7). RUQ US showed nodular border of liver with mild increased echotexture of the liver with no extrahepatic or biliary dilatation. CT abd/pelvis showed lobulated liver contour suggesting cirrhosis with no focal hepatic abnormality and no biliary ductal dilatation, no acute abdominal or pelvic pathology. She reports no known family hx of liver disease or hepatocellular carcinoma. She denies any recent medication changes. She does endorse frequent Tylenol use - up to 2000 mg/daily. She reports she recently had right gluteus medius surgery appx one month ago for tear of right gluteus medius tendon. She had negative viral hepatitis panel. She denies any fever, nausea, vomiting, or abdominal pain. There are no mental status changes. She does endorse a 38-lb weight loss over the past 6 months, which she sees some of it has been intentional by decreasing her caloric intake. She does report she has had orange colored urine and paler colored stools since Friday. She is concerned because she has never had symptoms like this in the past. She denies any hematochezia or melena. Patient's husband is in the room and helps with history. She denies any new skin rashes or skin lesions.   Last Colonoscopy: 10/2018 -[facility] -Diverticulosis -Internal hemorrhoids -no polyps   Last Endoscopy:    Past Medical History:  Past Medical History:  Diagnosis Date  . Arthritis   . Colon polyp 1994  . Complication of anesthesia    nausea  . Cystitis   . Diabetes mellitus without complication ([place])   . Diffuse cystic mastopathy   . Hypertension   . PONV (postoperative nausea and vomiting)    in distant past  . Pre-diabetes     Problem List: Patient Active Problem List   Diagnosis Date Noted  . Uncontrolled hypertension 07/19/2019  . Diabetes ([place]) 07/19/2019  . Transaminitis 07/19/2019  . Hyperbilirubinemia 07/19/2019  . Constipation, chronic 07/28/2018  . Hx of adenomatous colonic polyps 07/28/2018  . Hyperglycemia 06/29/2017  . Primary osteoarthritis of first carpometacarpal joint of right hand 12/02/2016  . Extensor tenosynovitis of right wrist 07/03/2016  . Pain involving joint of finger of left hand 03/27/2016  . Cracked skin 12/04/2015  . 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 Tinea pedis of both feet 08/24/2015  . Hypertension 05/22/2014  . Osteopenia 05/22/2014  . Fibrocystic breast disease 05/20/2013    Past Surgical History: Past Surgical History:  Procedure Laterality Date  . BREAST BIOPSY Bilateral    neg  . BREAST CYST ASPIRATION Right    neg  . BREAST LUMPECTOMY Right 1976  . BREAST SURGERY Left 2002   excision  . COLONOSCOPY  2011, 09/2013  . COLONOSCOPY WITH PROPOFOL N/A 10/29/2018   Procedure: COLONOSCOPY WITH PROPOFOL;  Surgeon: Manya Silvas, MD;  Location: [facility] ENDOSCOPY;  Service: Endoscopy;  Laterality: N/A;  . FINGER ARTHROPLASTY Right 03/24/2018   Procedure: FINGER ARTHROPLASTY (CMC TENDON INTERPOSITIONAL ARTHROPLASTY);  Surgeon: Leanor Kail, MD;  Location: [facility] ORS;  Service: Orthopedics;  Laterality: Right;  . HAND SURGERY Left July 2013   thumb  . INCISION AND  DRAINAGE HIP Right 06/17/2019   Procedure: right open gluteus medius repair, it band release, trochanteric bursectomy;  Surgeon: Leim Fabry, MD;  Location: [facility] ORS;  Service: Orthopedics;  Laterality: Right;  . laparotomy     exploratory r/t liver  . LASER ABLATION Bilateral Dec 2015   Dr Lucky Cowboy  . POLYPECTOMY  1994   . TUBAL LIGATION  1976    Allergies: No Known Allergies  Home Medications: Medications Prior to Admission  Medication Sig Dispense Refill Last Dose  . acetaminophen (TYLENOL) 500 MG tablet Take 2 tablets (1,000 mg total) by mouth every 8 (eight) hours. 90 tablet 2 prn at prn  . aspirin EC 81 MG tablet Take 162 mg by mouth at bedtime.   07/18/2019 at Unknown time  . cholecalciferol (VITAMIN D) 1000 units tablet Take 1,000 Units by mouth 2 (two) times daily.   07/19/2019 at Unknown time  . docusate sodium (COLACE) 100 MG capsule Take 200 mg by mouth at bedtime.   07/18/2019 at Unknown time  . hydrochlorothiazide (HYDRODIURIL) 25 MG tablet Take 12.5 mg by mouth daily.   07/19/2019 at Unknown time  . Multiple Vitamins-Minerals (CENTRUM SILVER PO) Take 1 tablet by mouth daily.   07/19/2019 at Unknown time  . Propylene Glycol (SYSTANE COMPLETE OP) Place 1 drop into both eyes 2 (two) times daily.   07/19/2019 at Unknown time  . vitamin E 400 UNIT capsule Take 400 Units by mouth 2 (two) times daily.    07/19/2019 at Unknown time   Home medication reconciliation was completed with the patient.   Scheduled Inpatient Medications:   . enoxaparin (LOVENOX) injection  40 mg Subcutaneous Q24H  . influenza vaccine adjuvanted  0.5 mL Intramuscular Tomorrow-1000  . potassium chloride  40 mEq Oral Q4H  . sodium chloride flush  3 mL Intravenous Once    Continuous Inpatient Infusions:   . sodium chloride 75 mL/hr at 07/20/19 1344  . acetylcysteine 15 mg/kg/hr (07/20/19 0609)    PRN Inpatient Medications:  diphenhydrAMINE, hydrALAZINE, ibuprofen, ondansetron **OR** ondansetron (ZOFRAN) IV  Family History: family history includes Cancer in her father, mother, and sister; Pulmonary fibrosis in her brother and sister.  The patient's family history is negative for inflammatory bowel disorders, GI malignancy, or solid organ transplantation.  Social History:   reports that she has never smoked. She has never  used smokeless tobacco. She reports that she does not drink alcohol or use drugs. The patient denies ETOH, tobacco, or drug use.   Review of Systems: Constitutional: + weight loss Eyes: No changes in vision. ENT: No oral lesions, sore throat.  GI: see HPI.  Heme/Lymph: No easy bruising.  CV: No chest pain.  GU: No hematuria.  Integumentary: No rashes.  Neuro: No headaches.  Psych: No depression/anxiety.  Endocrine: No heat/cold intolerance.  Allergic/Immunologic: No urticaria.  Resp: No cough, SOB.  Musculoskeletal: No joint swelling.    Physical Examination: BP (!) 165/77 (BP Location: Right Arm)   Pulse 70   Temp 97.7 F (36.5 C) (Oral)   Resp 17   Ht 5' 0.75" (1.543 m)   Wt 59 kg   SpO2 99%   BMI 24.77 kg/m  Gen: NAD, alert and oriented x 4 HEENT: PEERLA, EOMI, +scleral icterus Neck: supple, no JVD or thyromegaly Chest: CTA bilaterally, no wheezes, crackles, or other adventitious sounds CV: RRR, no m/g/c/r Abd: soft, NT, ND, +BS in all four quadrants; no HSM, guarding, ridigity, or rebound tenderness Ext: no edema, well perfused with 2+  pulses, Skin: no rash or lesions noted Lymph: no LAD  Data: Lab Results  Component Value Date   WBC 6.8 07/20/2019   HGB 13.8 07/20/2019   HCT 38.0 07/20/2019   MCV 89.4 07/20/2019   PLT 184 07/20/2019   Recent Labs  Lab 07/19/19 1755 07/20/19 0411  HGB 13.5 13.8   Lab Results  Component Value Date   NA 129 (L) 07/20/2019   K 2.8 (L) 07/20/2019   CL 89 (L) 07/20/2019   CO2 28 07/20/2019   BUN 11 07/20/2019   CREATININE 0.32 (L) 07/20/2019   Lab Results  Component Value Date   ALT 537 (H) 07/20/2019   AST 136 (H) 07/20/2019   ALKPHOS 344 (H) 07/20/2019   BILITOT 11.7 (H) 07/20/2019   Recent Labs  Lab 07/19/19 2020  INR 0.9   Assessment/Plan:  73 y/o Caucasian female with a PMH of personal hx of adenomatous colon polyps, diabetes mellitus, HTN, presents for painless jaundice and elevated LFTs  1. Painless  jaundice 2. Elevated LFTs - mixed pattern 3. Unintentional weight loss - 38 lbs x 6 months 4. 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 5. Electrolyte abnormalities - repleted  -Symptomology of generalized weakness and unintentional weight loss along with hepatic serologies with moderate to severe elevation in her LFTs (AST 136, ALT 537, alk phos 344, and total bili 11.7) is concerning for underlying malignancy, such as cholangiocarcinoma. DDx also includes biliary stricture, infections, PBC, PSC, infiltrative diseases (amyloidosis, lymphoma, sarcoidosis, TB), metastatic carcinoma to the liver, underlying cirrhosis -RUQ [place] and CT scan abd/pelvis with no evidence of biliary dilatation. No fever, abdominal pain. -We will proceed with MRI abd w/wo contrast for further evaluation of her biliary tree -We will check AFP, CEA, CA 19-9 -Agree with ANA, AMA, ASMA, and other serologies ordered -We will continue to follow along    Thank you for the consult. Please call with questions or concerns.  [facility] [phone] (Cell)

## 2019-07-20 NOTE — Progress Notes (Signed)
MEDICATION RELATED CONSULT NOTE - INITIAL   Pharmacy Consult for Acetylcysteine  Indication: APAP overdose   No Known Allergies  Patient Measurements: Height: 5' 0.75" (154.3 cm) Weight: 130 lb (59 kg) IBW/kg (Calculated) : 47.23 Adjusted Body Weight:   Vital Signs:   Intake/Output from previous day: 10/06 0701 - 10/07 0700 In: 1480.5 [P.O.:1200; I.V.:280.5] Out: -  Intake/Output from this shift: No intake/output data recorded.  Labs: Recent Labs    07/19/19 1755 07/20/19 0411 07/20/19 1223 07/20/19 1950  WBC 6.0 6.8  --  5.7  HGB 13.5 13.8  --  12.0  HCT 38.7 38.0  --  33.5*  PLT 171 184  --  177  CREATININE 0.37* 0.32*  --  0.37*  MG  --   --  1.8  --   ALBUMIN 3.8 3.3*  --  2.9*  PROT 7.2 6.5  --  5.5*  AST 173* 136*  --  116*  ALT 604* 537*  --  412*  ALKPHOS 383* 344*  --  295*  BILITOT 10.8* 11.7*  --  10.0*  BILIDIR 6.7*  --   --   --   IBILI 4.1*  --   --   --    Estimated Creatinine Clearance: 51.3 mL/min (A) (by C-G formula based on SCr of 0.37 mg/dL (L)).   Microbiology: Recent Results (from the past 720 hour(s))  SARS CORONAVIRUS 2 (TAT 6-24 HRS) Nasopharyngeal Nasopharyngeal Swab     Status: None   Collection Time: 07/19/19  9:10 PM   Specimen: Nasopharyngeal Swab  Result Value Ref Range Status   SARS Coronavirus 2 NEGATIVE NEGATIVE Final    Comment: (NOTE) SARS-CoV-2 target nucleic acids are NOT DETECTED. The SARS-CoV-2 RNA is generally detectable in upper and lower respiratory specimens during the acute phase of infection. Negative results do not preclude SARS-CoV-2 infection, do not rule out co-infections with other pathogens, and should not be used as the sole basis for treatment or other patient management decisions. Negative results must be combined with clinical observations, patient history, and epidemiological information. The expected result is Negative. Fact Sheet for Patients: HairSlick.no Fact  Sheet for Healthcare Providers: quierodirigir.com This test is not yet approved or cleared by the [place] FDA and  has been authorized for detection and/or diagnosis of SARS-CoV-2 by FDA under an Emergency Use Authorization (EUA). This EUA will remain  in effect (meaning this test can be used) for the duration of the COVID-19 declaration under Section 56 4(b)(1) of the Act, 21 U.S.C. section 360bbb-3(b)(1), unless the authorization is terminated or revoked sooner. Performed at [facility], [address] [place], [place] [address]     Medical History: Past Medical History:  Diagnosis Date  . Arthritis   . Colon polyp 1994  . Complication of anesthesia    nausea  . Cystitis   . Diabetes mellitus without complication ([facility])   . Diffuse cystic mastopathy   . Hypertension   . PONV (postoperative nausea and vomiting)    in distant past  . Pre-diabetes     Medications:  Medications Prior to Admission  Medication Sig Dispense Refill Last Dose  . acetaminophen (TYLENOL) 500 MG tablet Take 2 tablets (1,000 mg total) by mouth every 8 (eight) hours. 90 tablet 2 prn at prn  . aspirin EC 81 MG tablet Take 162 mg by mouth at bedtime.   07/18/2019 at Unknown time  . cholecalciferol (VITAMIN D) 1000 units tablet Take 1,000 Units by mouth 2 (  two) times daily.   07/19/2019 at Unknown time  . docusate sodium (COLACE) 100 MG capsule Take 200 mg by mouth at bedtime.   07/18/2019 at Unknown time  . hydrochlorothiazide (HYDRODIURIL) 25 MG tablet Take 12.5 mg by mouth daily.   07/19/2019 at Unknown time  . Multiple Vitamins-Minerals (CENTRUM SILVER PO) Take 1 tablet by mouth daily.   07/19/2019 at Unknown time  . Propylene Glycol (SYSTANE COMPLETE OP) Place 1 drop into both eyes 2 (two) times daily.   07/19/2019 at Unknown time  . vitamin E 400 UNIT capsule Take 400 Units by mouth 2 (two) times daily.    07/19/2019 at Unknown time    Assessment: Pt presented to ED  with jaundice and loss of appetite for a few days.  Pt was taking APAP 1000 mg PO BID since 9/4 but recently decreased dose to APAP 500 mg PO BID.   Liver enzymes are elevated ,  INR is WNL , serum APAP not resulted yet.  10/6 @ 7124:   AST = 173                          ALT = 604                          INR = 0.9  10/7 @ 2022:   AST = 116                          ALT = 412                          ALP = 295                           INR = 1  Goal of Therapy:  Normalization of liver enzymes.  APAP level WNL   Plan:  Contacted poison control who advised to begin N-AC .  Ordered N-AC 8850 units IV X 1 via bolus (150 mg/kg) followed by N-AC infusion to run for 24 hrs @ 15mg /kg/hr.  Poison control advised [place] to repeat all labs within ~ 22 hrs of beginning of drip. LFTs are trending down and APAP level is < 10. Team plans to continue N-AC until tomorrow morning.   Oswald Hillock, PharmD, BCPS 07/20/2019,8:21 PM

## 2019-07-21 LAB — COMPREHENSIVE METABOLIC PANEL
ALT: 381 U/L — ABNORMAL HIGH (ref 0–44)
AST: 113 U/L — ABNORMAL HIGH (ref 15–41)
Albumin: 2.8 g/dL — ABNORMAL LOW (ref 3.5–5.0)
Alkaline Phosphatase: 288 U/L — ABNORMAL HIGH (ref 38–126)
Anion gap: 6 (ref 5–15)
BUN: 10 mg/dL (ref 8–23)
CO2: 28 mmol/L (ref 22–32)
Calcium: 9.4 mg/dL (ref 8.9–10.3)
Chloride: 104 mmol/L (ref 98–111)
Creatinine, Ser: 0.41 mg/dL — ABNORMAL LOW (ref 0.44–1.00)
GFR calc Af Amer: >60 mL/min (ref >60)
GFR calc non Af Amer: >60 mL/min (ref >60)
Glucose, Bld: 125 mg/dL — ABNORMAL HIGH (ref 70–99)
Potassium: 3.9 mmol/L (ref 3.5–5.1)
Sodium: 138 mmol/L (ref 135–145)
Total Bilirubin: 10.9 mg/dL — ABNORMAL HIGH (ref 0.3–1.2)
Total Protein: 5.6 g/dL — ABNORMAL LOW (ref 6.5–8.1)

## 2019-07-21 LAB — CEA: CEA: 1.9 ng/mL (ref 0.0–4.7)

## 2019-07-21 LAB — CA 19-9 (SERIAL): CA 19-9: 61 U/mL — ABNORMAL HIGH (ref 0–35)

## 2019-07-21 LAB — AFP TUMOR MARKER: AFP, Serum, Tumor Marker: 1.4 ng/mL (ref 0.0–8.3)

## 2019-07-21 LAB — MITOCHONDRIAL ANTIBODIES: Mitochondrial M2 Ab, IgG: <20 Units (ref 0.0–20.0)

## 2019-07-21 LAB — ANTI-SMOOTH MUSCLE ANTIBODY, IGG: F-Actin IgG: 2 Units (ref 0–19)

## 2019-07-21 LAB — EPSTEIN-BARR VIRUS VCA, IGM: EBV VCA IgM: <36 U/mL (ref 0.0–35.9)

## 2019-07-21 LAB — CERULOPLASMIN: Ceruloplasmin: 32.5 mg/dL (ref 19.0–39.0)

## 2019-07-21 LAB — ALDOLASE: Aldolase: 13.8 U/L — ABNORMAL HIGH (ref 3.3–10.3)

## 2019-07-21 MED ORDER — ALBUMIN HUMAN 25 % IV SOLN
25.0000 g | Freq: Once | INTRAVENOUS | Status: AC
  Administered 2019-07-21: 25 g via INTRAVENOUS
  Filled 2019-07-21: qty 100

## 2019-07-21 MED ORDER — IBUPROFEN 400 MG PO TABS: 400.0000 mg | ORAL_TABLET | Freq: Four times a day (QID) | ORAL | 0 refills | Status: DC | PRN

## 2019-07-21 NOTE — Progress Notes (Signed)
MEDICATION RELATED CONSULT NOTE - INITIAL   Pharmacy Consult for Acetylcysteine  Indication: APAP overdose   No Known Allergies  Patient Measurements: Height: 5' 0.75" (154.3 cm) Weight: 130 lb (59 kg) IBW/kg (Calculated) : 47.23 Adjusted Body Weight:   Vital Signs: Temp: 98.3 F (36.8 C) (10/08 0526) Temp Source: Oral (10/08 0526) BP: 153/65 (10/08 0526) Pulse Rate: 68 (10/08 0526) Intake/Output from previous day: 10/07 0701 - 10/08 0700 In: 1468.2 [P.O.:240; I.V.:1147.1; IV Piggyback:81] Out: -  Intake/Output from this shift: No intake/output data recorded.  Labs: Recent Labs    07/19/19 1755 07/20/19 0411 07/20/19 1223 07/20/19 1950 07/21/19 0549  WBC 6.0 6.8  --  5.7  --   HGB 13.5 13.8  --  12.0  --   HCT 38.7 38.0  --  33.5*  --   PLT 171 184  --  177  --   CREATININE 0.37* 0.32*  --  0.37* 0.41*  MG  --   --  1.8  --   --   ALBUMIN 3.8 3.3*  --  2.9* 2.8*  PROT 7.2 6.5  --  5.5* 5.6*  AST 173* 136*  --  116* 113*  ALT 604* 537*  --  412* 381*  ALKPHOS 383* 344*  --  295* 288*  BILITOT 10.8* 11.7*  --  10.0* 10.9*  BILIDIR 6.7*  --   --   --   --   IBILI 4.1*  --   --   --   --    Estimated Creatinine Clearance: 51.3 mL/min (A) (by C-G formula based on SCr of 0.41 mg/dL (L)).   Microbiology: Recent Results (from the past 720 hour(s))  SARS CORONAVIRUS 2 (TAT 6-24 HRS) Nasopharyngeal Nasopharyngeal Swab     Status: None   Collection Time: 07/19/19  9:10 PM   Specimen: Nasopharyngeal Swab  Result Value Ref Range Status   SARS Coronavirus 2 NEGATIVE NEGATIVE Final    Comment: (NOTE) SARS-CoV-2 target nucleic acids are NOT DETECTED. The SARS-CoV-2 RNA is generally detectable in upper and lower respiratory specimens during the acute phase of infection. Negative results do not preclude SARS-CoV-2 infection, do not rule out co-infections with other pathogens, and should not be used as the sole basis for treatment or other patient management  decisions. Negative results must be combined with clinical observations, patient history, and epidemiological information. The expected result is Negative. Fact Sheet for Patients: SugarRoll.be Fact Sheet for Healthcare Providers: https://www.woods-mathews.com/ This test is not yet approved or cleared by the [place] FDA and  has been authorized for detection and/or diagnosis of SARS-CoV-2 by FDA under an Emergency Use Authorization (EUA). This EUA will remain  in effect (meaning this test can be used) for the duration of the COVID-19 declaration under Section 56 4(b)(1) of the Act, 21 U.S.C. section 360bbb-3(b)(1), unless the authorization is terminated or revoked sooner. Performed at [facility], [place] [address], [place],  [address]     Medical History: Past Medical History:  Diagnosis Date  . Arthritis   . Colon polyp 1994  . Complication of anesthesia    nausea  . Cystitis   . Diabetes mellitus without complication ([place])   . Diffuse cystic mastopathy   . Hypertension   . PONV (postoperative nausea and vomiting)    in distant past  . Pre-diabetes     Medications:  Medications Prior to Admission  Medication Sig Dispense Refill Last Dose  . acetaminophen (TYLENOL) 500 MG tablet Take  2 tablets (1,000 mg total) by mouth every 8 (eight) hours. 90 tablet 2 prn at prn  . aspirin EC 81 MG tablet Take 162 mg by mouth at bedtime.   07/18/2019 at Unknown time  . cholecalciferol (VITAMIN D) 1000 units tablet Take 1,000 Units by mouth 2 (two) times daily.   07/19/2019 at Unknown time  . docusate sodium (COLACE) 100 MG capsule Take 200 mg by mouth at bedtime.   07/18/2019 at Unknown time  . hydrochlorothiazide (HYDRODIURIL) 25 MG tablet Take 12.5 mg by mouth daily.   07/19/2019 at Unknown time  . Multiple Vitamins-Minerals (CENTRUM SILVER PO) Take 1 tablet by mouth daily.   07/19/2019 at Unknown time  . Propylene Glycol (SYSTANE  COMPLETE OP) Place 1 drop into both eyes 2 (two) times daily.   07/19/2019 at Unknown time  . vitamin E 400 UNIT capsule Take 400 Units by mouth 2 (two) times daily.    07/19/2019 at Unknown time    Assessment: Pt presented to ED with jaundice and loss of appetite for a few days.  Pt was taking APAP 1000 mg PO BID since 9/4 but recently decreased dose to APAP 500 mg PO BID.   Liver enzymes are elevated ,  INR is WNL.   10/6 @ 1755:   AST = 173                          ALT = 604                          INR = 0.9     APAP < 10 ug/ml 10/7 @ 2022:   AST = 116                          ALT = 412                          ALP = 295                           INR = 1    APAP < 10 ug/ml 10/8 @ 0549    AST = 113    ALT = 381  Goal of Therapy:  Normalization of liver enzymes.  APAP level WNL   Plan:  Contacted poison control with RN Rose who advised to D/C N-AC based on current lab values and pt report.  Poison control has closed case.   Pharmacy will sign off at this time.  Albina Billet, PharmD, BCPS Clinical Pharmacist 07/21/2019 10:43 AM

## 2019-07-21 NOTE — Progress Notes (Signed)
Chelsey [name] to be D/C'd Home per MD order.  Discussed prescriptions and follow up appointments with the patient. Prescriptions given to patient, medication list explained in detail. Pt verbalized understanding.  Allergies as of 07/21/2019   No Known Allergies     Medication List    STOP taking these medications   acetaminophen 500 MG tablet Commonly known as: TYLENOL     TAKE these medications   aspirin EC 81 MG tablet Take 162 mg by mouth at bedtime.   CENTRUM SILVER PO Take 1 tablet by mouth daily.   cholecalciferol 1000 units tablet Commonly known as: VITAMIN D Take 1,000 Units by mouth 2 (two) times daily.   Colace 100 MG capsule Generic drug: docusate sodium Take 200 mg by mouth at bedtime.   hydrochlorothiazide 25 MG tablet Commonly known as: HYDRODIURIL Take 12.5 mg by mouth daily.   ibuprofen 400 MG tablet Commonly known as: ADVIL Take 1 tablet (400 mg total) by mouth every 6 (six) hours as needed for fever, headache or moderate pain.   SYSTANE COMPLETE OP Place 1 drop into both eyes 2 (two) times daily.   vitamin E 400 UNIT capsule Take 400 Units by mouth 2 (two) times daily.       Vitals:   07/21/19 0526 07/21/19 1335  BP: (!) 153/65 (!) 165/75  Pulse: 68 81  Resp: 18 20  Temp: 98.3 F (36.8 C) 98.5 F (36.9 C)  SpO2: 95% 97%    Skin clean, dry and intact without evidence of skin break down, no evidence of skin tears noted. IV catheter discontinued intact. Site without signs and symptoms of complications. Dressing and pressure applied. Pt denies pain at this time. No complaints noted.  An After Visit Summary was printed and given to the patient. Patient escorted via [place], and D/C home via private auto.  Fuller Mandril, RN

## 2019-07-21 NOTE — Progress Notes (Signed)
Madalyn from [employer] [phone] called for patient update. Current VS, MRCP and lab results given over phone. Based on the MRCP results the Poison Control Toxicologist recommended stopping the NAC as the test results point more to other liver issue not caused by acetaminophen.   Fuller Mandril, RN

## 2019-07-21 NOTE — Discharge Instructions (Signed)
Please do not take over OTC or herbal supplements  Without having PCP approval or  until liver tests normalize

## 2019-07-21 NOTE — Discharge Summary (Signed)
[facility] NAME: Chelsey [name]    MR#:  [id number]  DATE OF BIRTH:  [birth year]  DATE OF ADMISSION:  07/19/2019   ADMITTING PHYSICIAN: Lance Coon, MD  DATE OF DISCHARGE: 07/21/2019  PRIMARY CARE PHYSICIAN: Juluis Pitch, MD   ADMISSION DIAGNOSIS:   Jaundice [R17] Elevated liver function tests [R79.89] Elevated LFTs [R79.89]  DISCHARGE DIAGNOSIS:   Principal Problem:   Transaminitis Active Problems:   Uncontrolled hypertension   Hyperbilirubinemia   SECONDARY DIAGNOSIS:   Past Medical History:  Diagnosis Date   Arthritis    Colon polyp 9977   Complication of anesthesia    nausea   Cystitis    Diabetes mellitus without complication (HCC)    Diffuse cystic mastopathy    Hypertension    PONV (postoperative nausea and vomiting)    in distant past   Pre-diabetes     HOSPITAL COURSE:   73 year old female with past medical history significant for diabetes, hypertension, recent right gluteus muscle surgery presents to hospital secondary to jaundice.  1.  Abnormal liver function tests-elevated transaminases and alk phos and jaundice -Viral hepatitis panel is negative.  Concern for intrahepatic versus extrahepatic cholestasis -CT abdomen and ultrasound showing cirrhotic changes. - MRI of the abdomen with no masses noted as there was concern to r/o intra abd tumor -After her surgery about a month ago patient took 2 g of Tylenol per day for 2 weeks followed by 1 g of Tylenol per day. -Patient has received N-acetylcysteine -Tylenol level is undetectable.  -Antimitochondrial antibody, anti-smooth muscle antibody, aldolase, CPK, EBV, CMV PCR's ordered -Appreciate GI consult.  Improving LFTs.  Outpatient follow-up recommended -CA-19-9 is elevated slightly.  Might need EUS if does not improve  2.  Hypokalemia and hyponatremia-received IV fluids.    And potassium was replaced  3.  Right gluteus  medius surgery-has tear of right gluteus medius tendon.  Status post surgery and hip brace about a month ago. -Follow-up with Ortho as prior scheduled.  Continue to use a walker  Updated patient and husband at bedside.  Will be discharged home today   DISCHARGE CONDITIONS:   Guarded  CONSULTS OBTAINED:   Treatment Team:  Efrain Sella, MD  DRUG ALLERGIES:   No Known Allergies DISCHARGE MEDICATIONS:   Allergies as of 07/21/2019   No Known Allergies     Medication List    STOP taking these medications   acetaminophen 500 MG tablet Commonly known as: TYLENOL     TAKE these medications   aspirin EC 81 MG tablet Take 162 mg by mouth at bedtime.   CENTRUM SILVER PO Take 1 tablet by mouth daily.   cholecalciferol 1000 units tablet Commonly known as: VITAMIN D Take 1,000 Units by mouth 2 (two) times daily.   Colace 100 MG capsule Generic drug: docusate sodium Take 200 mg by mouth at bedtime.   hydrochlorothiazide 25 MG tablet Commonly known as: HYDRODIURIL Take 12.5 mg by mouth daily.   ibuprofen 400 MG tablet Commonly known as: ADVIL Take 1 tablet (400 mg total) by mouth every 6 (six) hours as needed for fever, headache or moderate pain.   SYSTANE COMPLETE OP Place 1 drop into both eyes 2 (two) times daily.   vitamin E 400 UNIT capsule Take 400 Units by mouth 2 (two) times daily.        DISCHARGE INSTRUCTIONS:   1.  PCP follow-up in 1 to 2 weeks 2.  GI follow-up  in 2 weeks.  Patient will need LFTs checked in 1 week  DIET:   Regular diet  ACTIVITY:   Activity as tolerated  OXYGEN:   Home Oxygen: No.  Oxygen Delivery: room air  DISCHARGE LOCATION:   home   If you experience worsening of your admission symptoms, develop shortness of breath, life threatening emergency, suicidal or homicidal thoughts you must seek medical attention immediately by calling 911 or calling your MD immediately  if symptoms less severe.  You Must read  complete instructions/literature along with all the possible adverse reactions/side effects for all the Medicines you take and that have been prescribed to you. Take any new Medicines after you have completely understood and accpet all the possible adverse reactions/side effects.   Please note  You were cared for by a hospitalist during your hospital stay. If you have any questions about your discharge medications or the care you received while you were in the hospital after you are discharged, you can call the unit and asked to speak with the hospitalist on call if the hospitalist that took care of you is not available. Once you are discharged, your primary care physician will handle any further medical issues. Please note that NO REFILLS for any discharge medications will be authorized once you are discharged, as it is imperative that you return to your primary care physician (or establish a relationship with a primary care physician if you do not have one) for your aftercare needs so that they can reassess your need for medications and monitor your lab values.    On the day of Discharge:  VITAL SIGNS:   Blood pressure (!) 165/75, pulse 81, temperature 98.5 F (36.9 C), temperature source Oral, resp. rate 20, height 5' 0.75" (1.543 m), weight 59 kg, SpO2 97 %.  PHYSICAL EXAMINATION:    GENERAL:  73 y.o.-year-old patient lying in the bed with no acute distress.  EYES: Pupils equal, round, reactive to light and accommodation.  Positive for scleral icterus. Extraocular muscles intact.  HEENT: Head atraumatic, normocephalic. Oropharynx and nasopharynx clear.  NECK:  Supple, no jugular venous distention. No thyroid enlargement, no tenderness.  LUNGS: Normal breath sounds bilaterally, no wheezing, rales,rhonchi or crepitation. No use of accessory muscles of respiration.  CARDIOVASCULAR: S1, S2 normal. No murmurs, rubs, or gallops.  ABDOMEN: Soft, nontender, nondistended. Bowel sounds present. No  organomegaly or mass.  EXTREMITIES: No pedal edema, cyanosis, or clubbing.  Right gluteus muscle and hip stabilizer in place NEUROLOGIC: Cranial nerves II through XII are intact. Muscle strength 5/5 in all extremities. Sensation intact. Gait not checked.  PSYCHIATRIC: The patient is alert and oriented x 3.  SKIN: No obvious rash, lesion, or ulcer.   DATA REVIEW:   CBC Recent Labs  Lab 07/20/19 1950  WBC 5.7  HGB 12.0  HCT 33.5*  PLT 177    Chemistries  Recent Labs  Lab 07/20/19 1223  07/21/19 0549  NA  --    < > 138  K  --    < > 3.9  CL  --    < > 104  CO2  --    < > 28  GLUCOSE  --    < > 125*  BUN  --    < > 10  CREATININE  --    < > 0.41*  CALCIUM  --    < > 9.4  MG 1.8  --   --   AST  --    < >  113*  ALT  --    < > 381*  ALKPHOS  --    < > 288*  BILITOT  --    < > 10.9*   < > = values in this interval not displayed.     Microbiology Results  Results for orders placed or performed during the hospital encounter of 07/19/19  SARS CORONAVIRUS 2 (TAT 6-24 HRS) Nasopharyngeal Nasopharyngeal Swab     Status: None   Collection Time: 07/19/19  9:10 PM   Specimen: Nasopharyngeal Swab  Result Value Ref Range Status   SARS Coronavirus 2 NEGATIVE NEGATIVE Final    Comment: (NOTE) SARS-CoV-2 target nucleic acids are NOT DETECTED. The SARS-CoV-2 RNA is generally detectable in upper and lower respiratory specimens during the acute phase of infection. Negative results do not preclude SARS-CoV-2 infection, do not rule out co-infections with other pathogens, and should not be used as the sole basis for treatment or other patient management decisions. Negative results must be combined with clinical observations, patient history, and epidemiological information. The expected result is Negative. Fact Sheet for Patients: SugarRoll.be Fact Sheet for Healthcare Providers: https://www.woods-mathews.com/ This test is not yet approved or  cleared by the [place] FDA and  has been authorized for detection and/or diagnosis of SARS-CoV-2 by FDA under an Emergency Use Authorization (EUA). This EUA will remain  in effect (meaning this test can be used) for the duration of the COVID-19 declaration under Section 56 4(b)(1) of the Act, 21 U.S.C. section 360bbb-3(b)(1), unless the authorization is terminated or revoked sooner. Performed at [facility], [place] [address], [place], [place] [address]     RADIOLOGY:  Mr 3d Recon At Scanner  Result Date: 07/21/2019 CLINICAL DATA:  Painless jaundice.  Weight loss. EXAM: MRI ABDOMEN WITHOUT AND WITH CONTRAST (INCLUDING MRCP) TECHNIQUE: Multiplanar multisequence MR imaging of the abdomen was performed both before and after the administration of intravenous contrast. Heavily T2-weighted images of the biliary and pancreatic ducts were obtained, and three-dimensional MRCP images were rendered by post processing. CONTRAST:  39m GADAVIST GADOBUTROL 1 MMOL/ML IV SOLN COMPARISON:  CT scan 07/20/2019 and ultrasound 07/19/2019 FINDINGS: Lower chest: The lung bases are grossly clear. No pleural or pericardial effusion. No worrisome pulmonary lesions. Hepatobiliary: Mild contour irregularity of the liver and slightly prominent hepatic fissures could suggest early changes of cirrhosis. No focal hepatic lesions or intrahepatic biliary dilatation. Peripheral wedge-shaped area of early arterial phase contrast enhancement is consistent with a benign vascular shunt. The portal and hepatic veins are patent. The gallbladder is slightly contracted but no gallstones. No common bile duct dilatation. Normal caliber and course of the common bile duct without findings for common bile duct stones, ampullary lesion or pancreatic mass. Pancreas:  No mass, inflammation or ductal dilatation. Spleen:  Normal size.  No focal lesions. Adrenals/Urinary Tract: The adrenal glands and kidneys are unremarkable. No worrisome renal  lesions or hydronephrosis. Stomach/Bowel: The stomach is distended with food and fluid. The duodenum is unremarkable. The visualized small bowel and colon are grossly normal. Vascular/Lymphatic: The aorta and branch vessels are patent. The major venous structures are patent. No mesenteric or retroperitoneal mass or adenopathy. Other:  No ascites or abdominal wall hernia. Musculoskeletal: No significant bony findings.  Moderate scoliosis. IMPRESSION: 1. Mild contour irregularity of the liver and slightly dilated hepatic fissures could suggest early changes of cirrhosis. 2. No hepatic lesions. 3. Normal pancreaticobiliary tree.  No biliary dilatation. 4. No abdominal mass or adenopathy. Electronically Signed  By: Marijo Sanes M.D.   On: 07/21/2019 07:58   Mr Abdomen Mrcp Moise Boring Contast  Result Date: 07/21/2019 CLINICAL DATA:  Painless jaundice.  Weight loss. EXAM: MRI ABDOMEN WITHOUT AND WITH CONTRAST (INCLUDING MRCP) TECHNIQUE: Multiplanar multisequence MR imaging of the abdomen was performed both before and after the administration of intravenous contrast. Heavily T2-weighted images of the biliary and pancreatic ducts were obtained, and three-dimensional MRCP images were rendered by post processing. CONTRAST:  39m GADAVIST GADOBUTROL 1 MMOL/ML IV SOLN COMPARISON:  CT scan 07/20/2019 and ultrasound 07/19/2019 FINDINGS: Lower chest: The lung bases are grossly clear. No pleural or pericardial effusion. No worrisome pulmonary lesions. Hepatobiliary: Mild contour irregularity of the liver and slightly prominent hepatic fissures could suggest early changes of cirrhosis. No focal hepatic lesions or intrahepatic biliary dilatation. Peripheral wedge-shaped area of early arterial phase contrast enhancement is consistent with a benign vascular shunt. The portal and hepatic veins are patent. The gallbladder is slightly contracted but no gallstones. No common bile duct dilatation. Normal caliber and course of the common bile  duct without findings for common bile duct stones, ampullary lesion or pancreatic mass. Pancreas:  No mass, inflammation or ductal dilatation. Spleen:  Normal size.  No focal lesions. Adrenals/Urinary Tract: The adrenal glands and kidneys are unremarkable. No worrisome renal lesions or hydronephrosis. Stomach/Bowel: The stomach is distended with food and fluid. The duodenum is unremarkable. The visualized small bowel and colon are grossly normal. Vascular/Lymphatic: The aorta and branch vessels are patent. The major venous structures are patent. No mesenteric or retroperitoneal mass or adenopathy. Other:  No ascites or abdominal wall hernia. Musculoskeletal: No significant bony findings.  Moderate scoliosis. IMPRESSION: 1. Mild contour irregularity of the liver and slightly dilated hepatic fissures could suggest early changes of cirrhosis. 2. No hepatic lesions. 3. Normal pancreaticobiliary tree.  No biliary dilatation. 4. No abdominal mass or adenopathy. Electronically Signed   By: PMarijo SanesM.D.   On: 07/21/2019 07:58     Management plans discussed with the patient, family and they are in agreement.  CODE STATUS:     Code Status Orders  (From admission, onward)         Start     Ordered   07/19/19 2255  Full code  Continuous     07/19/19 2254        Code Status History    This patient has a current code status but no historical code status.   Advance Care Planning Activity      TOTAL TIME TAKING CARE OF THIS PATIENT: 38 minutes.    RGladstone LighterM.D on 07/21/2019 at 2:09 PM  Between 7am to 6pm - Pager - [phone]  After 6pm go to [email or web address] - [email or web address] [facility]  Office  [phone] CC: Primary care physician; BJuluis Pitch MD   Note: This dictation was prepared with Dragon dictation along with smaller phrase technology. Any transcriptional errors that result from this process are unintentional.

## 2019-07-22 LAB — CMV DNA BY PCR, QUALITATIVE: CMV DNA, Qual PCR: NEGATIVE

## 2019-07-23 LAB — EPSTEIN BARR VRS(EBV DNA BY PCR)
EBV DNA QN by PCR: NEGATIVE copies/mL
log10 EBV DNA Qn PCR: UNDETERMINED log10 copy/mL

## 2019-08-05 DIAGNOSIS — K759 Inflammatory liver disease, unspecified: Secondary | ICD-10-CM | POA: Insufficient documentation

## 2019-08-30 DIAGNOSIS — N812 Incomplete uterovaginal prolapse: Secondary | ICD-10-CM | POA: Insufficient documentation

## 2019-08-30 DIAGNOSIS — N3281 Overactive bladder: Secondary | ICD-10-CM | POA: Insufficient documentation

## 2019-08-30 DIAGNOSIS — N952 Postmenopausal atrophic vaginitis: Secondary | ICD-10-CM | POA: Insufficient documentation

## 2019-09-01 ENCOUNTER — Other Ambulatory Visit: Payer: Self-pay | Admitting: Family Medicine

## 2019-09-01 DIAGNOSIS — Z1231 Encounter for screening mammogram for malignant neoplasm of breast: Secondary | ICD-10-CM

## 2019-09-13 ENCOUNTER — Ambulatory Visit
Admission: RE | Admit: 2019-09-13 | Discharge: 2019-09-13 | Disposition: A | Discharge status: Home / Self Care | Payer: Medicare Other | Source: Ambulatory Visit | Attending: Family Medicine | Admitting: Family Medicine

## 2019-09-13 DIAGNOSIS — Z1231 Encounter for screening mammogram for malignant neoplasm of breast: Secondary | ICD-10-CM | POA: Insufficient documentation

## 2019-09-14 ENCOUNTER — Encounter: Payer: Self-pay | Admitting: Orthopedic Surgery

## 2019-10-14 HISTORY — PX: VAGINAL HYSTERECTOMY: SHX2639

## 2019-12-12 DIAGNOSIS — R748 Abnormal levels of other serum enzymes: Secondary | ICD-10-CM | POA: Insufficient documentation

## 2019-12-13 ENCOUNTER — Ambulatory Visit (INDEPENDENT_AMBULATORY_CARE_PROVIDER_SITE_OTHER): Payer: Medicare Other

## 2019-12-13 ENCOUNTER — Other Ambulatory Visit: Payer: Self-pay

## 2019-12-13 ENCOUNTER — Ambulatory Visit (INDEPENDENT_AMBULATORY_CARE_PROVIDER_SITE_OTHER): Payer: Medicare Other | Admitting: Podiatry

## 2019-12-13 DIAGNOSIS — B353 Tinea pedis: Secondary | ICD-10-CM

## 2019-12-13 MED ORDER — CLOTRIMAZOLE-BETAMETHASONE 1-0.05 % EX CREA: 1.0000 "application " | TOPICAL_CREAM | Freq: Two times a day (BID) | CUTANEOUS | 2 refills | Status: DC

## 2019-12-16 NOTE — Progress Notes (Signed)
   HPI: 74 y.o. female presenting today with a chief complaint of possible athlete's foot bilaterally that began over three years ago. She reports burning and itching of the skin. She has been using foot powder for treatment with minimal relief. There are no modifying factors noted. Patient is here for further evaluation and treatment.   Past Medical History:  Diagnosis Date  . Arthritis   . Colon polyp 1994  . Complication of anesthesia    nausea  . Cystitis   . Diabetes mellitus without complication (HCC)   . Diffuse cystic mastopathy   . Hypertension   . PONV (postoperative nausea and vomiting)    in distant past  . Pre-diabetes      Physical Exam: General: The patient is alert and oriented x3 in no acute distress.  Dermatology: Pruritus noted to the bilateral feet with hyperkeratosis. Skin is warm, dry and supple bilateral lower extremities. Negative for open lesions or macerations.  Vascular: Palpable pedal pulses bilaterally. No edema or erythema noted. Capillary refill within normal limits.  Neurological: Epicritic and protective threshold grossly intact bilaterally.   Musculoskeletal Exam: Range of motion within normal limits to all pedal and ankle joints bilateral. Muscle strength 5/5 in all groups bilateral.   Radiographic Exam:  Normal osseous mineralization. Joint spaces preserved. No fracture/dislocation/boney destruction.    Assessment: 1. Tinea pedis bilateral   Plan of Care:  1. Patient evaluated. X-Rays reviewed.  2. Prescription for Lotrisone cream provided to patient.  3. Return to clinic as needed.       Felecia Shelling, DPM [facility]  Dr. Felecia Shelling, DPM    [address] [place], [place] [address]                Office [phone]  Fax [phone]

## 2020-01-02 IMAGING — MR MR ABDOMEN WO/W CM MRCP
18 of 20 series · 44 of 48 positions shown · IV contrast (6ml Gadavist)
Comparison: CT scan 07/20/2019 and ultrasound 07/19/2019

CLINICAL DATA: Painless jaundice.  Weight loss.

EXAM:
MRI ABDOMEN WITHOUT AND WITH CONTRAST (INCLUDING MRCP)
TECHNIQUE: Multiplanar multisequence MR imaging of the abdomen was performed
both before and after the administration of intravenous contrast.
Heavily T2-weighted images of the biliary and pancreatic ducts were
obtained, and three-dimensional MRCP images were rendered by post
processing.
CONTRAST:  6mL GADAVIST GADOBUTROL 1 MMOL/ML IV SOLN

[Series 3: T2 · coronal · 6.0mm · 1.00mm/px · 2 of 27 slices shown (1 of 2)]
[im 1/27]
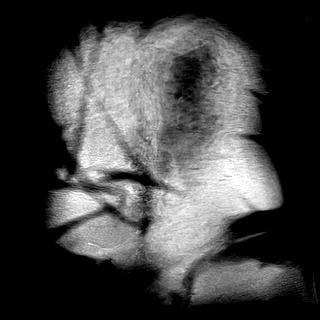
[im 27/27]
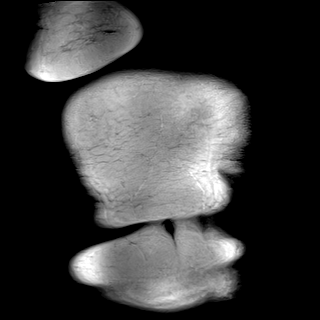

[Series 4: T2 · axial · 6.0mm · 1.00mm/px · 1 of 32 slices shown (2 of 2)]
[im 1/32]
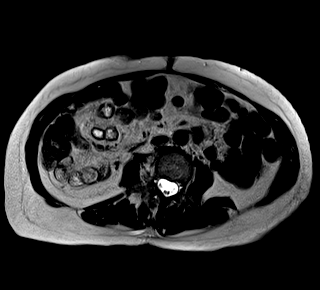

[Series 5: T1 · axial · 6.0mm · 0.62mm/px · z∈[-98,+125]mm · 3 of 64 slices shown]
[im 1/64]
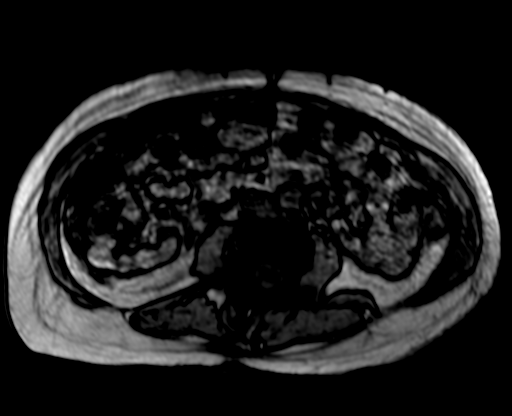
[im 32/64]
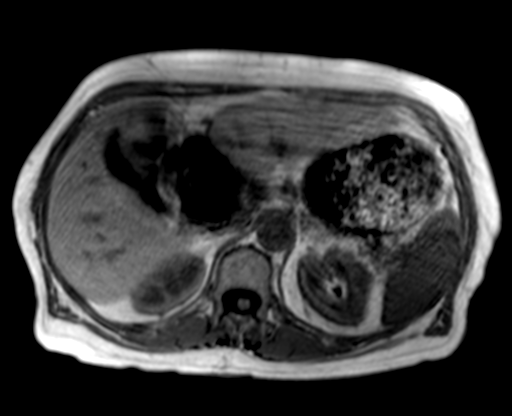
[im 64/64]
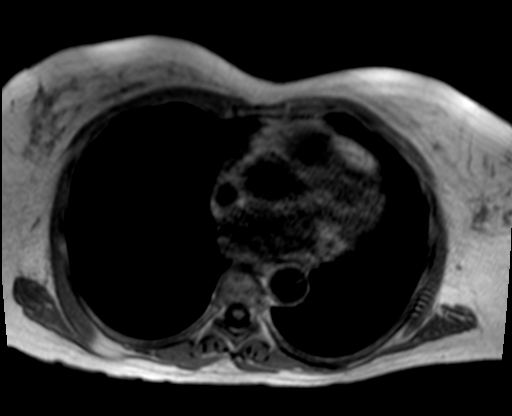

[Series 8: T2 fat-sat · axial · 6.0mm · 1.00mm/px · 1 of 32 slices shown]
[im 1/32]
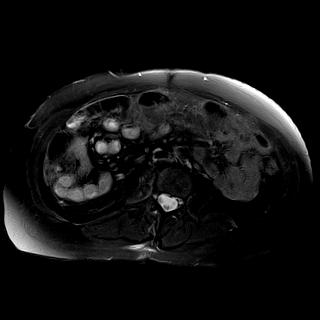

[Series 9: ax dwi_tracew · axial · 6.0mm · 1.27mm/px · z∈[-98,+125]mm · 4 of 96 slices shown]
[im 1/96]
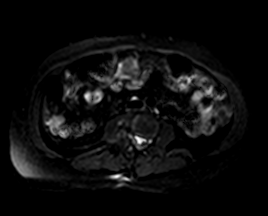
[im 32/96]
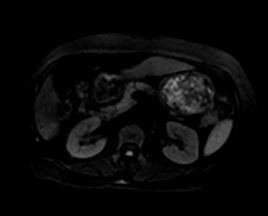
[im 64/96]
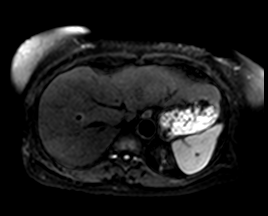
[im 96/96]
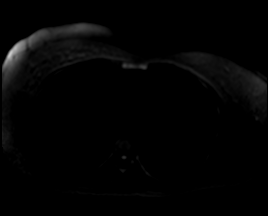

[Series 10: ax dwi_adc · axial · 6.0mm · 1.27mm/px · 1 of 32 slices shown]
[im 1/32]
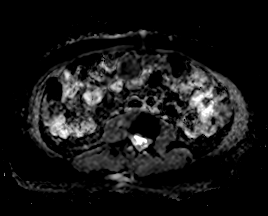

[Series 15: MRCP · coronal · 3.0mm · 1.12mm/px · 1 of 21 slices shown]
[im 1/21]
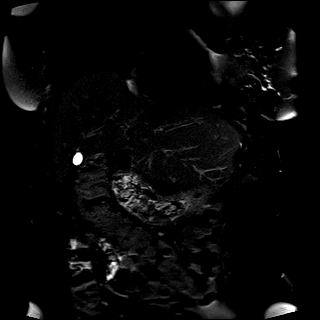

[Series 16: radials · coronal · 50.0mm · 0.78mm/px · 1 of 5 slices shown]
[im 1/5]
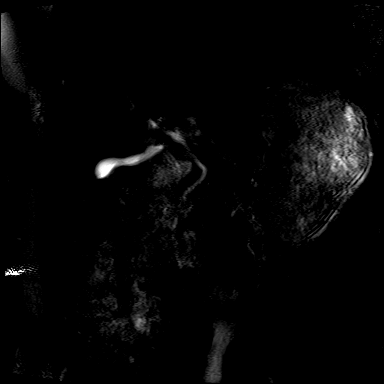

[Series 17: T1 dynamic fat-sat · axial · non-contrast · 3.0mm · 1.19mm/px · z∈[-105,+132]mm · 3 of 80 slices shown (1 of 5)]
[im 1/80]
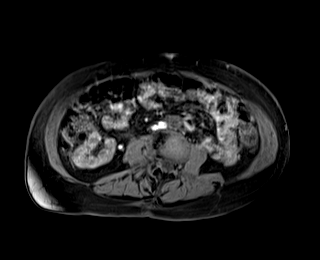
[im 40/80]
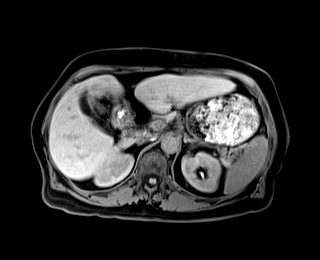
[im 80/80]
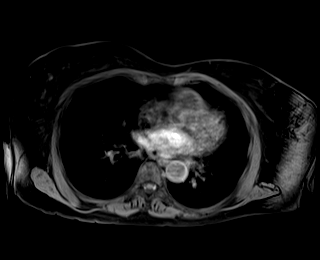

[Series 18: T1 dynamic fat-sat post-contrast · axial · 3.0mm · 1.19mm/px · z∈[-105,+132]mm · 3 of 80 slices shown (1 of 4)]
[im 1/80]
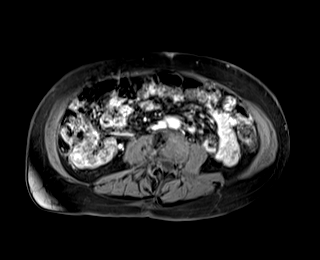
[im 40/80]
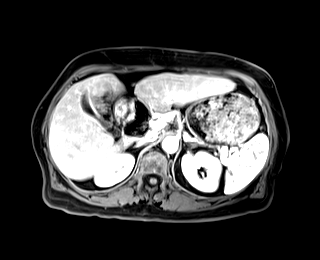
[im 80/80]
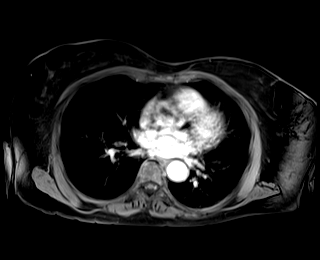

[Series 19: T1 dynamic fat-sat · axial · 3.0mm · 1.19mm/px · z∈[-105,+132]mm · 3 of 79 slices shown (2 of 5)]
[im 1/79]
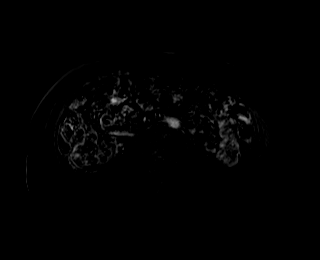
[im 40/79]
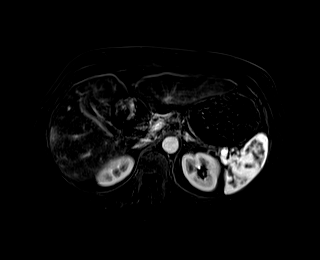
[im 79/79]
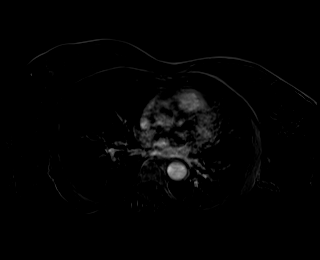

[Series 20: T1 dynamic fat-sat post-contrast · axial · 3.0mm · 1.19mm/px · z∈[-105,+132]mm · 3 of 80 slices shown (2 of 4)]
[im 1/80]
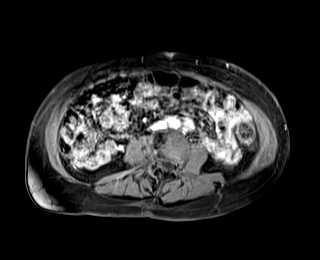
[im 40/80]
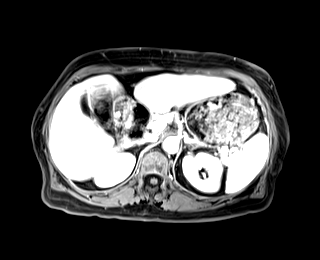
[im 80/80]
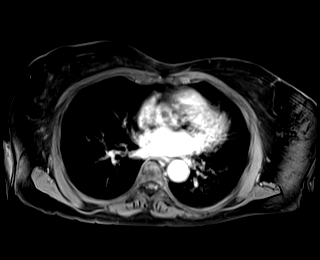

[Series 21: T1 dynamic fat-sat · axial · 3.0mm · 1.19mm/px · z∈[-105,+132]mm · 3 of 80 slices shown (3 of 5)]
[im 1/80]
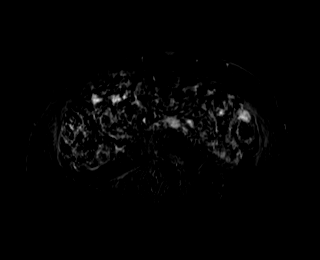
[im 40/80]
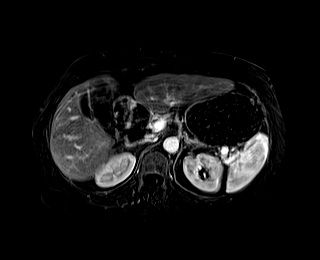
[im 80/80]
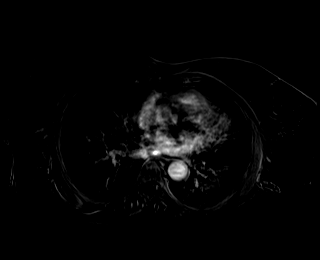

[Series 22: T1 dynamic fat-sat post-contrast · axial · 3.0mm · 1.19mm/px · z∈[-105,+132]mm · 3 of 80 slices shown (3 of 4)]
[im 1/80]
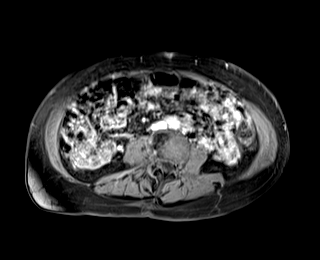
[im 40/80]
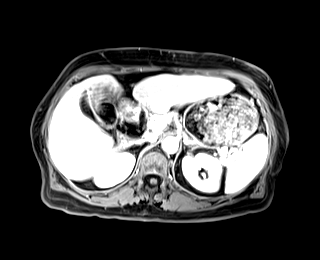
[im 80/80]
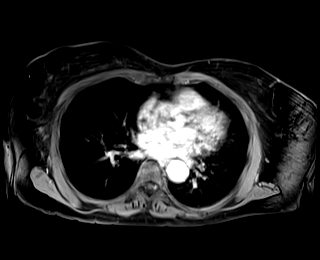

[Series 23: T1 dynamic fat-sat · axial · 3.0mm · 1.19mm/px · z∈[-105,+132]mm · 3 of 80 slices shown (4 of 5)]
[im 1/80]
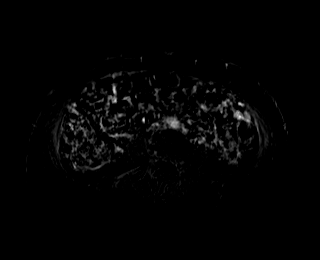
[im 40/80]
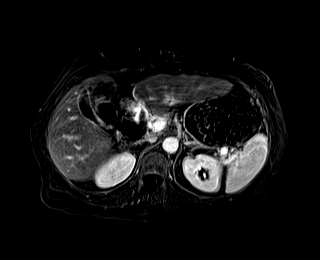
[im 80/80]
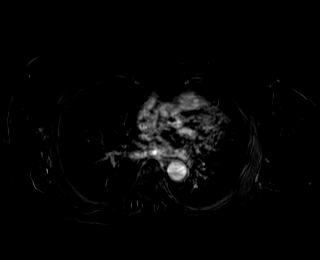

[Series 24: T1 dynamic post-contrast · coronal · 3.0mm · 1.12mm/px · 3 of 64 slices shown]
[im 1/64]
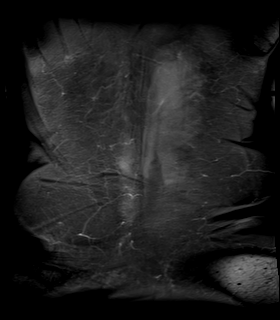
[im 32/64]
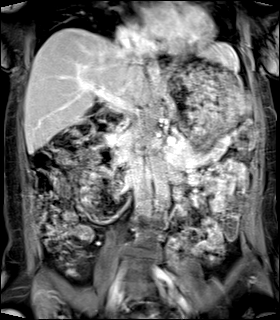
[im 64/64]
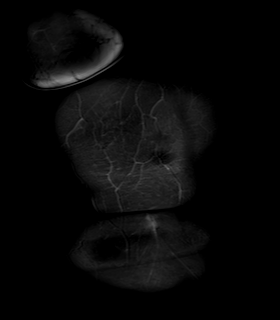

[Series 25: T1 dynamic fat-sat post-contrast · axial · 3.0mm · 1.19mm/px · z∈[-105,+132]mm · 3 of 80 slices shown (4 of 4)]
[im 1/80]
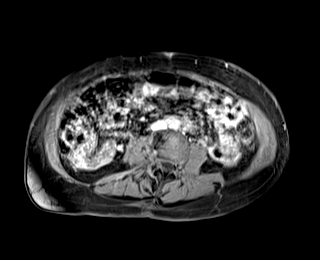
[im 40/80]
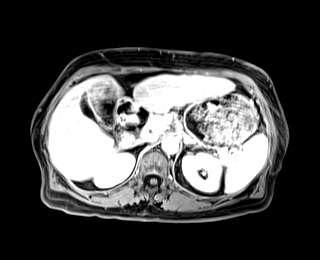
[im 80/80]
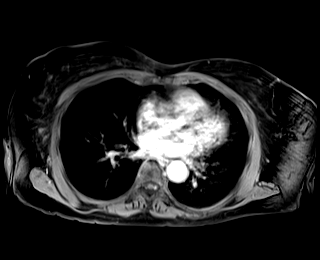

[Series 26: T1 dynamic fat-sat · axial · 3.0mm · 1.19mm/px · z∈[-105,+132]mm · 3 of 80 slices shown (5 of 5)]
[im 1/80]
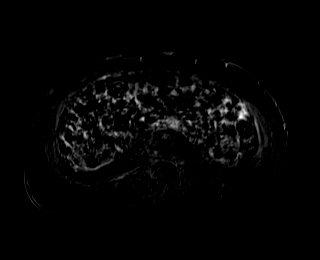
[im 40/80]
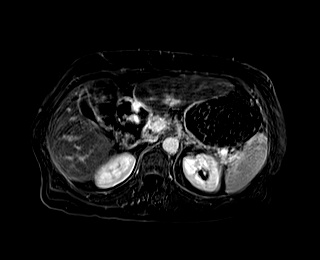
[im 80/80]
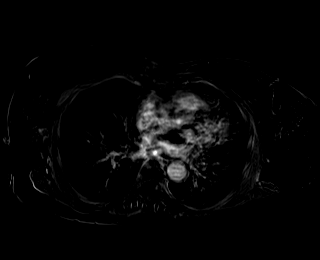

[44 of 48 positions shown; findings below may reference images not displayed]

FINDINGS: Lower chest: The lung bases are grossly clear. No pleural or
pericardial effusion. No worrisome pulmonary lesions.

Hepatobiliary: Mild contour irregularity of the liver and slightly
prominent hepatic fissures could suggest early changes of cirrhosis.
No focal hepatic lesions or intrahepatic biliary dilatation.
Peripheral wedge-shaped area of early arterial phase contrast
enhancement is consistent with a benign vascular shunt. The portal
and hepatic veins are patent.

The gallbladder is slightly contracted but no gallstones. No common
bile duct dilatation. Normal caliber and course of the common bile
duct without findings for common bile duct stones, ampullary lesion
or pancreatic mass.

Pancreas:  No mass, inflammation or ductal dilatation.

Spleen:  Normal size.  No focal lesions.

Adrenals/Urinary Tract: The adrenal glands and kidneys are
unremarkable. No worrisome renal lesions or hydronephrosis.

Stomach/Bowel: The stomach is distended with food and fluid. The
duodenum is unremarkable. The visualized small bowel and colon are
grossly normal.

Vascular/Lymphatic: The aorta and branch vessels are patent. The
major venous structures are patent. No mesenteric or retroperitoneal
mass or adenopathy.

Other:  No ascites or abdominal wall hernia.

Musculoskeletal: No significant bony findings.  Moderate scoliosis.
IMPRESSION: 1. Mild contour irregularity of the liver and slightly dilated
hepatic fissures could suggest early changes of cirrhosis.
2. No hepatic lesions.
3. Normal pancreaticobiliary tree.  No biliary dilatation.
4. No abdominal mass or adenopathy.

## 2020-07-18 ENCOUNTER — Other Ambulatory Visit: Payer: Self-pay | Admitting: Podiatry

## 2020-08-06 ENCOUNTER — Other Ambulatory Visit: Payer: Self-pay | Admitting: Family Medicine

## 2020-08-06 DIAGNOSIS — Z1231 Encounter for screening mammogram for malignant neoplasm of breast: Secondary | ICD-10-CM

## 2020-09-13 ENCOUNTER — Ambulatory Visit
Admission: RE | Admit: 2020-09-13 | Discharge: 2020-09-13 | Disposition: A | Discharge status: Home / Self Care | Payer: Medicare Other | Source: Ambulatory Visit | Attending: Family Medicine | Admitting: Family Medicine

## 2020-09-13 ENCOUNTER — Other Ambulatory Visit: Payer: Self-pay

## 2020-09-13 DIAGNOSIS — Z1231 Encounter for screening mammogram for malignant neoplasm of breast: Secondary | ICD-10-CM | POA: Diagnosis not present

## 2020-10-31 ENCOUNTER — Other Ambulatory Visit: Payer: Self-pay | Admitting: Podiatry

## 2020-11-21 ENCOUNTER — Other Ambulatory Visit: Payer: Self-pay | Admitting: Orthopedic Surgery

## 2020-11-21 DIAGNOSIS — M4807 Spinal stenosis, lumbosacral region: Secondary | ICD-10-CM

## 2020-11-21 DIAGNOSIS — M5441 Lumbago with sciatica, right side: Secondary | ICD-10-CM

## 2020-11-21 DIAGNOSIS — G8929 Other chronic pain: Secondary | ICD-10-CM

## 2020-12-01 ENCOUNTER — Ambulatory Visit
Admission: RE | Admit: 2020-12-01 | Discharge: 2020-12-01 | Disposition: A | Discharge status: Home / Self Care | Payer: Medicare Other | Source: Ambulatory Visit | Attending: Orthopedic Surgery | Admitting: Orthopedic Surgery

## 2020-12-01 ENCOUNTER — Other Ambulatory Visit: Payer: Self-pay

## 2020-12-01 DIAGNOSIS — M4807 Spinal stenosis, lumbosacral region: Secondary | ICD-10-CM | POA: Diagnosis present

## 2020-12-01 DIAGNOSIS — M5441 Lumbago with sciatica, right side: Secondary | ICD-10-CM | POA: Insufficient documentation

## 2020-12-01 DIAGNOSIS — G8929 Other chronic pain: Secondary | ICD-10-CM | POA: Insufficient documentation

## 2020-12-29 ENCOUNTER — Other Ambulatory Visit: Payer: Self-pay | Admitting: Podiatry

## 2021-03-06 ENCOUNTER — Ambulatory Visit (INDEPENDENT_AMBULATORY_CARE_PROVIDER_SITE_OTHER): Payer: Medicare Other | Admitting: Urology

## 2021-03-06 ENCOUNTER — Encounter: Payer: Self-pay | Admitting: Urology

## 2021-03-06 ENCOUNTER — Other Ambulatory Visit: Payer: Self-pay

## 2021-03-06 VITALS — BP 120/80 | HR 76 | Ht 60.5 in | Wt 139.0 lb

## 2021-03-06 DIAGNOSIS — N390 Urinary tract infection, site not specified: Secondary | ICD-10-CM

## 2021-03-06 DIAGNOSIS — N952 Postmenopausal atrophic vaginitis: Secondary | ICD-10-CM | POA: Diagnosis not present

## 2021-03-06 DIAGNOSIS — N3281 Overactive bladder: Secondary | ICD-10-CM

## 2021-03-06 DIAGNOSIS — K5909 Other constipation: Secondary | ICD-10-CM | POA: Diagnosis not present

## 2021-03-06 LAB — BLADDER SCAN AMB NON-IMAGING: Scan Result: 73

## 2021-03-06 NOTE — Patient Instructions (Signed)
Cranberry tablets twice daily, daily probiotic. Continue estrogen cream.  Work on constipation.  Cystoscopy Cystoscopy is a procedure that is used to help diagnose and sometimes treat conditions that affect the lower urinary tract. The lower urinary tract includes the bladder and the urethra. The urethra is the tube that drains urine from the bladder. Cystoscopy is done using a thin, tube-shaped instrument with a light and camera at the end (cystoscope). The cystoscope may be hard or flexible, depending on the goal of the procedure. The cystoscope is inserted through the urethra, into the bladder. Cystoscopy may be recommended if you have:  Urinary tract infections that keep coming back.  Blood in the urine (hematuria).  An inability to control when you urinate (urinary incontinence) or an overactive bladder.  Unusual cells found in a urine sample.  A blockage in the urethra, such as a urinary stone.  Painful urination.  An abnormality in the bladder found during an intravenous pyelogram (IVP) or CT scan. Cystoscopy may also be done to remove a sample of tissue to be examined under a microscope (biopsy). What are the risks? Generally, this is a safe procedure. However, problems may occur, including:  Infection.  Bleeding.  What happens during the procedure?  1. You will be given one or more of the following: ? A medicine to numb the area (local anesthetic). 2. The area around the opening of your urethra will be cleaned. 3. The cystoscope will be passed through your urethra into your bladder. 4. Germ-free (sterile) fluid will flow through the cystoscope to fill your bladder. The fluid will stretch your bladder so that your health care provider can clearly examine your bladder walls. 5. Your doctor will look at the urethra and bladder. 6. The cystoscope will be removed The procedure may vary among health care providers  What can I expect after the procedure? After the procedure,  it is common to have: 1. Some soreness or pain in your abdomen and urethra. 2. Urinary symptoms. These include: ? Mild pain or burning when you urinate. Pain should stop within a few minutes after you urinate. This may last for up to 1 week. ? A small amount of blood in your urine for several days. ? Feeling like you need to urinate but producing only a small amount of urine. Follow these instructions at home: General instructions  Return to your normal activities as told by your health care provider.   Do not drive for 24 hours if you were given a sedative during your procedure.  Watch for any blood in your urine. If the amount of blood in your urine increases, call your health care provider.  If a tissue sample was removed for testing (biopsy) during your procedure, it is up to you to get your test results. Ask your health care provider, or the department that is doing the test, when your results will be ready.  Drink enough fluid to keep your urine pale yellow.  Keep all follow-up visits as told by your health care provider. This is important. Contact a health care provider if you:  Have pain that gets worse or does not get better with medicine, especially pain when you urinate.  Have trouble urinating.  Have more blood in your urine. Get help right away if you:  Have blood clots in your urine.  Have abdominal pain.  Have a fever or chills.  Are unable to urinate. Summary  Cystoscopy is a procedure that is used to help diagnose and  sometimes treat conditions that affect the lower urinary tract.  Cystoscopy is done using a thin, tube-shaped instrument with a light and camera at the end.  After the procedure, it is common to have some soreness or pain in your abdomen and urethra.  Watch for any blood in your urine. If the amount of blood in your urine increases, call your health care provider.  If you were prescribed an antibiotic medicine, take it as told by your  health care provider. Do not stop taking the antibiotic even if you start to feel better. This information is not intended to replace advice given to you by your health care provider. Make sure you discuss any questions you have with your health care provider. Document Revised: 09/21/2018 Document Reviewed: 09/21/2018 Elsevier Patient Education  2020 [employer].

## 2021-03-06 NOTE — Progress Notes (Signed)
03/06/2021 1:07 PM   Marrion R Mastropietro 1945-12-30 [id number]  Referring provider: Dorothey Baseman, MD [phone] [place],  [place] [address]  Chief Complaint  Patient presents with  . Urinary Tract Infection    HPI: 75 year old female referred for further evaluation of recurrent urinary tract infections.  Over the past year, she has been treated for for urinary tract infections.  These were on 06/2019, 11/2020, 12/2020 and 02/2021.  Each time, the urine is frankly positive.  She grew E. coli twice and Klebsiella twice.  Most recent upper tract imaging in the form of contrast CT scan in 07/2021 shows no GU pathology, no stones.  She does have evidence of cirrhosis.  She has a personal history of pelvic organ prolapse.  It was previously managed by pessary but more recently, she elected surgery.  She did undergo uterine prolapse surgery on 02/2020.  This was done at [facility].  This included total vaginal hysterectomy, right salpingectomy, uterosacral ligament suspension, anterior colporrhaphy, posterior colporrhaphy, cystoscopy  . Following the procedure, she did develop vaginal adhesions for which she was started on estrogen cream.  This was twice a day.  Is recently been increased to 3 times a day by her PCP.  She does not recall having a stress urinary incontinence prior to the procedure.  She does this today.  She does have some urgency frequency and occasional urge incontinence.  She is on Vesicare 10 mg for this which helps.  She does have chronic constipation.  In the past, she has been able to manage this with MiraLAX but this no longer is working.  She has been taking Dulcolax and nighttime sometimes to help her more recently was prescribed a new version of lactulose.  She has not started taking this because she does not know what time of day its best to take it.  No pain with intercourse.  UTIs are unrelated to intercourse.  She does feel like she is emptying her bladder for the  most part.  PVR today around 75 cc.   PMH: Past Medical History:  Diagnosis Date  . Arthritis   . Colon polyp 1994  . Complication of anesthesia    nausea  . Cystitis   . Diabetes mellitus without complication (HCC)   . Diffuse cystic mastopathy   . Hypertension   . PONV (postoperative nausea and vomiting)    in distant past  . Pre-diabetes     Surgical History: Past Surgical History:  Procedure Laterality Date  . bladder prolapse surgery    . BREAST BIOPSY Bilateral    neg  . BREAST CYST ASPIRATION Right    neg  . BREAST LUMPECTOMY Right 1976  . BREAST SURGERY Left 2002   excision  . COLONOSCOPY  2011, 09/2013  . COLONOSCOPY WITH PROPOFOL N/A 10/29/2018   Procedure: COLONOSCOPY WITH PROPOFOL;  Surgeon: Scot Jun, MD;  Location: [facility] ENDOSCOPY;  Service: Endoscopy;  Laterality: N/A;  . FINGER ARTHROPLASTY Right 03/24/2018   Procedure: FINGER ARTHROPLASTY (CMC TENDON INTERPOSITIONAL ARTHROPLASTY);  Surgeon: Erin Sons, MD;  Location: [facility] ORS;  Service: Orthopedics;  Laterality: Right;  . HAND SURGERY Left July 2013   thumb  . INCISION AND DRAINAGE HIP Right 06/17/2019   Procedure: right open gluteus medius repair, it band release, trochanteric bursectomy;  Surgeon: Signa Kell, MD;  Location: [facility] ORS;  Service: Orthopedics;  Laterality: Right;  . laparotomy     exploratory r/t liver  . LASER ABLATION Bilateral Dec 2015  Dr Wyn Quaker  . POLYPECTOMY  1994  . TUBAL LIGATION  1976    Home Medications:  Allergies as of 03/06/2021      Reactions   Cefazolin Other (See Comments)      Medication List       Accurate as of Mar 06, 2021  1:07 PM. If you have any questions, ask your nurse or doctor.        STOP taking these medications   amLODipine 5 MG tablet Commonly known as: NORVASC Stopped by: Vanna Scotland, MD   aspirin EC 81 MG tablet Stopped by: Vanna Scotland, MD   Calcium Carbonate-Vitamin D 600-400 MG-UNIT tablet Stopped by: Vanna Scotland,  MD   cholecalciferol 1000 units tablet Commonly known as: VITAMIN D Stopped by: Vanna Scotland, MD   hydrochlorothiazide 25 MG tablet Commonly known as: HYDRODIURIL Stopped by: Vanna Scotland, MD   solifenacin 10 MG tablet Commonly known as: VESICARE Stopped by: Vanna Scotland, MD   vitamin E 180 MG (400 UNITS) capsule Stopped by: Vanna Scotland, MD     TAKE these medications   calcium citrate-vitamin D 315-200 MG-UNIT tablet Commonly known as: CITRACAL+D Take by mouth.   CENTRUM SILVER PO Take 1 tablet by mouth daily.   clotrimazole-betamethasone cream Commonly known as: LOTRISONE APPLY TOPICALLY TWICE DAILY   Constulose 10 GM/15ML solution Generic drug: lactulose   docusate sodium 50 MG capsule Commonly known as: COLACE Take by mouth. What changed: Another medication with the same name was removed. Continue taking this medication, and follow the directions you see here. Changed by: Vanna Scotland, MD   ibuprofen 400 MG tablet Commonly known as: ADVIL Take 1 tablet (400 mg total) by mouth every 6 (six) hours as needed for fever, headache or moderate pain.   losartan-hydrochlorothiazide 50-12.5 MG tablet Commonly known as: HYZAAR Take 1 tablet by mouth daily.   oxyCODONE 5 MG immediate release tablet Commonly known as: Oxy IR/ROXICODONE Take by mouth.   SYSTANE COMPLETE OP Place 1 drop into both eyes 2 (two) times daily.       Allergies:  Allergies  Allergen Reactions  . Cefazolin Other (See Comments)    Family History: Family History  Problem Relation Age of Onset  . Cancer Father        lung  . Cancer Mother        colon  . Cancer Sister        lung  . Pulmonary fibrosis Brother   . Pulmonary fibrosis Sister   . Breast cancer Neg Hx     Social History:  reports that she has never smoked. She has never used smokeless tobacco. She reports that she does not drink alcohol and does not use drugs.   Physical Exam: BP 120/80   Pulse 76    Ht 5' 0.5" (1.537 m)   Wt 139 lb (63 kg)   BMI 26.70 kg/m   Constitutional:  Alert and oriented, No acute distress. HEENT: [place] AT, moist mucus membranes.  Trachea midline, no masses. Cardiovascular: No clubbing, cyanosis, or edema. Respiratory: Normal respiratory effort, no increased work of breathing. Skin: No rashes, bruises or suspicious lesions. Neurologic: Grossly intact, no focal deficits, moving all 4 extremities. Psychiatric: Normal mood and affect.  Laboratory Data: Lab Results  Component Value Date   WBC 5.7 07/20/2019   HGB 12.0 07/20/2019   HCT 33.5 (L) 07/20/2019   MCV 91.5 07/20/2019   PLT 177 07/20/2019    Lab Results  Component Value Date  CREATININE 0.41 (L) 07/21/2019    Urinalysis UA today with 10-30 white blood cells, no red blood cells, few bacteria, nitrate negative.  Pertinent Imaging: Results for orders placed or performed in visit on 03/06/21  BLADDER SCAN AMB NON-IMAGING  Result Value Ref Range   Scan Result 73 ml     Assessment & Plan:    1. Recurrent UTI New onset recurrent urinary tract infections following pelvic organ prolapse surgery and mid urethral sling placement  I have recommended consideration of cystoscopy to ensure that there is no anatomic issues related to this  She seems to be emptying her bladder well with minimal PVR today.  Recommend continuing topical estrogen cream, at least 3 times a week.  In addition to the above, I have also recommended cranberry tablets twice daily as well as a daily probiotic for UTI prevention.  I have also recommended that she inform her your gynecologist about what is going on.  They may want to intervene or at least be aware of her issues postop.  - Urinalysis, Complete - BLADDER SCAN AMB NON-IMAGING  2. Overactive bladder Managed on Vesicare 10 mg, continue this medication as needed  3. Vaginal atrophy Continue topical estrogen cream, increase to 3 times per week, previous using twice  per week  4. Chronic constipation Lifelong history of chronic constipation, no longer responding to MiraLAX  She has been prescribed lactulose which she is yet to start taking, encouraged her to work with her PCP on this regimen  Schedule cysto  Vanna Scotland, MD  [facility] [address], [address] [place], [place] [address] [phone]

## 2021-03-07 LAB — URINALYSIS, COMPLETE
Bilirubin, UA: NEGATIVE
Glucose, UA: NEGATIVE
Ketones, UA: NEGATIVE
Nitrite, UA: NEGATIVE
Protein,UA: NEGATIVE
RBC, UA: NEGATIVE
Specific Gravity, UA: 1.015 (ref 1.005–1.030)
Urobilinogen, Ur: 0.2 mg/dL (ref 0.2–1.0)
pH, UA: 6.5 (ref 5.0–7.5)

## 2021-03-07 LAB — MICROSCOPIC EXAMINATION

## 2021-03-13 ENCOUNTER — Other Ambulatory Visit: Payer: Self-pay | Admitting: Podiatry

## 2021-03-13 NOTE — Telephone Encounter (Signed)
Please advise 

## 2021-03-26 ENCOUNTER — Other Ambulatory Visit: Payer: Self-pay

## 2021-03-26 ENCOUNTER — Ambulatory Visit (INDEPENDENT_AMBULATORY_CARE_PROVIDER_SITE_OTHER): Payer: Medicare Other | Admitting: Urology

## 2021-03-26 VITALS — BP 162/79 | HR 81 | Ht 61.0 in | Wt 141.0 lb

## 2021-03-26 DIAGNOSIS — N390 Urinary tract infection, site not specified: Secondary | ICD-10-CM | POA: Diagnosis not present

## 2021-03-26 MED ORDER — SULFAMETHOXAZOLE-TRIMETHOPRIM 800-160 MG PO TABS
1.0000 | ORAL_TABLET | Freq: Two times a day (BID) | ORAL | Status: DC
  Administered 2021-03-26: 1 via ORAL

## 2021-03-26 NOTE — Progress Notes (Signed)
   03/26/21  CC:  Chief Complaint  Patient presents with   Cysto    HPI: 75 year old female who presents today for cystoscopic evaluation.  Please see previous notes for details.  She has had several UTIs following pelvic organ prolapse/SUI surgery.  Since last visit, she had no further issues.  Blood pressure (!) 162/79, pulse 81, height 5\' 1"  (1.549 m), weight 141 lb (64 kg). NED. A&Ox3.   No respiratory distress   Abd soft, NT, ND Normal external genitalia with patent urethral meatus  In her history of recurrent UTIs, she was administered a periprocedural dose of Bactrim today.  Cystoscopy Procedure Note  Patient identification was confirmed, informed consent was obtained, and patient was prepped using Betadine solution.  Lidocaine jelly was administered per urethral meatus.    Procedure: - Flexible cystoscope introduced, without any difficulty.   - Thorough search of the bladder revealed:    normal urethral meatus    normal urothelium    no stones    no ulcers     no tumors    no urethral polyps    no trabeculation  - Ureteral orifices were normal in position and appearance.  I did have to manually reduce the fold near the level of her trigone by inserting my finger into the vagina and lifting her bladder superiorly in order to fully evaluate this area which was unremarkable.  Post-Procedure: - Patient tolerated the procedure well  Assessment/ Plan:  1. Recurrent UTI No evidence of mesh erosion underlying structural or anatomic issues contributing to the aforementioned diagnosis  Continue topical estrogen cream and manage constipation appropriately  - Urinalysis, Complete - sulfamethoxazole-trimethoprim (BACTRIM DS) 800-160 MG per tablet 1 tablet   F/u as needed   , MD

## 2021-03-27 LAB — MICROSCOPIC EXAMINATION: Bacteria, UA: NONE SEEN

## 2021-03-27 LAB — URINALYSIS, COMPLETE
Bilirubin, UA: NEGATIVE
Glucose, UA: NEGATIVE
Ketones, UA: NEGATIVE
Leukocytes,UA: NEGATIVE
Nitrite, UA: NEGATIVE
Protein,UA: NEGATIVE
RBC, UA: NEGATIVE
Specific Gravity, UA: 1.01 (ref 1.005–1.030)
Urobilinogen, Ur: 0.2 mg/dL (ref 0.2–1.0)
pH, UA: 6.5 (ref 5.0–7.5)

## 2021-06-13 ENCOUNTER — Other Ambulatory Visit: Payer: Self-pay | Admitting: Podiatry

## 2021-08-13 DIAGNOSIS — R748 Abnormal levels of other serum enzymes: Secondary | ICD-10-CM

## 2021-08-13 HISTORY — DX: Abnormal levels of other serum enzymes: R74.8

## 2021-08-22 ENCOUNTER — Other Ambulatory Visit: Payer: Self-pay | Admitting: Neurosurgery

## 2021-08-23 ENCOUNTER — Encounter (HOSPITAL_COMMUNITY): Payer: Self-pay | Admitting: Neurosurgery

## 2021-08-23 ENCOUNTER — Other Ambulatory Visit
Admission: RE | Admit: 2021-08-23 | Discharge: 2021-08-23 | Disposition: A | Discharge status: Home / Self Care | Payer: Medicare Other | Source: Ambulatory Visit | Attending: Neurosurgery | Admitting: Neurosurgery

## 2021-08-23 ENCOUNTER — Other Ambulatory Visit: Payer: Self-pay

## 2021-08-23 DIAGNOSIS — Z01812 Encounter for preprocedural laboratory examination: Secondary | ICD-10-CM | POA: Diagnosis present

## 2021-08-23 DIAGNOSIS — Z20822 Contact with and (suspected) exposure to covid-19: Secondary | ICD-10-CM | POA: Insufficient documentation

## 2021-08-23 LAB — SARS CORONAVIRUS 2 (TAT 6-24 HRS): SARS Coronavirus 2: NEGATIVE

## 2021-08-26 ENCOUNTER — Encounter (HOSPITAL_COMMUNITY): Payer: Self-pay | Admitting: Neurosurgery

## 2021-08-26 ENCOUNTER — Other Ambulatory Visit: Payer: Self-pay

## 2021-08-26 NOTE — Progress Notes (Signed)
Anesthesia Chart Review:  Case: [id number] Date/Time: 08/27/21 1015   Procedure: PLIF - L3-L4 - L4-L5 (Back)   Anesthesia type: General   Pre-op diagnosis: Spondylolisthesis   [facility] / [facility]   Surgeons: Julio Sicks, MD       DISCUSSION: Patient is a 75 year old female scheduled for the above procedure.  History includes never smoker, postoperative N/V, HTN, DM2, right hip gluteus medius repair & trochanteric bursectomy (06/17/19), transaminitis/drug induced liver injury (07/2019, liver biopsy 08/18/19: moderate canalicular and biliary cholestasis, patchy mild perisinusoidal centrilobular fibrosis, overall histopathologic findings are compatible with, although not specific for, the clinical impression of the effects of medications/drugs. No definitive evidence of acute large duct obstruction is identified. No evidence of acute on chronic liver disease is identified. Negative for advanced fibrosis; culprit of DILI felt to be cefazolin), hysterectomy (with right salpingectomy, A/P colporrhaphy 03/02/20)  She had a Hepatic Function Panel on 08/20/21 at [facility]. Results showed total protein 6.5, albumin 4.3, total bilirubin 1.8, conjugated bilirubin 0.29, alkaline phosphatase 74, AST 22, ALT 23.  Previously total bilirubin had been 2.2 07/10/21, 1.1 (with conjugated bili 0.21) 12/05/20, 1.3 (conjugated bili 0.24) 09/04/20, 1.7 (conjugated bilb 0.26) 07/31/20.   08/23/2021 presurgical COVID-19 test negative.  She is a same-day work-up, so labs and EKG as indicated on arrival.  Anesthesia team to evaluate on the day of surgery.   VS: Ht 5\' 1"  (1.549 m)   Wt 63 kg   BMI 26.26 kg/m    BP Readings from Last 3 Encounters:  03/26/21 (!) 162/79  03/06/21 120/80  07/21/19 (!) 165/75   Pulse Readings from Last 3 Encounters:  03/26/21 81  03/06/21 76  07/21/19 81     PROVIDERS: 09/20/19, MD is PCP  - Dorothey Baseman, MD is hepatologist [facility] Everywhere). Recommended avoid  cefazolin, as felt to be the culprit of DILI in 2020. According to pre-operative anesthesia evaluation by 2021, PA on 02/24/20, "01/12/20 labs show normal liver function, per hepatology ok to proceed with surgery. Per Dr. 03/13/20 gentamycin or clindamycin should be ok to use in the perioperative period since there is no 'cross reactivity' between these agents and cefazolin." - Waynard Reeds, MD is urologist. Cystoscopy 03/26/21 for recurrent UTI that showed "No evidence of mesh erosion underlying structural or anatomic issues contributing to the aforementioned diagnosis".  Scribe Bactrim DS.  As needed follow-up.   LABS: For day of surgery as indicated. As above HFP 08/20/21. CBC, CMET, TSH, A1c 07/10/21 in [facility]. TSH 2.541, A1c 6.2%, H/H 14.0/39.4, PLT 198,  Cr 0.7.   IMAGES: MRI L-spine 12/01/20: IMPRESSION: 1. 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 2. Left foraminal narrowing at L4-5 and L5-S1 without static compression. 3. L3-4 right subarticular recess narrowing without definite compression.    EKG: Last EKG seen in > 1 year ago.    CV: N/A  Past Medical History:  Diagnosis Date   Arthritis    Colon polyp 1994   Complication of anesthesia    nausea   Cystitis    Diabetes mellitus without complication (HCC)    no meds   Diffuse cystic mastopathy    Elevated liver enzymes 08/2021   Hypertension    PONV (postoperative nausea and vomiting)    in distant past    Past Surgical History:  Procedure Laterality Date   bladder prolapse surgery     x 2 - 2021, 2022  BREAST BIOPSY Bilateral    neg   BREAST CYST ASPIRATION Right    neg   BREAST LUMPECTOMY Right 1976   BREAST SURGERY Left 2002   excision   COLONOSCOPY  2011, 09/2013   COLONOSCOPY WITH PROPOFOL N/A 10/29/2018   Procedure: COLONOSCOPY WITH PROPOFOL;  Surgeon: Scot Jun, MD;  Location: [facility] ENDOSCOPY;  Service: Endoscopy;  Laterality:  N/A;   FINGER ARTHROPLASTY Right 03/24/2018   Procedure: FINGER ARTHROPLASTY (CMC TENDON INTERPOSITIONAL ARTHROPLASTY);  Surgeon: Erin Sons, MD;  Location: [facility] ORS;  Service: Orthopedics;  Laterality: Right;   HAND SURGERY Left 04/2012   thumb   INCISION AND DRAINAGE HIP Right 06/17/2019   Procedure: right open gluteus medius repair, it band release, trochanteric bursectomy;  Surgeon: Signa Kell, MD;  Location: [facility] ORS;  Service: Orthopedics;  Laterality: Right;   laparotomy     exploratory r/t liver   LASER ABLATION Bilateral 09/2014   Dr Wyn Quaker   POLYPECTOMY  1994   TUBAL LIGATION  1976   VAGINAL HYSTERECTOMY  2021    MEDICATIONS:  sulfamethoxazole-trimethoprim (BACTRIM DS) 800-160 MG per tablet 1 tablet    AZO-CRANBERRY PO   calcium citrate-vitamin D (CITRACAL+D) 315-200 MG-UNIT tablet   clotrimazole-betamethasone (LOTRISONE) cream   docusate sodium (COLACE) 100 MG capsule   estradiol (ESTRACE) 0.1 MG/GM vaginal cream   losartan-hydrochlorothiazide (HYZAAR) 50-12.5 MG tablet   Multiple Vitamin (MULTIVITAMIN WITH MINERALS) TABS tablet   Probiotic Product (PROBIOTIC PO)   Propylene Glycol (SYSTANE COMPLETE OP)   solifenacin (VESICARE) 10 MG tablet   ibuprofen (ADVIL) 200 MG tablet    Shonna Chock, PA-C Surgical Short Stay/Anesthesiology [facility] Phone [phone] [facility] Phone [phone] 08/26/2021 3:06 PM

## 2021-08-26 NOTE — Anesthesia Preprocedure Evaluation (Addendum)
Anesthesia Evaluation  Patient identified by MRN, date of birth, ID band Patient awake    Reviewed: Allergy & Precautions, NPO status , Patient's Chart, lab work & pertinent test results  History of Anesthesia Complications (+) PONV and history of anesthetic complications  Airway Mallampati: II  TM Distance: >3 FB Neck ROM: Full    Dental  (+) Partial Lower, Partial Upper, Dental Advisory Given,    Pulmonary asthma ,    breath sounds clear to auscultation       Cardiovascular hypertension, Pt. on medications (-) angina(-) Past MI and (-) CHF  Rhythm:Regular     Neuro/Psych negative neurological ROS  negative psych ROS   GI/Hepatic negative GI ROS, H/o DIL injury     Endo/Other  diabetes  Renal/GU negative Renal ROS     Musculoskeletal  (+) Arthritis ,   Abdominal   Peds  Hematology negative hematology ROS (+)   Anesthesia Other Findings Patient is a 75 year old female scheduled for the above procedure.  History includes never smoker, postoperative N/V, HTN, DM2, right hip gluteus medius repair & trochanteric bursectomy (06/17/19), transaminitis/drug induced liver injury (07/2019, liver biopsy 08/18/19: moderate canalicular and biliary cholestasis, patchy mild perisinusoidal centrilobular fibrosis, overall histopathologic findings are compatible with, although not specific for, the clinical impression of the effects of medications/drugs. No definitive evidence of acute large duct obstruction is identified. No evidence of acute on chronic liver disease is identified. Negative for advanced fibrosis; culprit of DILI felt to be cefazolin), hysterectomy (with right salpingectomy, A/P colporrhaphy 03/02/20)  She had a Hepatic Function Panel on 08/20/21 at [facility]. Results showed total protein 6.5, albumin 4.3, total bilirubin 1.8, conjugated bilirubin 0.29, alkaline phosphatase 74, AST 22, ALT 23.  Previously total bilirubin had  been 2.2 07/10/21, 1.1 (with conjugated bili 0.21) 12/05/20, 1.3 (conjugated bili 0.24) 09/04/20, 1.7 (conjugated bilb 0.26) 07/31/20.   08/23/2021 presurgical COVID-19 test negative.   Reproductive/Obstetrics                            Anesthesia Physical Anesthesia Plan  ASA: 2  Anesthesia Plan: General   Post-op Pain Management:    Induction: Intravenous  PONV Risk Score and Plan: 4 or greater and Ondansetron, Dexamethasone, Propofol infusion and TIVA  Airway Management Planned: Oral ETT  Additional Equipment: None  Intra-op Plan:   Post-operative Plan:   Informed Consent: I have reviewed the patients History and Physical, chart, labs and discussed the procedure including the risks, benefits and alternatives for the proposed anesthesia with the patient or authorized representative who has indicated his/her understanding and acceptance.     Dental advisory given  Plan Discussed with: CRNA and Anesthesiologist  Anesthesia Plan Comments: (See PAT note written 08/26/2021 by Shonna Chock, PA-C. Had drug-induced liver injury after 06/2019 surgery at [facility]. Cefazolin felt to be the culprit agent.   )       Anesthesia Quick Evaluation

## 2021-08-26 NOTE — Progress Notes (Signed)
DUE TO COVID-19 ONLY ONE VISITOR IS ALLOWED TO COME WITH YOU AND STAY IN THE WAITING ROOM ONLY DURING PRE OP AND PROCEDURE DAY OF SURGERY.   Two VISITORS MAY VISIT WITH YOU AFTER SURGERY IN YOUR PRIVATE ROOM DURING VISITING HOURS ONLY!  Cardiologist - n/a Internal Med - Dr Dorothey Baseman [facility] - Dr Ardelle Lesches  Chest x-ray - n/a EKG - DOS 08/27/21 Stress Test - n/a ECHO - n/a Cardiac Cath - n/a  ICD Pacemaker/Loop - n/a  Sleep Study -  n/a CPAP - none  Diabetes - Diet controlled , no meds  If your blood sugar is less than 70 mg/dL, you will need to treat for low blood sugar: Treat a low blood sugar (less than 70 mg/dL) with  cup of clear juice (cranberry or apple), 4 glucose tablets, OR glucose gel. Recheck blood sugar in 15 minutes after treatment (to make sure it is greater than 70 mg/dL). If your blood sugar is not greater than 70 mg/dL on recheck, call [phone] for further instructions.  Anesthesia review: Yes  STOP now taking any Aspirin (unless otherwise instructed by your surgeon), Aleve, Naproxen, Ibuprofen, Motrin, Advil, Goody's, BC's, all herbal medications, fish oil, and all vitamins.   Coronavirus Screening Covid test on 08/23/21 was negative.   Do you have any of the following symptoms:  Cough yes/no: No Fever (>100.27F)  yes/no: No Runny nose yes/no: No Sore throat yes/no: No Difficulty breathing/shortness of breath  yes/no: No  Have you traveled in the last 14 days and where? yes/no: No  Patient verbalized understanding of instructions that were given via phone.

## 2021-08-27 ENCOUNTER — Inpatient Hospital Stay (HOSPITAL_COMMUNITY): Payer: Medicare Other

## 2021-08-27 ENCOUNTER — Observation Stay (HOSPITAL_COMMUNITY)
Admission: RE | Admit: 2021-08-27 | Discharge: 2021-08-28 | Disposition: A | Discharge status: Home / Self Care | Payer: Medicare Other | Attending: Neurosurgery | Admitting: Neurosurgery

## 2021-08-27 ENCOUNTER — Inpatient Hospital Stay (HOSPITAL_COMMUNITY): Payer: Medicare Other | Admitting: Vascular Surgery

## 2021-08-27 ENCOUNTER — Other Ambulatory Visit: Payer: Self-pay

## 2021-08-27 ENCOUNTER — Encounter (HOSPITAL_COMMUNITY): Payer: Self-pay | Admitting: Neurosurgery

## 2021-08-27 ENCOUNTER — Inpatient Hospital Stay (HOSPITAL_COMMUNITY)
Admission: RE | Disposition: A | Discharge status: Home / Self Care | Payer: Self-pay | Source: Home / Self Care | Attending: Neurosurgery

## 2021-08-27 DIAGNOSIS — I10 Essential (primary) hypertension: Secondary | ICD-10-CM | POA: Insufficient documentation

## 2021-08-27 DIAGNOSIS — E119 Type 2 diabetes mellitus without complications: Secondary | ICD-10-CM | POA: Diagnosis not present

## 2021-08-27 DIAGNOSIS — M4150 Other secondary scoliosis, site unspecified: Secondary | ICD-10-CM | POA: Diagnosis present

## 2021-08-27 DIAGNOSIS — J45909 Unspecified asthma, uncomplicated: Secondary | ICD-10-CM | POA: Insufficient documentation

## 2021-08-27 DIAGNOSIS — M4156 Other secondary scoliosis, lumbar region: Secondary | ICD-10-CM | POA: Insufficient documentation

## 2021-08-27 DIAGNOSIS — M4316 Spondylolisthesis, lumbar region: Secondary | ICD-10-CM | POA: Insufficient documentation

## 2021-08-27 DIAGNOSIS — Z79899 Other long term (current) drug therapy: Secondary | ICD-10-CM | POA: Insufficient documentation

## 2021-08-27 DIAGNOSIS — M48061 Spinal stenosis, lumbar region without neurogenic claudication: Secondary | ICD-10-CM | POA: Diagnosis present

## 2021-08-27 DIAGNOSIS — Z419 Encounter for procedure for purposes other than remedying health state, unspecified: Secondary | ICD-10-CM

## 2021-08-27 HISTORY — DX: Unspecified asthma, uncomplicated: J45.909

## 2021-08-27 LAB — CBC WITH DIFFERENTIAL/PLATELET
Abs Immature Granulocytes: 0.01 10*3/uL (ref 0.00–0.07)
Basophils Absolute: 0 10*3/uL (ref 0.0–0.1)
Basophils Relative: 1 %
Eosinophils Absolute: 0.1 10*3/uL (ref 0.0–0.5)
Eosinophils Relative: 1 %
HCT: 38 % (ref 36.0–46.0)
Hemoglobin: 13 g/dL (ref 12.0–15.0)
Immature Granulocytes: 0 %
Lymphocytes Relative: 20 %
Lymphs Abs: 1.1 10*3/uL (ref 0.7–4.0)
MCH: 32.7 pg (ref 26.0–34.0)
MCHC: 34.2 g/dL (ref 30.0–36.0)
MCV: 95.7 fL (ref 80.0–100.0)
Monocytes Absolute: 0.7 10*3/uL (ref 0.1–1.0)
Monocytes Relative: 12 %
Neutro Abs: 3.8 10*3/uL (ref 1.7–7.7)
Neutrophils Relative %: 66 %
Platelets: 171 10*3/uL (ref 150–400)
RBC: 3.97 MIL/uL (ref 3.87–5.11)
RDW: 12.5 % (ref 11.5–15.5)
WBC: 5.8 10*3/uL (ref 4.0–10.5)
nRBC: 0 % (ref 0.0–0.2)

## 2021-08-27 LAB — SURGICAL PCR SCREEN
MRSA, PCR: NEGATIVE
Staphylococcus aureus: NEGATIVE

## 2021-08-27 LAB — GLUCOSE, CAPILLARY
Glucose-Capillary: 106 mg/dL — ABNORMAL HIGH (ref 70–99)
Glucose-Capillary: 98 mg/dL (ref 70–99)
Glucose-Capillary: 169 mg/dL — ABNORMAL HIGH (ref 70–99)

## 2021-08-27 LAB — BASIC METABOLIC PANEL
Anion gap: 7 (ref 5–15)
BUN: 17 mg/dL (ref 8–23)
CO2: 27 mmol/L (ref 22–32)
Calcium: 9.8 mg/dL (ref 8.9–10.3)
Chloride: 102 mmol/L (ref 98–111)
Creatinine, Ser: 0.64 mg/dL (ref 0.44–1.00)
GFR, Estimated: >60 mL/min (ref >60)
Glucose, Bld: 124 mg/dL — ABNORMAL HIGH (ref 70–99)
Potassium: 3.8 mmol/L (ref 3.5–5.1)
Sodium: 136 mmol/L (ref 135–145)

## 2021-08-27 LAB — TYPE AND SCREEN
ABO/RH(D): A POS
Antibody Screen: NEGATIVE

## 2021-08-27 LAB — ABO/RH: ABO/RH(D): A POS

## 2021-08-27 SURGERY — POSTERIOR LUMBAR FUSION 2 LEVEL
Anesthesia: General | Site: Back

## 2021-08-27 MED ORDER — CHLORHEXIDINE GLUCONATE CLOTH 2 % EX PADS: 6.0000 | MEDICATED_PAD | Freq: Once | CUTANEOUS | Status: DC

## 2021-08-27 MED ORDER — LOSARTAN POTASSIUM 50 MG PO TABS: 50.0000 mg | ORAL_TABLET | Freq: Every day | ORAL | Status: DC

## 2021-08-27 MED ORDER — LOSARTAN POTASSIUM-HCTZ 50-12.5 MG PO TABS: 1.0000 | ORAL_TABLET | Freq: Every morning | ORAL | Status: DC

## 2021-08-27 MED ORDER — DOCUSATE SODIUM 100 MG PO CAPS
200.0000 mg | ORAL_CAPSULE | Freq: Every day | ORAL | Status: DC
  Administered 2021-08-27: 200 mg via ORAL
  Filled 2021-08-27: qty 2

## 2021-08-27 MED ORDER — PHENYLEPHRINE HCL (PRESSORS) 10 MG/ML IV SOLN
INTRAVENOUS | Status: AC
  Filled 2021-08-27: qty 1

## 2021-08-27 MED ORDER — PHENOL 1.4 % MT LIQD: 1.0000 | OROMUCOSAL | Status: DC | PRN

## 2021-08-27 MED ORDER — ACETAMINOPHEN 325 MG PO TABS: 650.0000 mg | ORAL_TABLET | ORAL | Status: DC | PRN

## 2021-08-27 MED ORDER — ONDANSETRON HCL 4 MG/2ML IJ SOLN: 4.0000 mg | Freq: Four times a day (QID) | INTRAMUSCULAR | Status: DC | PRN

## 2021-08-27 MED ORDER — BISACODYL 10 MG RE SUPP: 10.0000 mg | Freq: Every day | RECTAL | Status: DC | PRN

## 2021-08-27 MED ORDER — OXYCODONE HCL 5 MG PO TABS: 10.0000 mg | ORAL_TABLET | ORAL | Status: DC | PRN

## 2021-08-27 MED ORDER — SODIUM CHLORIDE 0.9% FLUSH: 3.0000 mL | INTRAVENOUS | Status: DC | PRN

## 2021-08-27 MED ORDER — ORAL CARE MOUTH RINSE: 15.0000 mL | Freq: Once | OROMUCOSAL | Status: AC

## 2021-08-27 MED ORDER — ADULT MULTIVITAMIN W/MINERALS CH
1.0000 | ORAL_TABLET | Freq: Every morning | ORAL | Status: DC
  Administered 2021-08-28: 1 via ORAL
  Filled 2021-08-27: qty 1

## 2021-08-27 MED ORDER — PROPYLENE GLYCOL 0.6 % OP SOLN: Freq: Two times a day (BID) | OPHTHALMIC | Status: DC | PRN

## 2021-08-27 MED ORDER — PROPOFOL 10 MG/ML IV BOLUS
INTRAVENOUS | Status: AC
  Filled 2021-08-27: qty 20

## 2021-08-27 MED ORDER — SUGAMMADEX SODIUM 200 MG/2ML IV SOLN
INTRAVENOUS | Status: DC | PRN
  Administered 2021-08-27 (×2): 100 mg via INTRAVENOUS

## 2021-08-27 MED ORDER — THROMBIN 20000 UNITS EX SOLR
CUTANEOUS | Status: AC
  Filled 2021-08-27: qty 20000

## 2021-08-27 MED ORDER — ONDANSETRON HCL 4 MG/2ML IJ SOLN
INTRAMUSCULAR | Status: DC | PRN
  Administered 2021-08-27: 4 mg via INTRAVENOUS

## 2021-08-27 MED ORDER — PHENYLEPHRINE HCL-NACL 20-0.9 MG/250ML-% IV SOLN
INTRAVENOUS | Status: DC | PRN
  Administered 2021-08-27: 20 ug/min via INTRAVENOUS

## 2021-08-27 MED ORDER — ACETAMINOPHEN 650 MG RE SUPP: 650.0000 mg | RECTAL | Status: DC | PRN

## 2021-08-27 MED ORDER — OXYCODONE HCL 5 MG PO TABS: 5.0000 mg | ORAL_TABLET | Freq: Once | ORAL | Status: DC | PRN

## 2021-08-27 MED ORDER — BUPIVACAINE HCL (PF) 0.25 % IJ SOLN
INTRAMUSCULAR | Status: AC
  Filled 2021-08-27: qty 30

## 2021-08-27 MED ORDER — ONDANSETRON HCL 4 MG/2ML IJ SOLN
INTRAMUSCULAR | Status: AC
  Filled 2021-08-27: qty 2

## 2021-08-27 MED ORDER — SODIUM CHLORIDE 0.9% FLUSH: 3.0000 mL | Freq: Two times a day (BID) | INTRAVENOUS | Status: DC

## 2021-08-27 MED ORDER — OYSTER SHELL CALCIUM/D3 500-5 MG-MCG PO TABS
1.0000 | ORAL_TABLET | Freq: Two times a day (BID) | ORAL | Status: DC
  Administered 2021-08-27 – 2021-08-28 (×2): 1 via ORAL
  Filled 2021-08-27 (×2): qty 1

## 2021-08-27 MED ORDER — HYDROMORPHONE HCL 1 MG/ML IJ SOLN: 1.0000 mg | INTRAMUSCULAR | Status: DC | PRN

## 2021-08-27 MED ORDER — VANCOMYCIN HCL IN DEXTROSE 1-5 GM/200ML-% IV SOLN
1000.0000 mg | INTRAVENOUS | Status: AC
  Administered 2021-08-27: 1000 mg via INTRAVENOUS
  Filled 2021-08-27: qty 200

## 2021-08-27 MED ORDER — VANCOMYCIN HCL 1000 MG IV SOLR
INTRAVENOUS | Status: AC
  Filled 2021-08-27: qty 20

## 2021-08-27 MED ORDER — OXYCODONE HCL 5 MG/5ML PO SOLN: 5.0000 mg | Freq: Once | ORAL | Status: DC | PRN

## 2021-08-27 MED ORDER — LIDOCAINE 2% (20 MG/ML) 5 ML SYRINGE
INTRAMUSCULAR | Status: AC
  Filled 2021-08-27: qty 5

## 2021-08-27 MED ORDER — CHLORHEXIDINE GLUCONATE 0.12 % MT SOLN
15.0000 mL | Freq: Once | OROMUCOSAL | Status: AC
  Administered 2021-08-27: 15 mL via OROMUCOSAL
  Filled 2021-08-27: qty 15

## 2021-08-27 MED ORDER — VANCOMYCIN HCL 1000 MG/200ML IV SOLN
1000.0000 mg | Freq: Once | INTRAVENOUS | Status: AC
  Administered 2021-08-27: 1000 mg via INTRAVENOUS
  Filled 2021-08-27: qty 200

## 2021-08-27 MED ORDER — ROCURONIUM BROMIDE 10 MG/ML (PF) SYRINGE
PREFILLED_SYRINGE | INTRAVENOUS | Status: DC | PRN
  Administered 2021-08-27: 60 mg via INTRAVENOUS
  Administered 2021-08-27: 40 mg via INTRAVENOUS

## 2021-08-27 MED ORDER — THROMBIN 20000 UNITS EX SOLR
CUTANEOUS | Status: DC | PRN
  Administered 2021-08-27 (×2): 20 mL via TOPICAL

## 2021-08-27 MED ORDER — POLYETHYLENE GLYCOL 3350 17 G PO PACK: 17.0000 g | PACK | Freq: Every day | ORAL | Status: DC | PRN

## 2021-08-27 MED ORDER — 0.9 % SODIUM CHLORIDE (POUR BTL) OPTIME
TOPICAL | Status: DC | PRN
  Administered 2021-08-27: 1000 mL

## 2021-08-27 MED ORDER — VANCOMYCIN HCL 1000 MG IV SOLR
INTRAVENOUS | Status: DC | PRN
  Administered 2021-08-27: 1000 mg

## 2021-08-27 MED ORDER — DEXAMETHASONE SODIUM PHOSPHATE 10 MG/ML IJ SOLN
10.0000 mg | Freq: Once | INTRAMUSCULAR | Status: AC
  Administered 2021-08-27: 10 mg via INTRAVENOUS
  Filled 2021-08-27: qty 1

## 2021-08-27 MED ORDER — LACTATED RINGERS IV SOLN: INTRAVENOUS | Status: DC

## 2021-08-27 MED ORDER — BUPIVACAINE HCL (PF) 0.25 % IJ SOLN
INTRAMUSCULAR | Status: DC | PRN
  Administered 2021-08-27: 20 mL

## 2021-08-27 MED ORDER — FENTANYL CITRATE (PF) 100 MCG/2ML IJ SOLN: 25.0000 ug | INTRAMUSCULAR | Status: DC | PRN

## 2021-08-27 MED ORDER — THROMBIN 5000 UNITS EX SOLR
CUTANEOUS | Status: AC
  Filled 2021-08-27: qty 5000

## 2021-08-27 MED ORDER — LIDOCAINE 2% (20 MG/ML) 5 ML SYRINGE
INTRAMUSCULAR | Status: DC | PRN
  Administered 2021-08-27: 60 mg via INTRAVENOUS

## 2021-08-27 MED ORDER — PROPOFOL 10 MG/ML IV BOLUS
INTRAVENOUS | Status: DC | PRN
  Administered 2021-08-27: 110 mg via INTRAVENOUS

## 2021-08-27 MED ORDER — FENTANYL CITRATE (PF) 250 MCG/5ML IJ SOLN
INTRAMUSCULAR | Status: DC | PRN
Start: 2021-08-27 — End: 2021-08-27
  Administered 2021-08-27: 100 ug via INTRAVENOUS
  Administered 2021-08-27: 50 ug via INTRAVENOUS

## 2021-08-27 MED ORDER — PROPOFOL 500 MG/50ML IV EMUL
INTRAVENOUS | Status: DC | PRN
  Administered 2021-08-27: 100 ug/kg/min via INTRAVENOUS

## 2021-08-27 MED ORDER — DARIFENACIN HYDROBROMIDE ER 15 MG PO TB24
15.0000 mg | ORAL_TABLET | Freq: Every day | ORAL | Status: DC
  Filled 2021-08-27: qty 1

## 2021-08-27 MED ORDER — FLEET ENEMA 7-19 GM/118ML RE ENEM: 1.0000 | ENEMA | Freq: Once | RECTAL | Status: DC | PRN

## 2021-08-27 MED ORDER — SODIUM CHLORIDE 0.9 % IV SOLN: 250.0000 mL | INTRAVENOUS | Status: DC

## 2021-08-27 MED ORDER — FENTANYL CITRATE (PF) 250 MCG/5ML IJ SOLN
INTRAMUSCULAR | Status: AC
  Filled 2021-08-27: qty 5

## 2021-08-27 MED ORDER — HYDROCHLOROTHIAZIDE 12.5 MG PO TABS: 12.5000 mg | ORAL_TABLET | Freq: Every day | ORAL | Status: DC

## 2021-08-27 MED ORDER — DIAZEPAM 5 MG PO TABS: 5.0000 mg | ORAL_TABLET | Freq: Four times a day (QID) | ORAL | Status: DC | PRN

## 2021-08-27 MED ORDER — ONDANSETRON HCL 4 MG PO TABS: 4.0000 mg | ORAL_TABLET | Freq: Four times a day (QID) | ORAL | Status: DC | PRN

## 2021-08-27 MED ORDER — ROCURONIUM BROMIDE 10 MG/ML (PF) SYRINGE
PREFILLED_SYRINGE | INTRAVENOUS | Status: AC
  Filled 2021-08-27: qty 10

## 2021-08-27 MED ORDER — HYDROCODONE-ACETAMINOPHEN 10-325 MG PO TABS
1.0000 | ORAL_TABLET | ORAL | Status: DC | PRN
  Administered 2021-08-27 – 2021-08-28 (×3): 1 via ORAL
  Filled 2021-08-27 (×3): qty 1

## 2021-08-27 MED ORDER — CEFAZOLIN SODIUM-DEXTROSE 1-4 GM/50ML-% IV SOLN: 1.0000 g | Freq: Three times a day (TID) | INTRAVENOUS | Status: DC

## 2021-08-27 MED ORDER — MENTHOL 3 MG MT LOZG: 1.0000 | LOZENGE | OROMUCOSAL | Status: DC | PRN

## 2021-08-27 MED ORDER — DEXAMETHASONE SODIUM PHOSPHATE 10 MG/ML IJ SOLN
INTRAMUSCULAR | Status: AC
  Filled 2021-08-27: qty 1

## 2021-08-27 SURGICAL SUPPLY — 73 items
BAG COUNTER SPONGE SURGICOUNT (BAG) ×4 IMPLANT
BAG DECANTER FOR FLEXI CONT (MISCELLANEOUS) ×3 IMPLANT
BAG SURGICOUNT SPONGE COUNTING (BAG) ×2
BASKET BONE COLLECTION (BASKET) ×3 IMPLANT
BENZOIN TINCTURE PRP APPL 2/3 (GAUZE/BANDAGES/DRESSINGS) ×3 IMPLANT
BLADE CLIPPER SURG (BLADE) IMPLANT
BONE GRAFTON DBF INJECT 6CC (Bone Implant) ×6 IMPLANT
BUR CUTTER 7.0 ROUND (BURR) IMPLANT
BUR MATCHSTICK NEURO 3.0 LAGG (BURR) ×3 IMPLANT
CAGE EXP CATALYFT 9 (Plate) ×12 IMPLANT
CANISTER SUCT 3000ML PPV (MISCELLANEOUS) ×3 IMPLANT
CAP LCK SPNE (Orthopedic Implant) ×6 IMPLANT
CAP LOCK SPINE RADIUS (Orthopedic Implant) ×6 IMPLANT
CAP LOCKING (Orthopedic Implant) ×12 IMPLANT
CARTRIDGE OIL MAESTRO DRILL (MISCELLANEOUS) ×1 IMPLANT
CLOSURE WOUND 1/2 X4 (GAUZE/BANDAGES/DRESSINGS) ×2
CNTNR URN SCR LID CUP LEK RST (MISCELLANEOUS) ×1 IMPLANT
CONT SPEC 4OZ STRL OR WHT (MISCELLANEOUS) ×2
COVER BACK TABLE 60X90IN (DRAPES) ×3 IMPLANT
DECANTER SPIKE VIAL GLASS SM (MISCELLANEOUS) ×3 IMPLANT
DERMABOND ADVANCED (GAUZE/BANDAGES/DRESSINGS) ×2
DERMABOND ADVANCED .7 DNX12 (GAUZE/BANDAGES/DRESSINGS) ×1 IMPLANT
DIFFUSER DRILL AIR PNEUMATIC (MISCELLANEOUS) ×3 IMPLANT
DRAPE C-ARM 42X72 X-RAY (DRAPES) ×6 IMPLANT
DRAPE HALF SHEET 40X57 (DRAPES) IMPLANT
DRAPE LAPAROTOMY 100X72X124 (DRAPES) ×3 IMPLANT
DRAPE SURG 17X23 STRL (DRAPES) IMPLANT
DRSG OPSITE POSTOP 4X6 (GAUZE/BANDAGES/DRESSINGS) ×3 IMPLANT
DURAPREP 26ML APPLICATOR (WOUND CARE) ×3 IMPLANT
ELECT REM PT RETURN 9FT ADLT (ELECTROSURGICAL) ×3
ELECTRODE REM PT RTRN 9FT ADLT (ELECTROSURGICAL) ×1 IMPLANT
EVACUATOR 1/8 PVC DRAIN (DRAIN) IMPLANT
GAUZE 4X4 16PLY ~~LOC~~+RFID DBL (SPONGE) ×3 IMPLANT
GAUZE SPONGE 4X4 12PLY STRL (GAUZE/BANDAGES/DRESSINGS) IMPLANT
GLOVE SRG 8 PF TXTR STRL LF DI (GLOVE) ×1 IMPLANT
GLOVE SURG ENC MOIS LTX SZ6.5 (GLOVE) ×3 IMPLANT
GLOVE SURG LTX SZ7 (GLOVE) ×3 IMPLANT
GLOVE SURG LTX SZ7.5 (GLOVE) ×3 IMPLANT
GLOVE SURG LTX SZ9 (GLOVE) ×6 IMPLANT
GLOVE SURG POLYISO LF SZ7 (GLOVE) ×3 IMPLANT
GLOVE SURG UNDER POLY LF SZ6.5 (GLOVE) ×3 IMPLANT
GLOVE SURG UNDER POLY LF SZ7 (GLOVE) ×3 IMPLANT
GLOVE SURG UNDER POLY LF SZ7.5 (GLOVE) ×15 IMPLANT
GLOVE SURG UNDER POLY LF SZ8 (GLOVE) ×2
GOWN STRL REUS W/ TWL LRG LVL3 (GOWN DISPOSABLE) ×3 IMPLANT
GOWN STRL REUS W/ TWL XL LVL3 (GOWN DISPOSABLE) ×4 IMPLANT
GOWN STRL REUS W/TWL 2XL LVL3 (GOWN DISPOSABLE) IMPLANT
GOWN STRL REUS W/TWL LRG LVL3 (GOWN DISPOSABLE) ×6
GOWN STRL REUS W/TWL XL LVL3 (GOWN DISPOSABLE) ×8
KIT BASIN OR (CUSTOM PROCEDURE TRAY) ×3 IMPLANT
KIT TURNOVER KIT B (KITS) ×3 IMPLANT
MILL MEDIUM DISP (BLADE) ×3 IMPLANT
NEEDLE HYPO 22GX1.5 SAFETY (NEEDLE) ×3 IMPLANT
NS IRRIG 1000ML POUR BTL (IV SOLUTION) ×3 IMPLANT
OIL CARTRIDGE MAESTRO DRILL (MISCELLANEOUS) ×3
PACK LAMINECTOMY NEURO (CUSTOM PROCEDURE TRAY) ×3 IMPLANT
ROD 5.5X60MM GREEN (Rod) ×3 IMPLANT
ROD 70MM (Rod) ×2 IMPLANT
ROD SPNL 70X5.5XNS TI RDS (Rod) ×1 IMPLANT
SCREW 5.75X40M (Screw) ×6 IMPLANT
SCREW 5.75X45MM (Screw) ×12 IMPLANT
SPONGE SURGIFOAM ABS GEL 100 (HEMOSTASIS) ×6 IMPLANT
SPONGE T-LAP 4X18 ~~LOC~~+RFID (SPONGE) ×6 IMPLANT
STRIP CLOSURE SKIN 1/2X4 (GAUZE/BANDAGES/DRESSINGS) ×4 IMPLANT
SUT VIC AB 0 CT1 18XCR BRD8 (SUTURE) ×2 IMPLANT
SUT VIC AB 0 CT1 8-18 (SUTURE) ×4
SUT VIC AB 2-0 CT1 18 (SUTURE) ×3 IMPLANT
SUT VIC AB 3-0 SH 8-18 (SUTURE) ×6 IMPLANT
TOWEL GREEN STERILE (TOWEL DISPOSABLE) ×3 IMPLANT
TOWEL GREEN STERILE FF (TOWEL DISPOSABLE) ×3 IMPLANT
TRAY FOLEY MTR SLVR 14FR STAT (SET/KITS/TRAYS/PACK) ×3 IMPLANT
TRAY FOLEY MTR SLVR 16FR STAT (SET/KITS/TRAYS/PACK) IMPLANT
WATER STERILE IRR 1000ML POUR (IV SOLUTION) ×3 IMPLANT

## 2021-08-27 NOTE — Anesthesia Procedure Notes (Signed)
Procedure Name: Intubation Date/Time: 08/27/2021 10:50 AM Performed by: Trinna Post., CRNA Pre-anesthesia Checklist: Patient identified, Emergency Drugs available, Suction available, Patient being monitored and Timeout performed Patient Re-evaluated:Patient Re-evaluated prior to induction Oxygen Delivery Method: Circle system utilized Preoxygenation: Pre-oxygenation with 100% oxygen Induction Type: IV induction Ventilation: Mask ventilation without difficulty Laryngoscope Size: Mac and 3 Grade View: Grade I Tube type: Oral Tube size: 7.0 mm Number of attempts: 1 Airway Equipment and Method: Stylet Placement Confirmation: ETT inserted through vocal cords under direct vision, positive ETCO2 and breath sounds checked- equal and bilateral Secured at: 22 cm Tube secured with: Tape Dental Injury: Teeth and Oropharynx as per pre-operative assessment

## 2021-08-27 NOTE — Op Note (Signed)
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  L3-4, L4-5 ponte osteotomies for sagittal and coronal plane reduction  L3-4, L4-5 posterior lumbar interbody fusion utilizing interbody cages, local harvested autograft, and allograft  L3-4-5 posterior lateral arthrodesis utilizing segmental pedicle screw fixation and local autograft  Surgeon:Deedra Pro A.Diani Jillson, M.D.  Asst. Surgeon: Conchita Paris, MD; Doran Durand, NP  Anesthesia: General  Indication: 75 year old female with severe back and left lower extremity radicular pain failing all conservative management her work-up demonstrates evidence of marked disc degeneration with degenerative collapse, angulation and lateral listhesis at L3-4 and L4-5 with resultant loss of lordosis and significant coronal plane abnormality.  The patient has failed conservative management presents now for lumbar decompression and fusion over the 2 most affected segments.  Operative note: After induction anesthesia, patient edition prone on the Wilson frame appropriate padded.  Lumbar region prepped and draped sterilely.  Incision made overlying L3-4-5.  Dissection performed bilaterally.  Retractor placed.  Fluoroscopy used.  Levels confirmed.  Decompressive laminotomies and facetectomies were then performed bilaterally using Leksell rongeurs, Kerrison rongeurs and high-speed drill to remove the inferior two thirds of the lamina of L3 and L4 the entire inferior facet and pars interarticularis of L3 and L4 bilaterally and the superior facet of L4 and L5 bilaterally.  Lateral facetectomies were  completed to completely mobilize the affected levels and thus complete ponte osteotomies.  This enabled better sagittal and coronal plane reduction.  Bilateral discectomy was then performed at L3-4 and L4-5.  Distractors were placed into the interspace and the deformity was reduced.  With distractors placed patient's contralateral side to space was further cleaned on the left side at L3 and L4.  And L4-5.  Medtronic expandable lumbar cages were then packed into place and expanded at both L3-4 and L4-5 on the left side.  Distractor removed patient's right side.  The space then prepared on the right side.  Soft tissue removed interspace.  Morselized autograft packed in interspace.  A cage was then placed into L3-4 and L4-5 from the right side.  Each cage was then expanded.  Good reduction had been achieved.  Pedicles of L3-L4 and L5 were then identified using surface landmarks and intraoperative fluoroscopy of superficial bone around the pedicle was then removed and high-speed drill each pedicle was then probed using a pedicle all each pedicle tract was then probed and found to be solidly within the bone.  Each pedicle tract was then tapped with a screw tap.  Screw hole was probed and found to be solidly within the bone.  5.75 mm radius brand screws from [employer] placed bilaterally at L3-L4 and L5.  Final images reveal good position of cages and the hardware at the proper upper level with marked improvement of patient's alignment.  Short segment titanium rods and placed over the screw heads at L3-L4 and L5.  Locking caps placed through the screws and locking caps and engaged with a construct under compression on the left side.  Transverse processes of L3-4-5 were decorticated.  Morselized autograft was packed posterior laterally for later fusion.  Graft on putty was then injected into the cages bilaterally at L3-4 and L4-5.  Gelfoam was placed over the  laminotomy defects.  Vancomycin powder placed in deep wound  space.  Wounds and closed in layers of Vicryl sutures.  Steri-Strips and sterile dressing were applied.  No apparent complications.  Patient tolerated the procedure well and she returns to the recovery room postop.

## 2021-08-27 NOTE — H&P (Signed)
Chelsey [name] is an 75 y.o. female.   Chief Complaint: Back pain and left leg pain HPI: 75 year old female with progressively worsening left lower extremity pain.  Pain is very consistent with a left-sided L5 radicular pattern.  Pain associated with some numbness and intermittent weakness.  Work-up demonstrates evidence of severe disc degeneration with disc space collapse and lateral listhesis with degenerative scoliosis at L3-4 and L4-5 with marked lateral recess and foraminal stenosis at both levels worse at L4-5.  Patient presents now for two-level lumbar decompression and fusion in hopes of improving her symptoms.  Past Medical History:  Diagnosis Date   Arthritis    Asthma    Colon polyp 1994   Complication of anesthesia    nausea   Cystitis    Diabetes mellitus without complication (HCC)    no meds   Diffuse cystic mastopathy    Elevated liver enzymes 08/2021   Hypertension    PONV (postoperative nausea and vomiting)    in distant past    Past Surgical History:  Procedure Laterality Date   bladder prolapse surgery     x 2 - 2021, 2022   BREAST BIOPSY Bilateral    neg   BREAST CYST ASPIRATION Right    neg   BREAST LUMPECTOMY Right 1976   BREAST SURGERY Left 2002   excision   COLONOSCOPY  2011, 09/2013   COLONOSCOPY WITH PROPOFOL N/A 10/29/2018   Procedure: COLONOSCOPY WITH PROPOFOL;  Surgeon: Scot Jun, MD;  Location: [facility] ENDOSCOPY;  Service: Endoscopy;  Laterality: N/A;   FINGER ARTHROPLASTY Right 03/24/2018   Procedure: FINGER ARTHROPLASTY (CMC TENDON INTERPOSITIONAL ARTHROPLASTY);  Surgeon: Erin Sons, MD;  Location: [facility] ORS;  Service: Orthopedics;  Laterality: Right;   HAND SURGERY Left 04/2012   thumb   INCISION AND DRAINAGE HIP Right 06/17/2019   Procedure: right open gluteus medius repair, it band release, trochanteric bursectomy;  Surgeon: Signa Kell, MD;  Location: [facility] ORS;  Service: Orthopedics;  Laterality: Right;   laparotomy      exploratory r/t liver   LASER ABLATION Bilateral 09/2014   Dr Wyn Quaker   POLYPECTOMY  1994   TUBAL LIGATION  1976   VAGINAL HYSTERECTOMY  2021    Family History  Problem Relation Age of Onset   Cancer Father        lung   Cancer Mother        colon   Cancer Sister        lung   Pulmonary fibrosis Brother    Pulmonary fibrosis Sister    Breast cancer Neg Hx    Social History:  reports that she has never smoked. She has never used smokeless tobacco. She reports that she does not drink alcohol and does not use drugs.  Allergies:  Allergies  Allergen Reactions   Cefazolin Other (See Comments)    Too much bilirubin built up in liver (liver turned yellow)    Facility-Administered Medications Prior to Admission  Medication Dose Route Frequency Provider Last Rate Last Admin   sulfamethoxazole-trimethoprim (BACTRIM DS) 800-160 MG per tablet 1 tablet  1 tablet Oral Q12H Vanna Scotland, MD   1 tablet at 03/26/21 1538   Medications Prior to Admission  Medication Sig Dispense Refill   AZO-CRANBERRY PO Take 1 tablet by mouth in the morning and at bedtime.     calcium citrate-vitamin D (CITRACAL+D) 315-200 MG-UNIT tablet Take 1 tablet by mouth in the morning and at bedtime.     clotrimazole-betamethasone (LOTRISONE) cream  APPLY  CREAM TOPICALLY TWICE DAILY 45 g 0   docusate sodium (COLACE) 100 MG capsule Take 200 mg by mouth at bedtime.     estradiol (ESTRACE) 0.1 MG/GM vaginal cream Place 1 Applicatorful vaginally 2 (two) times a week. Mondays & Fridays.     ibuprofen (ADVIL) 200 MG tablet Take 800 mg by mouth every 8 (eight) hours as needed (pain.).     losartan-hydrochlorothiazide (HYZAAR) 50-12.5 MG tablet Take 1 tablet by mouth in the morning.     Multiple Vitamin (MULTIVITAMIN WITH MINERALS) TABS tablet Take 1 tablet by mouth in the morning.     Probiotic Product (PROBIOTIC PO) Take 1 capsule by mouth in the morning and at bedtime.     Propylene Glycol (SYSTANE COMPLETE OP) Place 1  drop into both eyes 2 (two) times daily as needed (dry/irritated eyes.).     solifenacin (VESICARE) 10 MG tablet Take 10 mg by mouth in the morning.      Results for orders placed or performed during the hospital encounter of 08/27/21 (from the past 48 hour(s))  CBC WITH DIFFERENTIAL     Status: None   Collection Time: 08/27/21  8:14 AM  Result Value Ref Range   WBC 5.8 4.0 - 10.5 K/uL   RBC 3.97 3.87 - 5.11 MIL/uL   Hemoglobin 13.0 12.0 - 15.0 g/dL   HCT 25.9 56.3 - 87.5 %   MCV 95.7 80.0 - 100.0 fL   MCH 32.7 26.0 - 34.0 pg   MCHC 34.2 30.0 - 36.0 g/dL   RDW 64.3 32.9 - 51.8 %   Platelets 171 150 - 400 K/uL   nRBC 0.0 0.0 - 0.2 %   Neutrophils Relative % 66 %   Neutro Abs 3.8 1.7 - 7.7 K/uL   Lymphocytes Relative 20 %   Lymphs Abs 1.1 0.7 - 4.0 K/uL   Monocytes Relative 12 %   Monocytes Absolute 0.7 0.1 - 1.0 K/uL   Eosinophils Relative 1 %   Eosinophils Absolute 0.1 0.0 - 0.5 K/uL   Basophils Relative 1 %   Basophils Absolute 0.0 0.0 - 0.1 K/uL   Immature Granulocytes 0 %   Abs Immature Granulocytes 0.01 0.00 - 0.07 K/uL    Comment: Performed at [facility], [address], [place], [place] [address]  Basic metabolic panel per protocol     Status: Abnormal   Collection Time: 08/27/21  8:14 AM  Result Value Ref Range   Sodium 136 135 - 145 mmol/L   Potassium 3.8 3.5 - 5.1 mmol/L   Chloride 102 98 - 111 mmol/L   CO2 27 22 - 32 mmol/L   Glucose, Bld 124 (H) 70 - 99 mg/dL    Comment: Glucose reference range applies only to samples taken after fasting for at least 8 hours.   BUN 17 8 - 23 mg/dL   Creatinine, Ser 0.63 0.44 - 1.00 mg/dL   Calcium 9.8 8.9 - 01.6 mg/dL   GFR, Estimated >01 >09 mL/min    Comment: (NOTE) Calculated using the CKD-EPI Creatinine Equation (2021)    Anion gap 7 5 - 15    Comment: Performed at [facility], [address], [place], [place] [address]  Glucose, capillary     Status: Abnormal   Collection Time: 08/27/21  8:19 AM   Result Value Ref Range   Glucose-Capillary 106 (H) 70 - 99 mg/dL    Comment: Glucose reference range applies only to samples taken after fasting for  at least 8 hours.  Type and screen     Status: None   Collection Time: 08/27/21  8:30 AM  Result Value Ref Range   ABO/RH(D) A POS    Antibody Screen NEG    Sample Expiration      08/30/2021,2359 Performed at [facility], [address] [place], [place] [address]   ABO/Rh     Status: None   Collection Time: 08/27/21  8:35 AM  Result Value Ref Range   ABO/RH(D)      A POS Performed at [facility], [address] [place], [place] [address]    No results found.  Pertinent items noted in HPI and remainder of comprehensive ROS otherwise negative.  Blood pressure (!) 196/77, pulse 62, temperature 97.6 F (36.4 C), temperature source Oral, resp. rate 18, height 5\' 1"  (1.549 m), weight 62.8 kg, SpO2 99 %.  Patient is awake and alert.  She is oriented and appropriate.  Speech is fluent.  Judgment insight are intact.  Cranial nerve function normal bilateral.  Motor examination reveals some mild weakness of her left-sided anterior tibialis and left-sided extensor houses longus otherwise motor strength intact.  Sensory examination with decrease sensation pinprick light touch in her left L4 and L5 dermatomes.  Deep tendon versus normal active scepter Achilles reflexes are absent bilaterally.  Gait is antalgic.  Posture is with a mild rightward tilt and some forward flexion.  Examination head ears eyes nose and throat is unremarked.  Chest and abdomen are benign.  Extremities are free from injury or deformity. 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  Plan bilateral L3-4 and L4-5 decompressive laminotomies and foraminotomies followed by posterior lumbar interbody fusion utilizing interbody cages, local harvested  autograft, and augmented with posterior lateral thesis utilizing segmental pedicle screw fixation local autografting.  Risks and benefits been explained.  Patient wishes to proceed.  A Horald Birky 08/27/2021, 10:17 AM

## 2021-08-27 NOTE — Progress Notes (Signed)
Dr. Maple Hudson made aware of patient's BP readings this am. 0815- 205/74, 0037- 205/85 and 0488- 196/77. No new orders received at this time.

## 2021-08-27 NOTE — Progress Notes (Signed)
Orthopedic Tech Progress Note Patient Details:  Chelsey [name] 06/03/1946 [id number]  RN stated " patient has back brace "  Patient ID: Chelsey [name], female   DOB: October 27, 1945, 75 y.o.   MRN: [id number]  Donald Pore 08/27/2021, 3:44 PM

## 2021-08-27 NOTE — Transfer of Care (Signed)
Immediate Anesthesia Transfer of Care Note  Patient: Chelsey [name]  Procedure(s) Performed: Posterior Lumbar Interbody Fusion  - Lumbar three-Lumbar four - Lumbar four-Lumbar five (Back)  Patient Location: PACU  Anesthesia Type:General  Level of Consciousness: awake, alert  and drowsy  Airway & Oxygen Therapy: Patient Spontanous Breathing  Post-op Assessment: Report given to RN and Post -op Vital signs reviewed and stable  Post vital signs: Reviewed and stable  Last Vitals:  Vitals Value Taken Time  BP 129/67 08/27/21 1427  Temp    Pulse    Resp 24 08/27/21 1429  SpO2    Vitals shown include unvalidated device data.  Last Pain:  Vitals:   08/27/21 0838  TempSrc:   PainSc: 7       Patients Stated Pain Goal: 4 (08/27/21 1624)  Complications: No notable events documented.

## 2021-08-27 NOTE — Brief Op Note (Signed)
08/27/2021  2:14 PM  PATIENT:  Chelsey [name]  75 y.o. female  PRE-OPERATIVE DIAGNOSIS:  Spondylolisthesis  POST-OPERATIVE DIAGNOSIS:  Spondylolisthesis  PROCEDURE:  Procedure(s): Posterior Lumbar Interbody Fusion  - Lumbar three-Lumbar four - Lumbar four-Lumbar five (N/A)  SURGEON:  Surgeon(s) and Role:    * Julio Sicks, MD - Primary    * Lisbeth Renshaw, MD - Assisting  PHYSICIAN ASSISTANT:   ASSISTANTSMarland Mcalpine   ANESTHESIA:   general  EBL:  300 mL   BLOOD ADMINISTERED:none  DRAINS: none   LOCAL MEDICATIONS USED:  MARCAINE     SPECIMEN:  No Specimen  DISPOSITION OF SPECIMEN:  N/A  COUNTS:  YES  TOURNIQUET:  * No tourniquets in log *  DICTATION: .Dragon Dictation  PLAN OF CARE: Admit to inpatient   PATIENT DISPOSITION:  PACU - hemodynamically stable.   Delay start of Pharmacological VTE agent (>24hrs) due to surgical blood loss or risk of bleeding: yes

## 2021-08-28 DIAGNOSIS — M48061 Spinal stenosis, lumbar region without neurogenic claudication: Secondary | ICD-10-CM | POA: Diagnosis not present

## 2021-08-28 LAB — GLUCOSE, CAPILLARY: Glucose-Capillary: 118 mg/dL — ABNORMAL HIGH (ref 70–99)

## 2021-08-28 MED ORDER — METHOCARBAMOL 500 MG PO TABS: 500.0000 mg | ORAL_TABLET | Freq: Four times a day (QID) | ORAL | 0 refills | Status: DC

## 2021-08-28 MED ORDER — OXYCODONE HCL 10 MG PO TABS: 10.0000 mg | ORAL_TABLET | ORAL | 0 refills | Status: DC | PRN

## 2021-08-28 MED FILL — Thrombin For Soln 20000 Unit: CUTANEOUS | Qty: 1 | Status: AC

## 2021-08-28 NOTE — Evaluation (Signed)
Occupational Therapy Evaluation/Discharge Patient Details Name: Chelsey [name] MRN: [id number] DOB: [birth year] Today's Date: 08/28/2021   History of Present Illness Pt is a 75 y/o female with progressive back and left leg pain. Imaging showed disc degeneration with disc space collapse, lateral listhesis and scoliosis at L3-4, L4-5 and spinal stenosis. Pt elected to undergo L3-4, L4-5 decompression and fusion on 11/16. PMH: DM, HTN, arthritis.   Clinical Impression   PTA, pt lives with spouse and reports Independence with all daily tasks without use of AD. Pt presents now s/p procedure above with minor deficits in dynamic balance and pain. Educated on spinal precautions for ADLs with pt able to demo UB ADLs with Setup Assist (including brace mgmt) and Min A for LB ADLs due to difficulty reaching B feet while maintaining precautions. Husband present and able to assist pt as needed for ADLs with plans for pt's daughter to stay with pt for a few days after discharge. Educated on strategies for IADL completion as needed at home.  Encouraged use of BSC over toilet to ease transfers and/or as shower chair during bathing tasks. Pt/husband verbalized understanding of all education with no further skilled OT services needed at acute level. OT to sign off.     Recommendations for follow up therapy are one component of a multi-disciplinary discharge planning process, led by the attending physician.  Recommendations may be updated based on patient status, additional functional criteria and insurance authorization.   Follow Up Recommendations  No OT follow up    Assistance Recommended at Discharge Intermittent Supervision/Assistance  Functional Status Assessment  Patient has had a recent decline in their functional status and demonstrates the ability to make significant improvements in function in a reasonable and predictable amount of time.  Equipment Recommendations  None recommended by OT     Recommendations for Other Services       Precautions / Restrictions Precautions Precautions: Fall;Back Precaution Booklet Issued: Yes (comment) Required Braces or Orthoses: Spinal Brace Spinal Brace: Lumbar corset Restrictions Weight Bearing Restrictions: No      Mobility Bed Mobility Overal bed mobility: Modified Independent             General bed mobility comments: log rolling to get EOB, light use of bed rails    Transfers Overall transfer level: Modified independent Equipment used: None               General transfer comment: increased time to rise      Balance Overall balance assessment: Mild deficits observed, not formally tested                                         ADL either performed or assessed with clinical judgement   ADL Overall ADL's : Needs assistance/impaired Eating/Feeding: Independent   Grooming: Independent;Standing   Upper Body Bathing: Set up;Sitting   Lower Body Bathing: Minimal assistance;Sit to/from stand   Upper Body Dressing : Set up;Sitting Upper Body Dressing Details (indicate cue type and reason): able to return demo brace mgmt after education, manage shirt and bra without assist Lower Body Dressing: Minimal assistance;Sit to/from stand Lower Body Dressing Details (indicate cue type and reason): unable to reach B feet to don socks around toes, assist to get feet into pants legs. Husband able to assist with socks as needed Toilet Transfer: Supervision/safety;Ambulation;Regular Toilet;Grab bars Toilet Transfer Details (indicate cue type and reason): slow,  cautious pace reaching out for support but no overt LOB. use of grab bars to stand from std toilet Toileting- Clothing Manipulation and Hygiene: Independent;Sit to/from stand       Functional mobility during ADLs: Supervision/safety General ADL Comments: pt with difficulty reaching B feet while maintaining back precautions - husband present and  assisting as needed. noted slow pace with gait - encouraged trial of RW vs Rollator to increase confidence with mobility (PT aware). Educated and collaborated on use of BSC as shower chair and over toilet to raise seat height     Vision Baseline Vision/History: 1 Wears glasses Ability to See in Adequate Light: 0 Adequate Patient Visual Report: No change from baseline;Other (comment) (hx of double vision) Vision Assessment?: Vision impaired- to be further tested in functional context Additional Comments: hx of double vision in L eye (since she was in her 40s per pt); glasses ease symptoms     Perception     Praxis      Pertinent Vitals/Pain Pain Assessment: Faces Faces Pain Scale: Hurts a little bit Pain Location: low back Pain Descriptors / Indicators: Operative site guarding;Sore     Hand Dominance Right   Extremity/Trunk Assessment Upper Extremity Assessment Upper Extremity Assessment: Overall WFL for tasks assessed   Lower Extremity Assessment Lower Extremity Assessment: Defer to PT evaluation   Cervical / Trunk Assessment Cervical / Trunk Assessment: Back Surgery   Communication Communication Communication: No difficulties   Cognition Arousal/Alertness: Awake/alert Behavior During Therapy: WFL for tasks assessed/performed Overall Cognitive Status: Within Functional Limits for tasks assessed                                       General Comments  Husband present and supportive    Exercises     Shoulder Instructions      Home Living Family/patient expects to be discharged to:: Private residence Living Arrangements: Spouse/significant other Available Help at Discharge: Family;Available 24 hours/day (daughter will be staying with pt for a few days after husband goes back to work) Type of Home: House Home Access: Stairs to enter [employer] of Steps: 6 at front, 4 at back through [employer]: Right Home Layout: One  level     Bathroom Shower/Tub: Chief Strategy Officer: Standard     Home Equipment: Retail banker (4 wheels);BSC/3in1   Additional Comments: DME from husband's mother and borrowed Rollator      Prior Functioning/Environment Prior Level of Function : Independent/Modified Independent;Driving             Mobility Comments: no use of AD for mobility ADLs Comments: able to complete ADLs, IADLs. drives and grocery shops        OT Problem List:        OT Treatment/Interventions:      OT Goals(Current goals can be found in the care plan section) Acute Rehab OT Goals Patient Stated Goal: go home today OT Goal Formulation: All assessment and education complete, DC therapy  OT Frequency:     Barriers to D/C:            Co-evaluation              AM-PAC OT "6 Clicks" Daily Activity     Outcome Measure Help from another person eating meals?: None Help from another person taking care of personal grooming?: None Help from another person toileting, which includes using  toliet, bedpan, or urinal?: None Help from another person bathing (including washing, rinsing, drying)?: A Little Help from another person to put on and taking off regular upper body clothing?: A Little Help from another person to put on and taking off regular lower body clothing?: A Little 6 Click Score: 21   End of Session Equipment Utilized During Treatment: Back brace Nurse Communication: Mobility status  Activity Tolerance: Patient tolerated treatment well Patient left: in bed;with call bell/phone within reach;with family/visitor present  OT Visit Diagnosis: Other abnormalities of gait and mobility (R26.89)                Time: 8937-3428 OT Time Calculation (min): 25 min Charges:  OT General Charges $OT Visit: 1 Visit OT Evaluation $OT Eval Low Complexity: 1 Low OT Treatments $Self Care/Home Management : 8-22 mins  Bradd Canary, OTR/L Acute Rehab Services Office:  [phone]   Lorre Munroe 08/28/2021, 7:22 AM

## 2021-08-28 NOTE — Plan of Care (Signed)
Patient alert and oriented, mae's well, voiding adequate amount of urine, swallowing without difficulty, no c/o pain at time of discharge. Patient discharged home with family. Script and discharged instructions given to patient. Patient and family stated understanding of instructions given. Patient has an appointment with Dr. Pool in 2 weeks 

## 2021-08-28 NOTE — Evaluation (Signed)
Physical Therapy Evaluation Patient Details Name: Chelsey [name] MRN: [id number] DOB: March 20, 1946 Today's Date: 08/28/2021  History of Present Illness  Pt is a 75 y/o female with progressive back and left leg pain. Imaging showed disc degeneration with disc space collapse, lateral listhesis and scoliosis at L3-4, L4-5 and spinal stenosis. Pt elected to undergo L3-4, L4-5 decompression and fusion on 11/16. PMH: DM, HTN, arthritis.   Clinical Impression  Pt admitted with above diagnosis. At the time of PT eval, pt was able to demonstrate transfers and ambulation with gross supervision for safety and RW for support. Pt was educated on precautions, brace application/wearing schedule, appropriate activity progression, and car transfer. Pt currently with functional limitations due to the deficits listed below (see PT Problem List). Pt will benefit from skilled PT to increase their independence and safety with mobility to allow discharge to the venue listed below.         Recommendations for follow up therapy are one component of a multi-disciplinary discharge planning process, led by the attending physician.  Recommendations may be updated based on patient status, additional functional criteria and insurance authorization.  Follow Up Recommendations No PT follow up    Assistance Recommended at Discharge Intermittent Supervision/Assistance  Functional Status Assessment Patient has had a recent decline in their functional status and demonstrates the ability to make significant improvements in function in a reasonable and predictable amount of time.  Equipment Recommendations  None recommended by PT    Recommendations for Other Services       Precautions / Restrictions Precautions Precautions: Fall;Back Precaution Booklet Issued: Yes (comment) Required Braces or Orthoses: Spinal Brace Spinal Brace: Lumbar corset Restrictions Weight Bearing Restrictions: No      Mobility  Bed  Mobility Overal bed mobility: Modified Independent             General bed mobility comments: HOB slightly elevated, rails lowered to simulate home environment. No assist required however increased time provided.    Transfers Overall transfer level: Needs assistance Equipment used: None Transfers: Sit to/from Stand Sit to Stand: Supervision           General transfer comment: Pt demonstrated proper hand placement on seated surface for safety when standing, however required cues to reach back for the bed when initiating stand>sit.    Ambulation/Gait Ambulation/Gait assistance: Supervision Gait Distance (Feet): 500 Feet Assistive device: Rolling walker (2 wheels);None Gait Pattern/deviations: Step-through pattern;Decreased stride length;Trunk flexed Gait velocity: Decreased Gait velocity interpretation: <1.31 ft/sec, indicative of household ambulator   General Gait Details: VC's for improved posture, closer walker proximity and forward gaze. Significantly antalgic without an AD however improved greatly with the RW.  Stairs Stairs: Yes Stairs assistance: Supervision Stair Management: One rail Right;Step to pattern;Forwards Number of Stairs: 6 General stair comments: VC's for sequencing and general safety. No assist required.  Wheelchair Mobility    Modified Rankin (Stroke Patients Only)       Balance Overall balance assessment: Mild deficits observed, not formally tested                                           Pertinent Vitals/Pain Pain Assessment: Faces Faces Pain Scale: Hurts a little bit Pain Location: low back Pain Descriptors / Indicators: Operative site guarding;Sore    Home Living Family/patient expects to be discharged to:: Private residence Living Arrangements: Spouse/significant other Available Help at Discharge:  Family;Available 24 hours/day (daughter will be staying with pt for a few days after husband goes back to work) Type  of Home: House Home Access: Stairs to enter Entrance Stairs-Rails: Right Entrance Stairs-Number of Steps: 6 at front, 4 at back through patio   Home Layout: One level Home Equipment: Retail banker (4 wheels);BSC/3in1 Additional Comments: DME from husband's mother and borrowed Rollator    Prior Function Prior Level of Function : Independent/Modified Independent;Driving             Mobility Comments: no use of AD for mobility ADLs Comments: able to complete ADLs, IADLs. drives and grocery shops     Hand Dominance   Dominant Hand: Right    Extremity/Trunk Assessment   Upper Extremity Assessment Upper Extremity Assessment: Defer to OT evaluation    Lower Extremity Assessment Lower Extremity Assessment: Generalized weakness (Decreased strength and muscular endurance consistent with pre-op diagnosis. L weaker than R during functional mobility.)    Cervical / Trunk Assessment Cervical / Trunk Assessment: Back Surgery  Communication   Communication: No difficulties  Cognition Arousal/Alertness: Awake/alert Behavior During Therapy: WFL for tasks assessed/performed Overall Cognitive Status: Within Functional Limits for tasks assessed                                          General Comments General comments (skin integrity, edema, etc.): Husband present and supportive    Exercises     Assessment/Plan    PT Assessment Patient needs continued PT services  PT Problem List Decreased strength;Decreased activity tolerance;Decreased balance;Decreased mobility;Decreased knowledge of use of DME;Decreased safety awareness;Decreased knowledge of precautions;Pain       PT Treatment Interventions DME instruction;Gait training;Stair training;Functional mobility training;Therapeutic activities;Therapeutic exercise;Neuromuscular re-education;Patient/family education    PT Goals (Current goals can be found in the Care Plan section)  Acute Rehab PT  Goals Patient Stated Goal: home today PT Goal Formulation: With patient/family Time For Goal Achievement: 09/04/21 Potential to Achieve Goals: Good    Frequency Min 5X/week   Barriers to discharge        Co-evaluation               AM-PAC PT "6 Clicks" Mobility  Outcome Measure Help needed turning from your back to your side while in a flat bed without using bedrails?: None Help needed moving from lying on your back to sitting on the side of a flat bed without using bedrails?: None Help needed moving to and from a bed to a chair (including a wheelchair)?: A Little Help needed standing up from a chair using your arms (e.g., wheelchair or bedside chair)?: A Little Help needed to walk in hospital room?: A Little Help needed climbing 3-5 steps with a railing? : A Little 6 Click Score: 20    End of Session Equipment Utilized During Treatment: Gait belt Activity Tolerance: Patient tolerated treatment well Patient left: in bed;with call bell/phone within reach;with family/visitor present;with nursing/sitter in room Nurse Communication: Mobility status PT Visit Diagnosis: Unsteadiness on feet (R26.81);Pain Pain - part of body:  (back)    Time: 1779-3903 PT Time Calculation (min) (ACUTE ONLY): 24 min   Charges:   PT Evaluation $PT Eval Low Complexity: 1 Low PT Treatments $Gait Training: 8-22 mins        Conni Slipper, PT, DPT Acute Rehabilitation Services Pager: [phone] Office: [phone]   Marylynn Pearson 08/28/2021, 10:35 AM

## 2021-08-28 NOTE — Care Management CC44 (Signed)
Condition Code 44 Documentation Completed  Patient Details  Name: TIFFONY KITE MRN: [id number] Date of Birth: [birth year]   Condition Code 44 given:  Yes Patient signature on Condition Code 44 notice:  Yes Documentation of 2 MD's agreement:  Yes Code 44 added to claim:  Yes    Lorri Frederick, LCSW 08/28/2021, 9:46 AM

## 2021-08-28 NOTE — Discharge Instructions (Addendum)
Wound Care Keep incision covered and dry for two- three days.   Do not put any creams, lotions, or ointments on incision. Leave steri-strips on back.  They will fall off by themselves. Activity Walk each and every day, increasing distance each day. No lifting greater than 5 lbs.  Avoid excessive neck motion. No driving for 2 weeks; may ride as a passenger locally. If provided with back brace, wear when out of bed.  It is not necessary to wear brace in bed. Diet Resume your normal diet.  Call Your Doctor If Any of These Occur Redness, drainage, or swelling at the wound.  Temperature greater than 101 degrees. Severe pain not relieved by pain medication. Incision starts to come apart. Follow Up Appt Call today for appointment in 1-2 weeks ([phone]) or for problems.  If you have any hardware placed in your spine, you will need an x-ray before your appointment.  

## 2021-08-28 NOTE — Discharge Summary (Signed)
Physician Discharge Summary  Patient ID: Chelsey [name] MRN: [id number] DOB/AGE: 1946/01/14 75 y.o.  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  Postoperatively she is doing very well.  Preoperative back and lower extreme pain much improved.  Standing ambulating and voiding without difficulty.  Ready for discharge home.  Consults:   Significant Diagnostic Studies:   Treatments:   Discharge Exam: Blood pressure 111/62, pulse (!) 107, temperature 98.2 F (36.8 C), temperature source Oral, resp. rate 18, height 5\' 1"  (1.549 m), weight 62.8 kg, SpO2 96 %. Awake and alert.  Oriented and appropriate.  Motor and sensory function intact.  Wound clean and dry.  Chest and abdomen benign.  Disposition: Discharge disposition: 01-Home or Self Care        Allergies as of 08/28/2021       Reactions   Cefazolin Other (See Comments)   Too much bilirubin built up in liver (liver turned yellow)        Medication List     TAKE these medications    AZO-CRANBERRY PO Take 1 tablet by mouth in the morning and at bedtime.   calcium citrate-vitamin D 315-200 MG-UNIT tablet Commonly known as: CITRACAL+D Take 1 tablet by mouth in the morning and at bedtime.   clotrimazole-betamethasone cream Commonly known as: LOTRISONE APPLY  CREAM TOPICALLY TWICE DAILY   docusate sodium 100 MG capsule Commonly known as: COLACE Take 200 mg by mouth at bedtime.   estradiol 0.1 MG/GM vaginal cream Commonly known as: ESTRACE Place 1 Applicatorful vaginally 2 (two) times a week. Mondays & Fridays.   ibuprofen 200 MG tablet Commonly known as: ADVIL Take 800 mg by mouth every 8 (eight) hours as needed  (pain.).   losartan-hydrochlorothiazide 50-12.5 MG tablet Commonly known as: HYZAAR Take 1 tablet by mouth in the morning.   methocarbamol 500 MG tablet Commonly known as: Robaxin Take 1 tablet (500 mg total) by mouth 4 (four) times daily.   multivitamin with minerals Tabs tablet Take 1 tablet by mouth in the morning.   Oxycodone HCl 10 MG Tabs Take 1 tablet (10 mg total) by mouth every 3 (three) hours as needed for severe pain ((score 7 to 10)).   PROBIOTIC PO Take 1 capsule by mouth in the morning and at bedtime.   solifenacin 10 MG tablet Commonly known as: VESICARE Take 10 mg by mouth in the morning.   SYSTANE COMPLETE OP Place 1 drop into both eyes 2 (two) times daily as needed (dry/irritated eyes.).               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

## 2021-08-28 NOTE — Care Management Obs Status (Signed)
MEDICARE OBSERVATION STATUS NOTIFICATION   Patient Details  Name: Chelsey [name] MRN: [id number] Date of Birth: 1946/01/14   Medicare Observation Status Notification Given:  Yes    Lorri Frederick, LCSW 08/28/2021, 9:46 AM

## 2021-08-29 NOTE — Anesthesia Postprocedure Evaluation (Signed)
Anesthesia Post Note  Patient: Chelsey [name]  Procedure(s) Performed: Posterior Lumbar Interbody Fusion  - Lumbar three-Lumbar four - Lumbar four-Lumbar five (Back)     Patient location during evaluation: PACU Anesthesia Type: General Level of consciousness: awake and alert Pain management: pain level controlled Vital Signs Assessment: post-procedure vital signs reviewed and stable Respiratory status: spontaneous breathing, nonlabored ventilation, respiratory function stable and patient connected to nasal cannula oxygen Cardiovascular status: blood pressure returned to baseline and stable Postop Assessment: no apparent nausea or vomiting Anesthetic complications: no   No notable events documented.  Last Vitals:  Vitals:   08/28/21 0524 08/28/21 0748  BP: (!) 117/58 111/62  Pulse: 70 (!) 107  Resp: 18 18  Temp: 36.8 C 36.8 C  SpO2: 99% 96%    Last Pain:  Vitals:   08/28/21 0748  TempSrc: Oral  PainSc:                  [name]

## 2021-09-04 ENCOUNTER — Encounter (HOSPITAL_COMMUNITY): Payer: Self-pay | Admitting: Emergency Medicine

## 2021-09-04 ENCOUNTER — Emergency Department (HOSPITAL_COMMUNITY): Payer: Medicare Other

## 2021-09-04 ENCOUNTER — Emergency Department (HOSPITAL_COMMUNITY)
Admission: EM | Admit: 2021-09-04 | Discharge: 2021-09-04 | Disposition: A | Discharge status: Home / Self Care | Payer: Medicare Other | Attending: Emergency Medicine | Admitting: Emergency Medicine

## 2021-09-04 ENCOUNTER — Encounter (HOSPITAL_COMMUNITY): Payer: Self-pay

## 2021-09-04 ENCOUNTER — Other Ambulatory Visit: Payer: Self-pay

## 2021-09-04 ENCOUNTER — Other Ambulatory Visit (HOSPITAL_COMMUNITY): Payer: Self-pay | Admitting: Neurosurgery

## 2021-09-04 ENCOUNTER — Ambulatory Visit (HOSPITAL_BASED_OUTPATIENT_CLINIC_OR_DEPARTMENT_OTHER)
Admission: RE | Admit: 2021-09-04 | Discharge: 2021-09-04 | Disposition: A | Discharge status: Home / Self Care | Payer: Medicare Other | Source: Ambulatory Visit | Attending: Cardiovascular Disease | Admitting: Cardiovascular Disease

## 2021-09-04 DIAGNOSIS — J45909 Unspecified asthma, uncomplicated: Secondary | ICD-10-CM | POA: Diagnosis not present

## 2021-09-04 DIAGNOSIS — Z79899 Other long term (current) drug therapy: Secondary | ICD-10-CM | POA: Insufficient documentation

## 2021-09-04 DIAGNOSIS — R600 Localized edema: Secondary | ICD-10-CM | POA: Insufficient documentation

## 2021-09-04 DIAGNOSIS — E119 Type 2 diabetes mellitus without complications: Secondary | ICD-10-CM | POA: Insufficient documentation

## 2021-09-04 DIAGNOSIS — I824Y3 Acute embolism and thrombosis of unspecified deep veins of proximal lower extremity, bilateral: Secondary | ICD-10-CM | POA: Insufficient documentation

## 2021-09-04 DIAGNOSIS — M79605 Pain in left leg: Secondary | ICD-10-CM | POA: Diagnosis not present

## 2021-09-04 DIAGNOSIS — M79604 Pain in right leg: Secondary | ICD-10-CM | POA: Insufficient documentation

## 2021-09-04 DIAGNOSIS — I10 Essential (primary) hypertension: Secondary | ICD-10-CM | POA: Diagnosis not present

## 2021-09-04 LAB — CBC WITH DIFFERENTIAL/PLATELET
Abs Immature Granulocytes: 0.03 10*3/uL (ref 0.00–0.07)
Basophils Absolute: 0 10*3/uL (ref 0.0–0.1)
Basophils Relative: 1 %
Eosinophils Absolute: 0.2 10*3/uL (ref 0.0–0.5)
Eosinophils Relative: 3 %
HCT: 29.2 % — ABNORMAL LOW (ref 36.0–46.0)
Hemoglobin: 9.8 g/dL — ABNORMAL LOW (ref 12.0–15.0)
Immature Granulocytes: 1 %
Lymphocytes Relative: 20 %
Lymphs Abs: 1.2 10*3/uL (ref 0.7–4.0)
MCH: 33.2 pg (ref 26.0–34.0)
MCHC: 33.6 g/dL (ref 30.0–36.0)
MCV: 99 fL (ref 80.0–100.0)
Monocytes Absolute: 0.7 10*3/uL (ref 0.1–1.0)
Monocytes Relative: 11 %
Neutro Abs: 4.2 10*3/uL (ref 1.7–7.7)
Neutrophils Relative %: 64 %
Platelets: 219 10*3/uL (ref 150–400)
RBC: 2.95 MIL/uL — ABNORMAL LOW (ref 3.87–5.11)
RDW: 13.3 % (ref 11.5–15.5)
WBC: 6.4 10*3/uL (ref 4.0–10.5)
nRBC: 0 % (ref 0.0–0.2)

## 2021-09-04 LAB — BASIC METABOLIC PANEL
Anion gap: 7 (ref 5–15)
BUN: 17 mg/dL (ref 8–23)
CO2: 29 mmol/L (ref 22–32)
Calcium: 9.6 mg/dL (ref 8.9–10.3)
Chloride: 100 mmol/L (ref 98–111)
Creatinine, Ser: 0.71 mg/dL (ref 0.44–1.00)
GFR, Estimated: >60 mL/min (ref >60)
Glucose, Bld: 100 mg/dL — ABNORMAL HIGH (ref 70–99)
Potassium: 3.4 mmol/L — ABNORMAL LOW (ref 3.5–5.1)
Sodium: 136 mmol/L (ref 135–145)

## 2021-09-04 MED ORDER — IOHEXOL 350 MG/ML SOLN
60.0000 mL | Freq: Once | INTRAVENOUS | Status: AC | PRN
  Administered 2021-09-04: 60 mL via INTRAVENOUS

## 2021-09-04 MED ORDER — APIXABAN 5 MG PO TABS
10.0000 mg | ORAL_TABLET | Freq: Two times a day (BID) | ORAL | Status: DC
  Administered 2021-09-04: 10 mg via ORAL
  Filled 2021-09-04: qty 2

## 2021-09-04 MED ORDER — APIXABAN 5 MG PO TABS: 5.0000 mg | ORAL_TABLET | Freq: Two times a day (BID) | ORAL | Status: DC

## 2021-09-04 MED ORDER — APIXABAN (ELIQUIS) VTE STARTER PACK (10MG AND 5MG): ORAL_TABLET | ORAL | 0 refills | Status: DC

## 2021-09-04 NOTE — ED Triage Notes (Signed)
Patient has bilateral DVTs from US done today.

## 2021-09-04 NOTE — ED Notes (Signed)
Patient transported to CT 

## 2021-09-04 NOTE — ED Provider Notes (Addendum)
[place] EMERGENCY DEPARTMENT Provider Note   CSN: [id number] Arrival date & time: 09/04/21  1501     History Chief Complaint  Patient presents with   DVT    Chelsey [name] is a 75 y.o. female.  Patient diagnosed with blood clots in both lower legs.  Patient had lumbar fusion about 8 days ago.  Noticed leg swelling and had outpatient DVT study that showed bilateral blood clots.  She has no respiratory symptoms.  The history is provided by the patient.  Leg Pain Location:  Leg Time since incident:  4 days Leg location:  L lower leg and R lower leg Pain details:    Quality:  Aching   Radiates to:  Does not radiate   Severity:  Mild   Onset quality:  Gradual   Timing:  Constant   Progression:  Unchanged Chronicity:  New Relieved by:  Nothing Ineffective treatments:  None tried Associated symptoms: swelling   Associated symptoms: no back pain and no fever       Past Medical History:  Diagnosis Date   Arthritis    Asthma    Colon polyp [id number]   Complication of anesthesia    nausea   Cystitis    Diabetes mellitus without complication (HCC)    no meds   Diffuse cystic mastopathy    Elevated liver enzymes 08/2021   Hypertension    PONV (postoperative nausea and vomiting)    in distant past    Patient Active Problem List   Diagnosis Date Noted   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 08/27/2021   Elevated liver enzymes 12/12/2019   Overactive bladder 08/30/2019   Uterovaginal prolapse, incomplete 08/30/2019   Vaginal atrophy 08/30/2019   Hepatitis 08/05/2019   Uncontrolled hypertension 07/19/2019   Diabetes ([place]) 07/19/2019   Transaminitis 07/19/2019   Hyperbilirubinemia 07/19/2019   Pessary maintenance 04/16/2019   Hip pain, chronic, right 01/31/2019   Constipation, chronic 07/28/2018   Hx of adenomatous colonic polyps 07/28/2018   Hyperglycemia 06/29/2017   Primary osteoarthritis of first  carpometacarpal joint of right hand 12/02/2016   Extensor tenosynovitis of right wrist 07/03/2016   Pain involving joint of finger of left hand 03/27/2016   Cracked skin 12/04/2015   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   Tinea pedis of both feet 08/24/2015   Hypertension 05/22/2014   Osteopenia 05/22/2014   Fibrocystic breast disease 05/20/2013    Past Surgical History:  Procedure Laterality Date   bladder prolapse surgery     x 2 - 2021, 2022   BREAST BIOPSY Bilateral    neg   BREAST CYST ASPIRATION Right    neg   BREAST LUMPECTOMY Right 1976   BREAST SURGERY Left 2002   excision   COLONOSCOPY  2011, 09/2013   COLONOSCOPY WITH PROPOFOL N/A 10/29/2018   Procedure: COLONOSCOPY WITH PROPOFOL;  Surgeon: Manya Silvas, MD;  Location: [facility] ENDOSCOPY;  Service: Endoscopy;  Laterality: N/A;   FINGER ARTHROPLASTY Right 03/24/2018   Procedure: FINGER ARTHROPLASTY (CMC TENDON INTERPOSITIONAL ARTHROPLASTY);  Surgeon: Leanor Kail, MD;  Location: [facility] ORS;  Service: Orthopedics;  Laterality: Right;   HAND SURGERY Left 04/2012   thumb   INCISION AND DRAINAGE HIP Right 06/17/2019   Procedure: right open gluteus medius repair, it band release, trochanteric bursectomy;  Surgeon: Leim Fabry, MD;  Location: [facility] ORS;  Service: Orthopedics;  Laterality: Right;   laparotomy     exploratory r/t liver   LASER ABLATION Bilateral 09/2014  Dr Wyn Quakerew   POLYPECTOMY  1994   TUBAL LIGATION  1976   VAGINAL HYSTERECTOMY  2021     OB History     Gravida  1   Para  1   Term      Preterm      AB      Living  1      SAB      IAB      Ectopic      Multiple      Live Births           Obstetric Comments  Menstrual age: [age]  Age 1st Pregnancy: 8920         Family History  Problem Relation Age of Onset   Cancer Father        lung   Cancer Mother        colon   Cancer Sister        lung   Pulmonary fibrosis Brother    Pulmonary fibrosis Sister    Breast cancer Neg Hx      Social History   Tobacco Use   Smoking status: Never   Smokeless tobacco: Never  Vaping Use   Vaping Use: Never used  Substance Use Topics   Alcohol use: No   Drug use: Never    Home Medications Prior to Admission medications   Medication Sig Start Date End Date Taking? Authorizing Provider  AZO-CRANBERRY PO Take 1 tablet by mouth in the morning and at bedtime.    [provider]  calcium citrate-vitamin D (CITRACAL+D) 315-200 MG-UNIT tablet Take 1 tablet by mouth in the morning and at bedtime.    [provider]  clotrimazole-betamethasone (LOTRISONE) cream APPLY  CREAM TOPICALLY TWICE DAILY 06/13/21   Vivi BarrackWagoner, Matthew R, DPM  docusate sodium (COLACE) 100 MG capsule Take 200 mg by mouth at bedtime.    [provider]  estradiol (ESTRACE) 0.1 MG/GM vaginal cream Place 1 Applicatorful vaginally 2 (two) times a week. Mondays & Fridays.    [provider]  ibuprofen (ADVIL) 200 MG tablet Take 800 mg by mouth every 8 (eight) hours as needed (pain.).    [provider]  losartan-hydrochlorothiazide (HYZAAR) 50-12.5 MG tablet Take 1 tablet by mouth in the morning.    [provider]  methocarbamol (ROBAXIN) 500 MG tablet Take 1 tablet (500 mg total) by mouth 4 (four) times daily. 08/28/21   Julio SicksPool, Henry, MD  Multiple Vitamin (MULTIVITAMIN WITH MINERALS) TABS tablet Take 1 tablet by mouth in the morning.    [provider]  oxyCODONE 10 MG TABS Take 1 tablet (10 mg total) by mouth every 3 (three) hours as needed for severe pain ((score 7 to 10)). 08/28/21   Julio SicksPool, Henry, MD  Probiotic Product (PROBIOTIC PO) Take 1 capsule by mouth in the morning and at bedtime.    [provider]  Propylene Glycol (SYSTANE COMPLETE OP) Place 1 drop into both eyes 2 (two) times daily as needed (dry/irritated eyes.).    [provider]  solifenacin (VESICARE) 10 MG tablet Take 10 mg by mouth in the morning. 06/25/21   [provider]    Allergies    Cefazolin  Review of Systems   Review of Systems  Constitutional:  Negative for chills and fever.  HENT:  Negative for ear pain and sore throat.   Eyes:  Negative for pain and visual disturbance.  Respiratory:  Negative for cough and shortness of breath.  Cardiovascular:  Positive for leg swelling. Negative for chest pain and palpitations.  Gastrointestinal:  Negative for abdominal pain and vomiting.  Genitourinary:  Negative for dysuria and hematuria.  Musculoskeletal:  Negative for arthralgias and back pain.  Skin:  Negative for color change and rash.  Neurological:  Negative for seizures and syncope.  All other systems reviewed and are negative.  Physical Exam Updated Vital Signs BP (!) 162/78   Pulse 71   Temp 97.8 F (36.6 C) (Oral)   Resp 17   SpO2 100%   Physical Exam Vitals and nursing note reviewed.  Constitutional:      General: She is not in acute distress.    Appearance: She is well-developed. She is not ill-appearing.  HENT:     Head: Normocephalic and atraumatic.     Nose: Nose normal.     Mouth/Throat:     Mouth: Mucous membranes are moist.  Eyes:     Extraocular Movements: Extraocular movements intact.     Conjunctiva/sclera: Conjunctivae normal.     Pupils: Pupils are equal, round, and reactive to light.  Cardiovascular:     Rate and Rhythm: Normal rate and regular rhythm.     Pulses: Normal pulses.     Heart sounds: Normal heart sounds. No murmur heard. Pulmonary:     Effort: Pulmonary effort is normal. No respiratory distress.     Breath sounds: Normal breath sounds.  Abdominal:     General: Abdomen is flat.     Palpations: Abdomen is soft.     Tenderness: There is no abdominal tenderness.  Musculoskeletal:        General: No swelling.     Cervical back: Neck supple.     Right lower leg: Edema present.     Left lower leg: Edema present.  Skin:    General: Skin is warm and dry.     Capillary Refill:  Capillary refill takes less than 2 seconds.  Neurological:     General: No focal deficit present.     Mental Status: She is alert.  Psychiatric:        Mood and Affect: Mood normal.    ED Results / Procedures / Treatments   Labs (all labs ordered are listed, but only abnormal results are displayed) Labs Reviewed  BASIC METABOLIC PANEL - Abnormal; Notable for the following components:      Result Value   Potassium 3.4 (*)    Glucose, Bld 100 (*)    All other components within normal limits  CBC WITH DIFFERENTIAL/PLATELET - Abnormal; Notable for the following components:   RBC 2.95 (*)    Hemoglobin 9.8 (*)    HCT 29.2 (*)    All other components within normal limits  RESP PANEL BY RT-PCR (FLU A&B, COVID) [id number]    EKG EKG Interpretation  Date/Time:  Wednesday September 04 2021 16:00:48 EST Ventricular Rate:  74 PR Interval:  144 QRS Duration: 82 QT Interval:  366 QTC Calculation: 406 R Axis:   94 Text Interpretation: Sinus rhythm with occasional Premature ventricular complexes Rightward axis T wave abnormality, consider inferolateral ischemia Abnormal ECG Confirmed by Lennice Sites ([id number]) on 09/04/2021 5:25:26 PM  Radiology CT Angio Chest PE W and/or Wo Contrast  Result Date: 09/04/2021 CLINICAL DATA:  Recent back surgery, extremity swelling EXAM: CT ANGIOGRAPHY CHEST WITH CONTRAST TECHNIQUE: Multidetector CT imaging of the chest was performed using the standard protocol during bolus administration of intravenous contrast. Multiplanar CT image reconstructions and MIPs were obtained to  evaluate the vascular anatomy. CONTRAST:  84mL OMNIPAQUE IOHEXOL 350 MG/ML SOLN COMPARISON:  None. FINDINGS: Cardiovascular: This is a technically adequate evaluation of the pulmonary vasculature. No filling defects or pulmonary emboli. The heart is unremarkable without pericardial effusion. No evidence of thoracic aortic aneurysm or dissection. Mediastinum/Nodes: No enlarged mediastinal, hilar,  or axillary lymph nodes. Thyroid gland, trachea, and esophagus demonstrate no significant findings. Lungs/Pleura: There is a solid 7 x 7 x 8 mm left upper lobe pulmonary nodule, reference image 37/6 and 80/8. No other pulmonary nodules or masses. No acute airspace disease, effusion, or pneumothorax. Mosaic ground-glass attenuation within the mid to lower lung zones may reflect areas of air trapping. Central airways are patent. Upper Abdomen: No acute abnormality. Musculoskeletal: No acute or destructive bony lesions. Reconstructed images demonstrate no additional findings. Review of the MIP images confirms the above findings. IMPRESSION: 1. No evidence of pulmonary embolus. 2. 8 mm solid left upper lobe pulmonary nodule. Recommend a non-contrast Chest CT at 6-12 months. If patient is high risk for malignancy, recommend an additional non-contrast Chest CT at 18-24 months; if patient is low risk for malignancy a non-contrast Chest CT at 18-24 months is optional. These guidelines do not apply to immunocompromised patients and patients with cancer. Follow up in patients with significant comorbidities as clinically warranted. For lung cancer screening, adhere to Lung-RADS guidelines. Reference: Radiology. 2017; 284(1):228-43. 3. Likely areas of air trapping at the lung bases, which may reflect sequela of reactive airway disease. No evidence of pneumonia. Electronically Signed   By: Randa Ngo M.D.   On: 09/04/2021 18:59   VAS [place] LOWER EXTREMITY VENOUS (DVT)  Result Date: 09/04/2021  Lower Venous DVT Study Patient Name:  Chelsey [name] Bochicchio  Date of Exam:   09/04/2021 Medical Rec #: [id number]             Accession #:    [id number] Date of Birth: 1946-05-29            Patient Gender: F Patient Age:   1 years Exam Location:  [facility] Procedure:      VAS [place] LOWER EXTREMITY VENOUS (DVT) Referring Phys: Mallie Mussel POOL --------------------------------------------------------------------------------  Indications: Patient  has had three days of bilateral leg swelling. She denies chest pain and SOB.  Risk Factors: Surgery Back surgery 08/27/2021. Comparison Study: None Performing Technologist: Alecia Mackin RVT, RDCS (AE), RDMS  Examination Guidelines: A complete evaluation includes B-mode imaging, spectral Doppler, color Doppler, and power Doppler as needed of all accessible portions of each vessel. Bilateral testing is considered an integral part of a complete examination. Limited examinations for reoccurring indications may be performed as noted. The reflux portion of the exam is performed with the patient in reverse Trendelenburg.  +---------+---------------+---------+-----------+---------------+-------------+ RIGHT    CompressibilityPhasicitySpontaneityProperties     Thrombus                                                                 Aging         +---------+---------------+---------+-----------+---------------+-------------+ CFV      Full           Yes      Yes                                     +---------+---------------+---------+-----------+---------------+-------------+  SFJ      Full           Yes      Yes                                     +---------+---------------+---------+-----------+---------------+-------------+ FV Prox  Full           Yes      Yes                                     +---------+---------------+---------+-----------+---------------+-------------+ FV Mid   Full           Yes      Yes                                     +---------+---------------+---------+-----------+---------------+-------------+ FV DistalFull           Yes      Yes                                     +---------+---------------+---------+-----------+---------------+-------------+ PFV      Full                                                            +---------+---------------+---------+-----------+---------------+-------------+ POP      Partial        Yes      Yes                                      +---------+---------------+---------+-----------+---------------+-------------+ PTV      Partial        Yes      Yes        with striations              +---------+---------------+---------+-----------+---------------+-------------+ PERO     None           No       No         rigid                                                                    w/compression                +---------+---------------+---------+-----------+---------------+-------------+ Gastroc  Partial        Yes      Yes                                     +---------+---------------+---------+-----------+---------------+-------------+ GSV      Full           Yes      Yes                                     +---------+---------------+---------+-----------+---------------+-------------+   +---------+---------------+---------+-----------+---------------+-------------+  LEFT     CompressibilityPhasicitySpontaneityProperties     Thrombus                                                                 Aging         +---------+---------------+---------+-----------+---------------+-------------+ CFV      Full           Yes      Yes                                     +---------+---------------+---------+-----------+---------------+-------------+ SFJ      Full           Yes      Yes                                     +---------+---------------+---------+-----------+---------------+-------------+ FV Prox  Full           Yes      Yes                                     +---------+---------------+---------+-----------+---------------+-------------+ FV Mid   Full           Yes      Yes                                     +---------+---------------+---------+-----------+---------------+-------------+ FV DistalFull           Yes      Yes                                      +---------+---------------+---------+-----------+---------------+-------------+ PFV      Full                                                            +---------+---------------+---------+-----------+---------------+-------------+ POP      Full           Yes      Yes                                     +---------+---------------+---------+-----------+---------------+-------------+ PTV      Full           Yes      Yes                                     +---------+---------------+---------+-----------+---------------+-------------+ PERO     None           No       No  rigid                                                                    w/compression                +---------+---------------+---------+-----------+---------------+-------------+ Gastroc  Full           Yes      Yes                                     +---------+---------------+---------+-----------+---------------+-------------+ GSV      Full           Yes      Yes                                     +---------+---------------+---------+-----------+---------------+-------------+    Findings reported to Rodolph Bong at Dr. Marchelle Folks office at 2:35 pm.  Summary: RIGHT: - Findings consistent with acute non-occlusive deep vein thrombosis involving the right popliteal vein, gastrocnemius, one of the paired right posterior tibial veins, and occlusive DVT in right peroneal veins.  LEFT: - Findings consistent with acute occlusive deep vein thrombosis involving the left peroneal veins.  *See table(s) above for measurements and observations.    Preliminary     Procedures Procedures   Medications Ordered in ED Medications  apixaban (ELIQUIS) tablet 10 mg (has no administration in time range)    Followed by  apixaban (ELIQUIS) tablet 5 mg (has no administration in time range)  iohexol (OMNIPAQUE) 350 MG/ML injection 60 mL (60 mLs Intravenous Contrast Given 09/04/21 1834)    ED Course  I have  reviewed the triage vital signs and the nursing notes.  Pertinent labs & imaging results that were available during my care of the patient were reviewed by me and considered in my medical decision making (see chart for details).    MDM Rules/Calculators/A&P                           Brihanna R Gartley is here after being diagnosed with blood clot.  Patient with recent lumbar fusion surgery about 8 days ago with neurosurgery.  Leg swelling the last several days.  Outpatient DVT study showed nonocclusive clot in the right popliteal and gastrocnemius and one of the paired posterior tibial veins.  There were occlusive DVTs of the right and left peroneal veins.  She has good pulses on exam.  No real significant pain.  Given bilateral DVTs we will get a PE scan to evaluate for PE.  EKG with sinus rhythm with some T wave inversions.  No chest pain or shortness of breath.  We will get basic labs.  Dispo per PE scan.  PE scan negative for blood clot.  Does have incidental pulmonary nodule may need to repeat CT scan in the future.  Dr. Trenton Gammon was contacted by neurosurgical team on call and he is okay with oral anticoagulation.  Patient safe for discharge understands return precautions.  This chart was dictated using voice recognition software.  Despite best efforts to proofread,  errors can occur  which can change the documentation meaning.   Final Clinical Impression(s) / ED Diagnoses Final diagnoses:  Acute deep vein thrombosis (DVT) of proximal vein of both lower extremities [facility])    Rx / DC Orders ED Discharge Orders     None        Lennice Sites, DO 09/04/21 [place], [place], DO 09/04/21 1954

## 2021-09-04 NOTE — Discharge Instructions (Addendum)
   Follow up with Primary care about pulmonary nodule IMPRESSION: 1. No evidence of pulmonary embolus. 2. 8 mm solid left upper lobe pulmonary nodule. Recommend a non-contrast Chest CT at 6-12 months. If patient is high risk for malignancy, recommend an additional non-contrast Chest CT at 18-24 months; if patient is low risk for malignancy a non-contrast Chest CT at 18-24 months is optional. These guidelines do not apply to immunocompromised patients and patients with cancer. Follow up in patients with significant comorbidities as clinically warranted. For lung cancer screening, adhere to Lung-RADS guidelines. Reference: Radiology. 2017; 284(1):228-43. 3. Likely areas of air trapping at the lung bases, which may reflect sequela of reactive airway disease. No evidence of pneumonia  -------------------------------------------------------------------------------------------------------------- Information on my medicine - ELIQUIS (apixaban)  This medication education was reviewed with me or my healthcare representative as part of my discharge preparation.    Why was Eliquis prescribed for you? Eliquis was prescribed to treat blood clots that may have been found in the veins of your legs (deep vein thrombosis) or in your lungs (pulmonary embolism) and to reduce the risk of them occurring again.  What do You need to know about Eliquis ? The starting dose is 10 mg (two 5 mg tablets) taken TWICE daily for the FIRST SEVEN (7) DAYS, then on (enter date) 09/11/21, evening (PM),  the dose is reduced to ONE 5 mg tablet taken TWICE daily.  Eliquis may be taken with or without food.   Try to take the dose about the same time in the morning and in the evening. If you have difficulty swallowing the tablet whole please discuss with your pharmacist how to take the medication safely.  Take Eliquis exactly as prescribed and DO NOT stop taking Eliquis without talking to the doctor who prescribed the  medication.  Stopping may increase your risk of developing a new blood clot.  Refill your prescription before you run out.  After discharge, you should have regular check-up appointments with your healthcare provider that is prescribing your Eliquis.    What do you do if you miss a dose? If a dose of ELIQUIS is not taken at the scheduled time, take it as soon as possible on the same day and twice-daily administration should be resumed. The dose should not be doubled to make up for a missed dose.  Important Safety Information A possible side effect of Eliquis is bleeding. You should call your healthcare provider right away if you experience any of the following: Bleeding from an injury or your nose that does not stop. Unusual colored urine (red or dark brown) or unusual colored stools (red or black). Unusual bruising for unknown reasons. A serious fall or if you hit your head (even if there is no bleeding).  Some medicines may interact with Eliquis and might increase your risk of bleeding or clotting while on Eliquis. To help avoid this, consult your healthcare provider or pharmacist prior to using any new prescription or non-prescription medications, including herbals, vitamins, non-steroidal anti-inflammatory drugs (NSAIDs) and supplements.  This website has more information on Eliquis (apixaban): http://www.eliquis.com/eliquis/home

## 2021-09-04 NOTE — Progress Notes (Unsigned)
Patient had bilateral leg venous duplex exam at the [facility] office.   Right leg has non-occlusive DVT in the popiteal, gastrocnemius, one of the paired posterior tibial veins and occlusive DVT in the peroneal veins.   Left leg has occlusive DVT in peroneal veins.   Report called in to Dr. Dalphine Handing' office and message left with Mariane Baumgarten at 2:35 pm.   Patient instructed to pick up Rx at her pharmacy.  Carlos American, RVT, RDCS, RDMS [facility]

## 2021-09-05 ENCOUNTER — Telehealth: Payer: Self-pay | Admitting: Surgery

## 2021-09-05 NOTE — Telephone Encounter (Signed)
Received call from patient and spouse concerning not being able to afford the Eliquis prescription.  Provided a  free 30 day trial card to take to the pharmacy.  No other ED TOC needs identified.

## 2021-09-12 ENCOUNTER — Other Ambulatory Visit: Payer: Self-pay | Admitting: Podiatry

## 2021-10-08 ENCOUNTER — Other Ambulatory Visit: Payer: Self-pay | Admitting: Family Medicine

## 2021-10-08 DIAGNOSIS — Z1231 Encounter for screening mammogram for malignant neoplasm of breast: Secondary | ICD-10-CM

## 2021-10-09 ENCOUNTER — Other Ambulatory Visit: Payer: Self-pay

## 2021-10-09 ENCOUNTER — Ambulatory Visit
Admission: RE | Admit: 2021-10-09 | Discharge: 2021-10-09 | Disposition: A | Discharge status: Home / Self Care | Payer: Medicare Other | Source: Ambulatory Visit | Attending: Family Medicine | Admitting: Family Medicine

## 2021-10-09 DIAGNOSIS — Z1231 Encounter for screening mammogram for malignant neoplasm of breast: Secondary | ICD-10-CM

## 2021-10-17 ENCOUNTER — Other Ambulatory Visit: Payer: Self-pay | Admitting: Family Medicine

## 2021-10-17 DIAGNOSIS — N6320 Unspecified lump in the left breast, unspecified quadrant: Secondary | ICD-10-CM

## 2021-10-17 DIAGNOSIS — R928 Other abnormal and inconclusive findings on diagnostic imaging of breast: Secondary | ICD-10-CM

## 2021-10-18 ENCOUNTER — Encounter: Payer: Self-pay | Admitting: *Deleted

## 2021-10-21 ENCOUNTER — Encounter (INDEPENDENT_AMBULATORY_CARE_PROVIDER_SITE_OTHER): Payer: Medicare Other

## 2021-10-22 ENCOUNTER — Ambulatory Visit
Admission: RE | Admit: 2021-10-22 | Discharge: 2021-10-22 | Disposition: A | Discharge status: Home / Self Care | Payer: Medicare Other | Source: Ambulatory Visit | Attending: Family Medicine | Admitting: Family Medicine

## 2021-10-22 ENCOUNTER — Other Ambulatory Visit: Payer: Self-pay

## 2021-10-22 DIAGNOSIS — N6320 Unspecified lump in the left breast, unspecified quadrant: Secondary | ICD-10-CM

## 2021-10-22 DIAGNOSIS — R928 Other abnormal and inconclusive findings on diagnostic imaging of breast: Secondary | ICD-10-CM | POA: Insufficient documentation

## 2021-10-23 ENCOUNTER — Inpatient Hospital Stay: Payer: Medicare Other | Attending: Oncology | Admitting: Oncology

## 2021-10-23 ENCOUNTER — Inpatient Hospital Stay: Payer: Medicare Other

## 2021-10-23 ENCOUNTER — Other Ambulatory Visit (HOSPITAL_COMMUNITY): Payer: Self-pay

## 2021-10-23 ENCOUNTER — Encounter: Payer: Self-pay | Admitting: Oncology

## 2021-10-23 VITALS — BP 148/73 | HR 89 | Temp 97.0°F | Resp 16 | Ht 61.0 in | Wt 136.0 lb

## 2021-10-23 DIAGNOSIS — Z800 Family history of malignant neoplasm of digestive organs: Secondary | ICD-10-CM | POA: Insufficient documentation

## 2021-10-23 DIAGNOSIS — Z7901 Long term (current) use of anticoagulants: Secondary | ICD-10-CM | POA: Diagnosis not present

## 2021-10-23 DIAGNOSIS — I82723 Chronic embolism and thrombosis of deep veins of upper extremity, bilateral: Secondary | ICD-10-CM

## 2021-10-23 DIAGNOSIS — Z801 Family history of malignant neoplasm of trachea, bronchus and lung: Secondary | ICD-10-CM | POA: Insufficient documentation

## 2021-10-23 DIAGNOSIS — I82503 Chronic embolism and thrombosis of unspecified deep veins of lower extremity, bilateral: Secondary | ICD-10-CM | POA: Diagnosis present

## 2021-10-23 MED ORDER — APIXABAN 5 MG PO TABS: 5.0000 mg | ORAL_TABLET | Freq: Two times a day (BID) | ORAL | 3 refills | Status: AC

## 2021-10-27 NOTE — Progress Notes (Signed)
Hematology/Oncology Consult note [facility] [phone] [phone]  Patient Care Team: Juluis Pitch, MD as PCP - General (Family Medicine) Juluis Pitch, MD as Referring Physician (Family Medicine) Sindy Guadeloupe, MD as Consulting Physician (Hematology and Oncology) Adron Bene as Consulting Physician (Gastroenterology)   Name of the patient: Chelsey [name]  [id number]  Jul 30, 1946    Reason for referral- h/o DVT   Referring physician- Dr. Lovie Macadamia  Date of visit: 10/27/21   History of presenting illness- Patient is a 76 year old female with history of ongoing low back pain who underwent lumbar decompression and fusion surgery for scoliosis due to degenerative disease of the spine on 08/27/2021.  Shortly following that patient developed pain and swelling in her bilateral lower extremities on 09/04/2021.  Ultrasound showed acute nonocclusive DVT in the right popliteal vein and gastrocnemius and one of the paired right posterior tibial veins and occlusive DVT in the right peroneal veins.  Acute occlusive DVT involving the left peroneal veins.  CT angio chest did not show any evidence of PE.  Patient was initially started on Eliquis but patient found that cost prohibitive and was therefore switched to Coumadin.  Patient is presently on Coumadin 5 mg daily her most recent INR was 1.5119 2023.  She is here for further recommendations.  No prior history of DVT or PE.  No family history of thrombosis.  Back pain is improving since her back surgery.  She still has some lower extremity edema. 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 Negative for chills, fever and weight loss.  HENT:  Negative for congestion, ear discharge and nosebleeds.   Eyes:  Negative for  blurred vision.  Respiratory:  Negative for cough, hemoptysis, sputum production, shortness of breath and wheezing.   Cardiovascular:  Negative for chest pain, palpitations, orthopnea and claudication.  Gastrointestinal:  Negative for abdominal pain, blood in stool, constipation, diarrhea, heartburn, melena, nausea and vomiting.  Genitourinary:  Negative for dysuria, flank pain, frequency, hematuria and urgency.  Musculoskeletal:  Negative for back pain, joint pain and myalgias.  Skin:  Negative for rash.  Neurological:  Negative for dizziness, tingling, focal weakness, seizures, weakness and headaches.  Endo/Heme/Allergies:  Does not bruise/bleed easily.  Psychiatric/Behavioral:  Negative for depression and suicidal ideas. The patient does not have insomnia.    Allergies  Allergen Reactions   Cefazolin Other (See Comments)    Too much bilirubin built up in liver (liver turned yellow)    Patient Active Problem List   Diagnosis Date Noted   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 08/27/2021   Elevated liver enzymes 12/12/2019   Overactive bladder 08/30/2019   Uterovaginal prolapse, incomplete 08/30/2019   Vaginal atrophy 08/30/2019   Hepatitis 08/05/2019   Uncontrolled hypertension 07/19/2019   Diabetes ([place]) 07/19/2019   Transaminitis 07/19/2019   Hyperbilirubinemia 07/19/2019   Pessary maintenance 04/16/2019   Hip pain, chronic, right 01/31/2019   Constipation, chronic 07/28/2018   Hx of adenomatous colonic polyps 07/28/2018   Hyperglycemia 06/29/2017   Primary osteoarthritis of first carpometacarpal joint of right hand 12/02/2016   Extensor tenosynovitis of right wrist 07/03/2016   Pain involving joint of finger of left hand 03/27/2016   Cracked skin 12/04/2015   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   Tinea pedis of both feet  08/24/2015   Hypertension 05/22/2014   Osteopenia 05/22/2014   Fibrocystic breast disease 05/20/2013     Past Medical History:   Diagnosis Date   Acute deep vein thrombosis (DVT) of both lower extremities (HCC)    Arthritis    Asthma    Blood clotting disorder (HCC)    Colon polyp [id number]   Complication of anesthesia    nausea   Cystitis    Diabetes mellitus without complication ([place])    no meds   Diffuse cystic mastopathy    Elevated liver enzymes 08/2021   Hypertension    PONV (postoperative nausea and vomiting)    in distant past     Past Surgical History:  Procedure Laterality Date   bladder prolapse surgery     x 2 - 2021, 2022   BREAST BIOPSY Bilateral    neg   BREAST CYST ASPIRATION Right    neg   BREAST LUMPECTOMY Right 1976   BREAST SURGERY Left 2002   excision   COLONOSCOPY  2011, 09/2013   COLONOSCOPY WITH PROPOFOL N/A 10/29/2018   Procedure: COLONOSCOPY WITH PROPOFOL;  Surgeon: Manya Silvas, MD;  Location: [facility] ENDOSCOPY;  Service: Endoscopy;  Laterality: N/A;   FINGER ARTHROPLASTY Right 03/24/2018   Procedure: FINGER ARTHROPLASTY (CMC TENDON INTERPOSITIONAL ARTHROPLASTY);  Surgeon: Leanor Kail, MD;  Location: [facility] ORS;  Service: Orthopedics;  Laterality: Right;   HAND SURGERY Left 04/2012   thumb   INCISION AND DRAINAGE HIP Right 06/17/2019   Procedure: right open gluteus medius repair, it band release, trochanteric bursectomy;  Surgeon: Leim Fabry, MD;  Location: [facility] ORS;  Service: Orthopedics;  Laterality: Right;   laparotomy     exploratory r/t liver   LASER ABLATION Bilateral 09/2014   Dr Lucky Cowboy   POLYPECTOMY  1994   TUBAL LIGATION  1976   VAGINAL HYSTERECTOMY  2021    Social History   Socioeconomic History   Marital status: Married    Spouse name: Not on file   Number of children: Not on file   Years of education: Not on file   Highest education level: Not on file  Occupational History   Not on file  Tobacco Use   Smoking status: Never    Passive exposure: Never   Smokeless tobacco: Never  Vaping Use   Vaping Use: Never used  Substance and Sexual  Activity   Alcohol use: No   Drug use: Never   Sexual activity: Not Currently    Birth control/protection: Post-menopausal, Surgical    Comment: Hysterectomy  Other Topics Concern   Not on file  Social History Narrative   Not on file   Social Determinants of Health   Financial Resource Strain: Not on file  Food Insecurity: Not on file  Transportation Needs: Not on file  Physical Activity: Not on file  Stress: Not on file  Social Connections: Not on file  Intimate Partner Violence: Not on file     Family History  Problem Relation Age of Onset   Colon cancer Mother        colon   Lung cancer Father        lung   Lung cancer Sister        lung   Pulmonary fibrosis Sister    Pulmonary fibrosis Brother    Breast cancer Neg Hx      Current Outpatient Medications:    apixaban (ELIQUIS) 5 MG TABS tablet, Take 1 tablet (5 mg total) by mouth 2 (two) times  daily., Disp: 60 tablet, Rfl: 3   AZO-CRANBERRY PO, Take 1 tablet by mouth in the morning and at bedtime., Disp: , Rfl:    calcium citrate-vitamin D (CITRACAL+D) 315-200 MG-UNIT tablet, Take 1 tablet by mouth in the morning and at bedtime., Disp: , Rfl:    clotrimazole-betamethasone (LOTRISONE) cream, APPLY  CREAM TOPICALLY TWICE DAILY, Disp: 45 g, Rfl: 0   docusate sodium (COLACE) 100 MG capsule, Take 200 mg by mouth at bedtime., Disp: , Rfl:    estradiol (ESTRACE) 0.1 MG/GM vaginal cream, Place 1 Applicatorful vaginally 2 (two) times a week. Mondays & Fridays., Disp: , Rfl:    losartan-hydrochlorothiazide (HYZAAR) 50-12.5 MG tablet, Take 1 tablet by mouth in the morning., Disp: , Rfl:    Multiple Vitamin (MULTIVITAMIN WITH MINERALS) TABS tablet, Take 1 tablet by mouth in the morning., Disp: , Rfl:    polyethylene glycol powder (GLYCOLAX/MIRALAX) 17 GM/SCOOP powder, Take by mouth., Disp: , Rfl:    Propylene Glycol (SYSTANE COMPLETE OP), Place 1 drop into both eyes 2 (two) times daily as needed (dry/irritated eyes.)., Disp: ,  Rfl:    solifenacin (VESICARE) 10 MG tablet, Take 10 mg by mouth in the morning., Disp: , Rfl:    warfarin (COUMADIN) 5 MG tablet, Take 5 mg by mouth daily., Disp: , Rfl:   Current Facility-Administered Medications:    sulfamethoxazole-trimethoprim (BACTRIM DS) 800-160 MG per tablet 1 tablet, 1 tablet, Oral, Q12H, Hollice Espy, MD, 1 tablet at 03/26/21 1538   Physical exam:  Vitals:   10/23/21 1451  BP: (!) 148/73  Pulse: 89  Resp: 16  Temp: (!) 97 F (36.1 C)  TempSrc: Tympanic  SpO2: 97%  Weight: 136 lb (61.7 kg)  Height: 5\' 1"  (1.549 m)   Physical Exam Cardiovascular:     Rate and Rhythm: Normal rate and regular rhythm.     Heart sounds: Normal heart sounds.  Pulmonary:     Effort: Pulmonary effort is normal.     Breath sounds: Normal breath sounds.  Musculoskeletal:     Cervical back: Normal range of motion.     Comments: Back brace in place. Trace b/l LE edema. pr ironomin is doneent venules seen I the lower extremities  Skin:    General: Skin is warm and dry.  Neurological:     Mental Status: She is alert and oriented to person, place, and time.       CMP Latest Ref Rng & Units 09/04/2021  Glucose 70 - 99 mg/dL 100(H)  BUN 8 - 23 mg/dL 17  Creatinine 0.44 - 1.00 mg/dL 0.71  Sodium 135 - 145 mmol/L 136  Potassium 3.5 - 5.1 mmol/L 3.4(L)  Chloride 98 - 111 mmol/L 100  CO2 22 - 32 mmol/L 29  Calcium 8.9 - 10.3 mg/dL 9.6  Total Protein 6.5 - 8.1 g/dL -  Total Bilirubin 0.3 - 1.2 mg/dL -  Alkaline Phos 38 - 126 U/L -  AST 15 - 41 U/L -  ALT 0 - 44 U/L -   CBC Latest Ref Rng & Units 09/04/2021  WBC 4.0 - 10.5 K/uL 6.4  Hemoglobin 12.0 - 15.0 g/dL 9.8(L)  Hematocrit 36.0 - 46.0 % 29.2(L)  Platelets 150 - 400 K/uL 219    No images are attached to the encounter.  US BREAST LTD UNI LEFT INC AXILLA  Result Date: 10/22/2021 CLINICAL DATA:  LEFT breast asymmetry EXAM: DIGITAL DIAGNOSTIC UNILATERAL LEFT MAMMOGRAM WITH TOMOSYNTHESIS AND CAD; ULTRASOUND LEFT  BREAST LIMITED TECHNIQUE: Left digital diagnostic  mammography and breast tomosynthesis was performed. The images were evaluated with computer-aided detection.; Targeted ultrasound examination of the left breast was performed. COMPARISON:  Previous exam(s). ACR Breast Density Category b: There are scattered areas of fibroglandular density. FINDINGS: The previously described finding on MLO view does not persist with additional views, consistent with superimposed fibroglandular tissue. Spot compression tomosynthesis views confirm persistence of an oval circumscribed mass in the LEFT slightly inner breast at posterior depth. This is favored to have subtly been present on prior mammograms and has waxed and waned, most consistent with a cyst (for example November 5, [id number] CC slice 41). Stable postsurgical changes in the upper outer breast. On physical exam, no suspicious mass is appreciated. Targeted ultrasound was performed of the LEFT inner breast. At the inner retroareolar breast , there is an oval circumscribed anechoic mass with posterior acoustic enhancement. It measures 5 x 4 x 5 mm and is consistent with a benign simple cyst. At 10 o'clock 1 cm from the nipple, there is an oval circumscribed anechoic mass with posterior acoustic enhancement. It measures 3 x 1 x 3 mm and is consistent with a benign simple cyst. At 9 o'clock 1 cm from the nipple, there is an oval circumscribed anechoic mass with posterior acoustic enhancement. It measures 3 x 1 by 3 mm and is consistent with a benign simple cyst. This is favored to correspond to the site of screening mammographic concern. Multiple additional subcentimeter simple and minimally complicated cysts were noted during real-time examination. IMPRESSION: There is a benign cyst at the site of screening mammographic concern. RECOMMENDATION: Screening mammogram in one year.(Code:SM-B-01Y) I have discussed the findings and recommendations with the patient. If applicable, a  reminder letter will be sent to the patient regarding the next appointment. BI-RADS CATEGORY  2: Benign. Electronically Signed   By: Valentino Saxon M.D.   On: 10/22/2021 10:37  MM DIAG BREAST TOMO UNI LEFT  Result Date: 10/22/2021 CLINICAL DATA:  LEFT breast asymmetry EXAM: DIGITAL DIAGNOSTIC UNILATERAL LEFT MAMMOGRAM WITH TOMOSYNTHESIS AND CAD; ULTRASOUND LEFT BREAST LIMITED TECHNIQUE: Left digital diagnostic mammography and breast tomosynthesis was performed. The images were evaluated with computer-aided detection.; Targeted ultrasound examination of the left breast was performed. COMPARISON:  Previous exam(s). ACR Breast Density Category b: There are scattered areas of fibroglandular density. FINDINGS: The previously described finding on MLO view does not persist with additional views, consistent with superimposed fibroglandular tissue. Spot compression tomosynthesis views confirm persistence of an oval circumscribed mass in the LEFT slightly inner breast at posterior depth. This is favored to have subtly been present on prior mammograms and has waxed and waned, most consistent with a cyst (for example November 5, [id number] CC slice 41). Stable postsurgical changes in the upper outer breast. On physical exam, no suspicious mass is appreciated. Targeted ultrasound was performed of the LEFT inner breast. At the inner retroareolar breast , there is an oval circumscribed anechoic mass with posterior acoustic enhancement. It measures 5 x 4 x 5 mm and is consistent with a benign simple cyst. At 10 o'clock 1 cm from the nipple, there is an oval circumscribed anechoic mass with posterior acoustic enhancement. It measures 3 x 1 x 3 mm and is consistent with a benign simple cyst. At 9 o'clock 1 cm from the nipple, there is an oval circumscribed anechoic mass with posterior acoustic enhancement. It measures 3 x 1 by 3 mm and is consistent with a benign simple cyst. This is favored to correspond to the site  of screening  mammographic concern. Multiple additional subcentimeter simple and minimally complicated cysts were noted during real-time examination. IMPRESSION: There is a benign cyst at the site of screening mammographic concern. RECOMMENDATION: Screening mammogram in one year.(Code:SM-B-01Y) I have discussed the findings and recommendations with the patient. If applicable, a reminder letter will be sent to the patient regarding the next appointment. BI-RADS CATEGORY  2: Benign. Electronically Signed   By: Valentino Saxon M.D.   On: 10/22/2021 10:37  MM 3D SCREEN BREAST BILATERAL  Result Date: 10/10/2021 CLINICAL DATA:  Screening. EXAM: DIGITAL SCREENING BILATERAL MAMMOGRAM WITH TOMOSYNTHESIS AND CAD TECHNIQUE: Bilateral screening digital craniocaudal and mediolateral oblique mammograms were obtained. Bilateral screening digital breast tomosynthesis was performed. The images were evaluated with computer-aided detection. COMPARISON:  Previous exam(s). ACR Breast Density Category b: There are scattered areas of fibroglandular density. FINDINGS: In the left breast, a possible mass warrants further evaluation. In the right breast, no findings suspicious for malignancy. IMPRESSION: Further evaluation is suggested for a possible mass in the left breast. RECOMMENDATION: Diagnostic mammogram and possibly ultrasound of the left breast. (Code:[id number]-51M) The patient will be contacted regarding the findings, and additional imaging will be scheduled. BI-RADS CATEGORY  0: Incomplete. Need additional imaging evaluation and/or prior mammograms for comparison. Electronically Signed   By: Dorise Bullion III M.D.   On: 10/10/2021 09:08   Assessment and plan- Patient is a 76 y.o. female with history of bilateral lower extremity DVT referred for further management  Patient has never had a DVT or PE in the past.  She underwent back surgery on 08/27/2022 and developed a bilateral lower extremity DVT a week later.  This therefore appears  to be a provoked event and I would not recommend doing a hypercoagulable work-up in the setting.  I would recommend that she should stay on the blood thinner for 6 months and we will reevaluate with a D-dimer at that time  Patient is changing insurance in the new year and therefore we will need to reach out to see if Eliquis is doable at this time.  If that is not possible we will also look into Xarelto.  However if Coumadin remains only viable option for financial reasons she will continue to remain on Coumadin until April 2023 with periodic monitoring of PT/INR which can be done by Dr. Reuel Boom office every 1 to 2 weeks.   Thank you for this kind referral and the opportunity to participate in the care of this  Patient   Visit Diagnosis 1. 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 Randa Evens, MD, MPH [facility] [id number] 10/27/2021

## 2021-10-27 NOTE — Addendum Note (Signed)
Addended by: Corene Cornea on: 10/27/2021 11:41 AM   Modules accepted: Orders

## 2021-11-04 ENCOUNTER — Telehealth: Payer: Self-pay

## 2021-11-04 ENCOUNTER — Other Ambulatory Visit: Payer: Self-pay

## 2021-11-04 DIAGNOSIS — I82723 Chronic embolism and thrombosis of deep veins of upper extremity, bilateral: Secondary | ICD-10-CM

## 2021-11-04 NOTE — Telephone Encounter (Signed)
Patient called asking about receiving a call Dr Lucky Cowboy. Referral sent to Dr. Lucky Cowboy as requested per Dr Janese Banks for varicose veins. Representative stated she will reach out the patient to schedule.Order in.

## 2021-11-18 ENCOUNTER — Other Ambulatory Visit (HOSPITAL_COMMUNITY): Payer: Self-pay

## 2021-11-19 ENCOUNTER — Other Ambulatory Visit (HOSPITAL_COMMUNITY): Payer: Self-pay

## 2021-11-19 ENCOUNTER — Other Ambulatory Visit: Payer: Self-pay

## 2021-11-19 ENCOUNTER — Other Ambulatory Visit: Payer: Self-pay | Admitting: *Deleted

## 2021-11-19 MED ORDER — APIXABAN 5 MG PO TABS
5.0000 mg | ORAL_TABLET | Freq: Two times a day (BID) | ORAL | 0 refills | Status: DC
  Filled 2021-11-19 (×2): qty 60, 30d supply, fill #0

## 2021-11-19 NOTE — Progress Notes (Signed)
Free 30 day trial

## 2021-11-28 ENCOUNTER — Other Ambulatory Visit (INDEPENDENT_AMBULATORY_CARE_PROVIDER_SITE_OTHER): Payer: Self-pay | Admitting: Vascular Surgery

## 2021-11-28 DIAGNOSIS — Z8679 Personal history of other diseases of the circulatory system: Secondary | ICD-10-CM

## 2021-11-28 DIAGNOSIS — I82723 Chronic embolism and thrombosis of deep veins of upper extremity, bilateral: Secondary | ICD-10-CM

## 2021-11-29 ENCOUNTER — Encounter (INDEPENDENT_AMBULATORY_CARE_PROVIDER_SITE_OTHER): Payer: Self-pay | Admitting: Vascular Surgery

## 2021-11-29 ENCOUNTER — Ambulatory Visit (INDEPENDENT_AMBULATORY_CARE_PROVIDER_SITE_OTHER): Payer: Medicare Other

## 2021-11-29 ENCOUNTER — Other Ambulatory Visit: Payer: Self-pay

## 2021-11-29 ENCOUNTER — Ambulatory Visit (INDEPENDENT_AMBULATORY_CARE_PROVIDER_SITE_OTHER): Payer: Medicare Other | Admitting: Vascular Surgery

## 2021-11-29 VITALS — BP 144/79 | HR 66 | Ht 61.0 in | Wt 137.0 lb

## 2021-11-29 DIAGNOSIS — I87009 Postthrombotic syndrome without complications of unspecified extremity: Secondary | ICD-10-CM | POA: Diagnosis not present

## 2021-11-29 DIAGNOSIS — I10 Essential (primary) hypertension: Secondary | ICD-10-CM

## 2021-11-29 DIAGNOSIS — M4310 Spondylolisthesis, site unspecified: Secondary | ICD-10-CM | POA: Insufficient documentation

## 2021-11-29 DIAGNOSIS — Z8679 Personal history of other diseases of the circulatory system: Secondary | ICD-10-CM | POA: Diagnosis not present

## 2021-11-29 DIAGNOSIS — I82723 Chronic embolism and thrombosis of deep veins of upper extremity, bilateral: Secondary | ICD-10-CM | POA: Diagnosis not present

## 2021-11-29 DIAGNOSIS — M5416 Radiculopathy, lumbar region: Secondary | ICD-10-CM | POA: Insufficient documentation

## 2021-11-29 DIAGNOSIS — E119 Type 2 diabetes mellitus without complications: Secondary | ICD-10-CM | POA: Diagnosis not present

## 2021-11-29 DIAGNOSIS — M7989 Other specified soft tissue disorders: Secondary | ICD-10-CM

## 2021-11-29 NOTE — Assessment & Plan Note (Signed)
blood glucose control important in reducing the progression of atherosclerotic disease. Also, involved in wound healing. On appropriate medications.  

## 2021-11-29 NOTE — Assessment & Plan Note (Signed)
From postphlebitic syndrome and previous DVT as well as decreased mobility after back surgery.

## 2021-11-29 NOTE — Progress Notes (Signed)
Patient ID: Chelsey [name], female   DOB: 11/19/1945, 76 y.o.   MRN: [id number]  Chief Complaint  Patient presents with   New Patient (Initial Visit)    Np consult and reflux     HPI Chelsey KiefDionne R Marner is a 76 y.o. female.  I am asked to see the patient by Dr. Terance HartBronstein for evaluation of lower extremity DVT and postphlebitic symptoms.  The patient had left lower extremity DVT about 3 months ago after back surgery.  She had previous right lower extremity DVT with chronic DVT finding seen on ultrasound at that time.  She does have some chronic leg swelling.  At this point, she still has a little more pain and swelling on the left than the right.  This has gradually improved but not resolved.  She is still in the recovery phase from her back and still in her back brace.  She denies any current chest pain or shortness of breath.  No fevers or chills.  Duplex today shows chronic appearing thrombus in the right popliteal and tibial veins and chronic appearing thrombus in the left tibial veins.     Past Medical History:  Diagnosis Date   Acute deep vein thrombosis (DVT) of both lower extremities (HCC)    Arthritis    Asthma    Blood clotting disorder (HCC)    Colon polyp 1994   Complication of anesthesia    nausea   Cystitis    Diabetes mellitus without complication (HCC)    no meds   Diffuse cystic mastopathy    Elevated liver enzymes 08/2021   Hypertension    PONV (postoperative nausea and vomiting)    in distant past    Past Surgical History:  Procedure Laterality Date   bladder prolapse surgery     x 2 - 2021, 2022   BREAST BIOPSY Bilateral    neg   BREAST CYST ASPIRATION Right    neg   BREAST LUMPECTOMY Right 1976   BREAST SURGERY Left 2002   excision   COLONOSCOPY  2011, 09/2013   COLONOSCOPY WITH PROPOFOL N/A 10/29/2018   Procedure: COLONOSCOPY WITH PROPOFOL;  Surgeon: Scot JunElliott, Robert T, MD;  [facility];  Service: Endoscopy;  Laterality: N/A;    FINGER ARTHROPLASTY Right 03/24/2018   Procedure: FINGER ARTHROPLASTY (CMC TENDON INTERPOSITIONAL ARTHROPLASTY);  Surgeon: Erin SonsKernodle, Harold, MD;  Location: [facility] ORS;  Service: Orthopedics;  Laterality: Right;   HAND SURGERY Left 04/2012   thumb   INCISION AND DRAINAGE HIP Right 06/17/2019   Procedure: right open gluteus medius repair, it band release, trochanteric bursectomy;  Surgeon: Signa KellPatel, Sunny, MD;  Location: [facility] ORS;  Service: Orthopedics;  Laterality: Right;   laparotomy     exploratory r/t liver   LASER ABLATION Bilateral 09/2014   Dr Wyn Quakerew   POLYPECTOMY  1994   SPINE SURGERY     TUBAL LIGATION  1976   VAGINAL HYSTERECTOMY  2021     Family History  Problem Relation Age of Onset   Cancer Mother    Hypertension Mother    Colon cancer Mother        colon   Cancer Father    Lung cancer Father        lung   Lung cancer Sister        lung   Pulmonary fibrosis Sister    Pulmonary fibrosis Brother    Diabetes Paternal Grandmother    Diabetes Paternal Grandfather    Breast cancer Neg Hx  Social History   Tobacco Use   Smoking status: Never    Passive exposure: Never   Smokeless tobacco: Never  Vaping Use   Vaping Use: Never used  Substance Use Topics   Alcohol use: No   Drug use: Never    Allergies  Allergen Reactions   Cefazolin Other (See Comments)    Too much bilirubin built up in liver (liver turned yellow)    Current Outpatient Medications  Medication Sig Dispense Refill   apixaban (ELIQUIS) 5 MG TABS tablet Take 1 tablet (5 mg total) by mouth 2 (two) times daily. 60 tablet 3   AZO-CRANBERRY PO Take 1 tablet by mouth in the morning and at bedtime.     calcium citrate-vitamin D (CITRACAL+D) 315-200 MG-UNIT tablet Take 1 tablet by mouth in the morning and at bedtime.     clotrimazole-betamethasone (LOTRISONE) cream APPLY  CREAM TOPICALLY TWICE DAILY 45 g 0   docusate sodium (COLACE) 100 MG capsule Take 200 mg by mouth at bedtime.     estradiol  (ESTRACE) 0.1 MG/GM vaginal cream Place 1 Applicatorful vaginally 2 (two) times a week. Mondays & Fridays.     losartan-hydrochlorothiazide (HYZAAR) 50-12.5 MG tablet Take 1 tablet by mouth in the morning.     Multiple Vitamin (MULTIVITAMIN WITH MINERALS) TABS tablet Take 1 tablet by mouth in the morning.     polyethylene glycol powder (GLYCOLAX/MIRALAX) 17 GM/SCOOP powder Take by mouth.     Propylene Glycol (SYSTANE COMPLETE OP) Place 1 drop into both eyes 2 (two) times daily as needed (dry/irritated eyes.).     solifenacin (VESICARE) 10 MG tablet Take 10 mg by mouth in the morning.     warfarin (COUMADIN) 5 MG tablet Take 5 mg by mouth daily. (Patient not taking: Reported on 11/29/2021)     Current Facility-Administered Medications  Medication Dose Route Frequency Provider Last Rate Last Admin   sulfamethoxazole-trimethoprim (BACTRIM DS) 800-160 MG per tablet 1 tablet  1 tablet Oral Q12H Hollice Espy, MD   1 tablet at 03/26/21 1538      REVIEW OF SYSTEMS (Negative unless checked)  Constitutional: [] Weight loss  [] Fever  [] Chills Cardiac: [] Chest pain   [] Chest pressure   [] Palpitations   [] Shortness of breath when laying flat   [] Shortness of breath at rest   [] Shortness of breath with exertion. Vascular:  [] Pain in legs with walking   [] Pain in legs at rest   [] Pain in legs when laying flat   [] Claudication   [] Pain in feet when walking  [] Pain in feet at rest  [] Pain in feet when laying flat   [x] History of DVT   [x] Phlebitis   [x] Swelling in legs   [] Varicose veins   [] Non-healing ulcers Pulmonary:   [] Uses home oxygen   [] Productive cough   [] Hemoptysis   [] Wheeze  [] COPD   [] Asthma Neurologic:  [] Dizziness  [] Blackouts   [] Seizures   [] History of stroke   [] History of TIA  [] Aphasia   [] Temporary blindness   [] Dysphagia   [] Weakness or numbness in arms   [] Weakness or numbness in legs Musculoskeletal:  [x] Arthritis   [] Joint swelling   [] Joint pain   [x] Low back pain Hematologic:   [] Easy bruising  [] Easy bleeding   [] Hypercoagulable state   [] Anemic  [] Hepatitis Gastrointestinal:  [] Blood in stool   [] Vomiting blood  [] Gastroesophageal reflux/heartburn   [] Abdominal pain Genitourinary:  [] Chronic kidney disease   [] Difficult urination  [] Frequent urination  [] Burning with urination   [] Hematuria Skin:  [] Rashes   []   Ulcers   [] Wounds Psychological:  [] History of anxiety   []  History of major depression.    Physical Exam BP (!) 144/79    Pulse 66    Ht 5\' 1"  (1.549 m)    Wt 137 lb (62.1 kg)    BMI 25.89 kg/m  Gen:  WD/WN, NAD Head: [place]/AT, No temporalis wasting.  Ear/Nose/Throat: Hearing grossly intact, nares w/o erythema or drainage, oropharynx w/o Erythema/Exudate Eyes: Conjunctiva clear, sclera non-icteric  Neck: trachea midline.  No JVD.  Pulmonary:  Good air movement, respirations not labored, no use of accessory muscles  Cardiac: RRR, no JVD Vascular:  Vessel Right Left  Radial Palpable Palpable                                   Gastrointestinal:. No masses, surgical incisions, or scars. Musculoskeletal: M/S 5/5 throughout.  Extremities without ischemic changes.  No deformity or atrophy.  Trace right lower extremity edema, 1+ left lower extremity edema. Neurologic: Sensation grossly intact in extremities.  Symmetrical.  Speech is fluent. Motor exam as listed above. Psychiatric: Judgment intact, Mood & affect appropriate for pt's clinical situation. Dermatologic: No rashes or ulcers noted.  No cellulitis or open wounds.    Radiology No results found.  Labs Recent Results (from the past 2160 hour(s))  Basic metabolic panel     Status: Abnormal   Collection Time: 09/04/21  3:44 PM  Result Value Ref Range   Sodium 136 135 - 145 mmol/L   Potassium 3.4 (L) 3.5 - 5.1 mmol/L   Chloride 100 98 - 111 mmol/L   CO2 29 22 - 32 mmol/L   Glucose, Bld 100 (H) 70 - 99 mg/dL    Comment: Glucose reference range applies only to samples taken after fasting for  at least 8 hours.   BUN 17 8 - 23 mg/dL   Creatinine, Ser 0.71 0.44 - 1.00 mg/dL   Calcium 9.6 8.9 - 10.3 mg/dL   GFR, Estimated >60 >60 mL/min    Comment: (NOTE) Calculated using the CKD-EPI Creatinine Equation (2021)    Anion gap 7 5 - 15    Comment: Performed at [facility] [address] [place], [place] [address]  CBC with Differential     Status: Abnormal   Collection Time: 09/04/21  3:44 PM  Result Value Ref Range   WBC 6.4 4.0 - 10.5 K/uL   RBC 2.95 (L) 3.87 - 5.11 MIL/uL   Hemoglobin 9.8 (L) 12.0 - 15.0 g/dL   HCT 29.2 (L) 36.0 - 46.0 %   MCV 99.0 80.0 - 100.0 fL   MCH 33.2 26.0 - 34.0 pg   MCHC 33.6 30.0 - 36.0 g/dL   RDW 13.3 11.5 - 15.5 %   Platelets 219 150 - 400 K/uL   nRBC 0.0 0.0 - 0.2 %   Neutrophils Relative % 64 %   Neutro Abs 4.2 1.7 - 7.7 K/uL   Lymphocytes Relative 20 %   Lymphs Abs 1.2 0.7 - 4.0 K/uL   Monocytes Relative 11 %   Monocytes Absolute 0.7 0.1 - 1.0 K/uL   Eosinophils Relative 3 %   Eosinophils Absolute 0.2 0.0 - 0.5 K/uL   Basophils Relative 1 %   Basophils Absolute 0.0 0.0 - 0.1 K/uL   Immature Granulocytes 1 %   Abs Immature Granulocytes 0.03 0.00 - 0.07 K/uL    Comment: Performed at [facility],  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  No acute DVT in either lower extremity.  She does have some postphlebitic symptoms little worse on the left than the right.  I would do 6 months of anticoagulation as she has now had bilateral DVTs and I would consider a prophylactic dose of Eliquis after the 6 months, but she is already seeing hematology who are more well versed than I am in the anticoagulation regimen so I will defer the duration of anticoagulation to them.  I have recommended compression socks and elevation for the postphlebitic symptoms.  I recommended  continuing to be active is much as possible.  I will see her back as needed at this point.  Hypertension blood pressure control important in reducing the progression of atherosclerotic disease. On appropriate oral medications.   Diabetes ([place]) blood glucose control important in reducing the progression of atherosclerotic disease. Also, involved in wound healing. On appropriate medications.   Swelling of lower limb From postphlebitic syndrome and previous DVT as well as decreased mobility after back surgery.      Leotis Pain 11/29/2021, 12:20 PM   This note was created with Dragon medical transcription system.  Any errors from dictation are unintentional.

## 2021-11-29 NOTE — Assessment & Plan Note (Signed)
blood pressure control important in reducing the progression of atherosclerotic disease. On appropriate oral medications.  

## 2021-11-29 NOTE — Assessment & Plan Note (Signed)
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  No acute DVT in either lower extremity.  She does have some postphlebitic symptoms little worse on the left than the right.  I would do 6 months of anticoagulation as she has now had bilateral DVTs and I would consider a prophylactic dose of Eliquis after the 6 months, but she is already seeing hematology who are more well versed than I am in the anticoagulation regimen so I will defer the duration of anticoagulation to them.  I have recommended compression socks and elevation for the postphlebitic symptoms.  I recommended continuing to be active is much as possible.  I will see her back as needed at this point.

## 2021-12-20 ENCOUNTER — Other Ambulatory Visit: Payer: Self-pay

## 2021-12-20 ENCOUNTER — Encounter: Payer: Self-pay | Admitting: Podiatry

## 2021-12-20 ENCOUNTER — Ambulatory Visit (INDEPENDENT_AMBULATORY_CARE_PROVIDER_SITE_OTHER): Payer: Medicare Other | Admitting: Podiatry

## 2021-12-20 DIAGNOSIS — B353 Tinea pedis: Secondary | ICD-10-CM

## 2021-12-20 MED ORDER — CLOTRIMAZOLE-BETAMETHASONE 1-0.05 % EX CREA: TOPICAL_CREAM | CUTANEOUS | 3 refills | Status: DC

## 2021-12-20 NOTE — Progress Notes (Signed)
? ?  HPI: 76 y.o. female presenting today for follow-up evaluation of athlete's foot bilateral.  Patient states that she does experience burning and itching if she does not use the Lotrisone cream.  She says it helped significantly.  She presents for further treatment and evaluation ? ? ?Past Medical History:  ?Diagnosis Date  ? Acute deep vein thrombosis (DVT) of both lower extremities (HCC)   ? Arthritis   ? Asthma   ? Blood clotting disorder ([place])   ? Colon polyp 1994  ? Complication of anesthesia   ? nausea  ? Cystitis   ? Diabetes mellitus without complication ([place])   ? no meds  ? Diffuse cystic mastopathy   ? Elevated liver enzymes 08/2021  ? Hypertension   ? PONV (postoperative nausea and vomiting)   ? in distant past  ? ?  ?Physical Exam: ?General: The patient is alert and oriented x3 in no acute distress. ? ?Dermatology: Pruritus noted to the bilateral feet with hyperkeratosis especially to the interdigital areas of the toes. Skin is warm, dry and supple bilateral lower extremities. Negative for open lesions or macerations. ? ?Vascular: Palpable pedal pulses bilaterally. No edema or erythema noted. Capillary refill within normal limits. ? ?Neurological: Epicritic and protective threshold grossly intact bilaterally.  ? ?Musculoskeletal Exam: No pedal deformities noted ? ?Assessment: ?1. Tinea pedis bilateral interdigital areas ? ?Plan of Care:  ?1. Patient evaluated.  ?2. Prescription for Lotrisone cream provided to patient.  ?3. Return to clinic as needed.  ? ?  ?  ?Edrick Kins, DPM ?[place] ? ?Dr. Edrick Kins, DPM  ?  ?[address]                                        ?[place], [place] [address]                ?Office [phone]  ?Fax [phone] ? ? ? ? ?

## 2022-01-29 ENCOUNTER — Encounter: Payer: Self-pay | Admitting: Oncology

## 2022-01-29 ENCOUNTER — Inpatient Hospital Stay (HOSPITAL_BASED_OUTPATIENT_CLINIC_OR_DEPARTMENT_OTHER): Payer: Medicare Other | Admitting: Oncology

## 2022-01-29 ENCOUNTER — Inpatient Hospital Stay: Payer: Medicare Other | Attending: Oncology

## 2022-01-29 VITALS — BP 130/67 | HR 56 | Temp 98.0°F | Resp 16 | Ht 61.0 in | Wt 138.6 lb

## 2022-01-29 DIAGNOSIS — R918 Other nonspecific abnormal finding of lung field: Secondary | ICD-10-CM | POA: Insufficient documentation

## 2022-01-29 DIAGNOSIS — I82503 Chronic embolism and thrombosis of unspecified deep veins of lower extremity, bilateral: Secondary | ICD-10-CM | POA: Insufficient documentation

## 2022-01-29 DIAGNOSIS — I82723 Chronic embolism and thrombosis of deep veins of upper extremity, bilateral: Secondary | ICD-10-CM

## 2022-01-29 LAB — CBC WITH DIFFERENTIAL/PLATELET
Abs Immature Granulocytes: 0.02 10*3/uL (ref 0.00–0.07)
Basophils Absolute: 0 10*3/uL (ref 0.0–0.1)
Basophils Relative: 1 %
Eosinophils Absolute: 0.1 10*3/uL (ref 0.0–0.5)
Eosinophils Relative: 1 %
HCT: 34.5 % — ABNORMAL LOW (ref 36.0–46.0)
Hemoglobin: 11.2 g/dL — ABNORMAL LOW (ref 12.0–15.0)
Immature Granulocytes: 0 %
Lymphocytes Relative: 26 %
Lymphs Abs: 1.4 10*3/uL (ref 0.7–4.0)
MCH: 29.2 pg (ref 26.0–34.0)
MCHC: 32.5 g/dL (ref 30.0–36.0)
MCV: 90.1 fL (ref 80.0–100.0)
Monocytes Absolute: 0.7 10*3/uL (ref 0.1–1.0)
Monocytes Relative: 13 %
Neutro Abs: 3.1 10*3/uL (ref 1.7–7.7)
Neutrophils Relative %: 59 %
Platelets: 184 10*3/uL (ref 150–400)
RBC: 3.83 MIL/uL — ABNORMAL LOW (ref 3.87–5.11)
RDW: 15.3 % (ref 11.5–15.5)
WBC: 5.3 10*3/uL (ref 4.0–10.5)
nRBC: 0 % (ref 0.0–0.2)

## 2022-01-29 LAB — D-DIMER, QUANTITATIVE: D-Dimer, Quant: <0.27 ug/mL-FEU (ref 0.00–0.50)

## 2022-01-29 LAB — COMPREHENSIVE METABOLIC PANEL
ALT: 23 U/L (ref 0–44)
AST: 26 U/L (ref 15–41)
Albumin: 4 g/dL (ref 3.5–5.0)
Alkaline Phosphatase: 75 U/L (ref 38–126)
Anion gap: 6 (ref 5–15)
BUN: 32 mg/dL — ABNORMAL HIGH (ref 8–23)
CO2: 29 mmol/L (ref 22–32)
Calcium: 9.9 mg/dL (ref 8.9–10.3)
Chloride: 100 mmol/L (ref 98–111)
Creatinine, Ser: 0.73 mg/dL (ref 0.44–1.00)
GFR, Estimated: >60 mL/min (ref >60)
Glucose, Bld: 96 mg/dL (ref 70–99)
Potassium: 4 mmol/L (ref 3.5–5.1)
Sodium: 135 mmol/L (ref 135–145)
Total Bilirubin: 1.3 mg/dL — ABNORMAL HIGH (ref 0.3–1.2)
Total Protein: 6.9 g/dL (ref 6.5–8.1)

## 2022-01-29 NOTE — Progress Notes (Signed)
? ? ? ?Hematology/Oncology Consult note ?[facility]  ?[phone] [phone] ? ?Patient Care Team: ?Dorothey Baseman, MD as PCP - General (Family Medicine) ?Dorothey Baseman, MD as Referring Physician (Family Medicine) ?Creig Hines, MD as Consulting Physician (Hematology and Oncology) ?Wynona Canes as Consulting Physician (Gastroenterology)  ? ?Name of the patient: Chelsey [name]  ?[id number]  ?October 11, 1946  ? ?Date of visit: 01/29/22 ? ?Diagnosis-history of bilateral lower extremity DVT provoked ? ?Chief complaint/ Reason for visit-discuss anticoagulation management ? ?Heme/Onc history: Patient is a 76 year old female with history of ongoing low back pain who underwent lumbar decompression and fusion surgery for scoliosis due to degenerative disease of the spine on 08/27/2021.  Shortly following that patient developed pain and swelling in her bilateral lower extremities on 09/04/2021.  Ultrasound showed acute nonocclusive DVT in the right popliteal vein and gastrocnemius and one of the paired right posterior tibial veins and occlusive DVT in the right peroneal veins.  Acute occlusive DVT involving the left peroneal veins.  CT angio chest did not show any evidence of PE.  Patient was initially started on Eliquis but patient found that cost prohibitive and was therefore switched to Coumadin.  Patient is presently on Coumadin 5 mg daily her most recent INR was 1.5119 2023.  She is here for further recommendations.  No prior history of DVT or PE.  No family history of thrombosis.  Back pain is improving since her back surgery.  She still has some lower extremity edema. 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 ?Interval history-patient will be completing 6 months of anticoagulation roughly in a month's time.  She was also seen by vascular surgery and had a repeat Doppler which did not show any acute DVT but  showed chronic DVT.  She was recommended compression stockings for her post phlebitis syndrome. ? ?ECOG PS- 1 ?Pain scale- 0 ? ? ?Review of systems- Review of Systems  ?Constitutional:  Positive for malaise/fatigue. Negative for chills, fever and weight loss.  ?HENT:  Negative for congestion, ear discharge and nosebleeds.   ?Eyes:  Negative for blurred vision.  ?Respiratory:  Negative for cough, hemoptysis, sputum production, shortness of breath and wheezing.   ?Cardiovascular:  Negative for chest pain, palpitations, orthopnea and claudication.  ?Gastrointestinal:  Negative for abdominal pain, blood in stool, constipation, diarrhea, heartburn, melena, nausea and vomiting.  ?Genitourinary:  Negative for dysuria, flank pain, frequency, hematuria and urgency.  ?Musculoskeletal:  Negative for back pain, joint pain and myalgias.  ?Skin:  Negative for rash.  ?Neurological:  Negative for dizziness, tingling, focal weakness, seizures, weakness and headaches.  ?Endo/Heme/Allergies:  Does not bruise/bleed easily.  ?Psychiatric/Behavioral:  Negative for depression and suicidal ideas. The patient does not have insomnia.    ? ? ? ?Allergies  ?Allergen Reactions  ? Cefazolin Other (See Comments)  ?  Too much bilirubin built up in liver (liver turned yellow)  ? ? ? ?Past Medical History:  ?Diagnosis Date  ? Acute deep vein thrombosis (DVT) of both lower extremities (HCC)   ? Arthritis   ? Asthma   ? Blood clotting disorder (HCC)   ? Colon polyp 1994  ? Complication of anesthesia   ? nausea  ? Cystitis   ? Diabetes mellitus without complication (HCC)   ? no meds  ? Diffuse cystic mastopathy   ? Elevated liver enzymes 08/2021  ? Hypertension   ? PONV (postoperative nausea and  vomiting)   ? in distant past  ? ? ? ?Past Surgical History:  ?Procedure Laterality Date  ? bladder prolapse surgery    ? x 2 - 2021, 2022  ? BREAST BIOPSY Bilateral   ? neg  ? BREAST CYST ASPIRATION Right   ? neg  ? BREAST LUMPECTOMY Right 1976  ? BREAST  SURGERY Left 2002  ? excision  ? COLONOSCOPY  2011, 09/2013  ? COLONOSCOPY WITH PROPOFOL N/A 10/29/2018  ? Procedure: COLONOSCOPY WITH PROPOFOL;  Surgeon: Scot Jun, MD;  Location: [facility] ENDOSCOPY;  Service: Endoscopy;  Laterality: N/A;  ? FINGER ARTHROPLASTY Right 03/24/2018  ? Procedure: FINGER ARTHROPLASTY (CMC TENDON INTERPOSITIONAL ARTHROPLASTY);  Surgeon: Erin Sons, MD;  Location: [facility] ORS;  Service: Orthopedics;  Laterality: Right;  ? HAND SURGERY Left 04/2012  ? thumb  ? INCISION AND DRAINAGE HIP Right 06/17/2019  ? Procedure: right open gluteus medius repair, it band release, trochanteric bursectomy;  Surgeon: Signa Kell, MD;  Location: [facility] ORS;  Service: Orthopedics;  Laterality: Right;  ? laparotomy    ? exploratory r/t liver  ? LASER ABLATION Bilateral 09/2014  ? Dr Wyn Quaker  ? POLYPECTOMY  1994  ? SPINE SURGERY    ? TUBAL LIGATION  1976  ? VAGINAL HYSTERECTOMY  2021  ? ? ?Social History  ? ?Socioeconomic History  ? Marital status: Married  ?  Spouse name: Not on file  ? Number of children: Not on file  ? Years of education: Not on file  ? Highest education level: Not on file  ?Occupational History  ? Not on file  ?Tobacco Use  ? Smoking status: Never  ?  Passive exposure: Never  ? Smokeless tobacco: Never  ?Vaping Use  ? Vaping Use: Never used  ?Substance and Sexual Activity  ? Alcohol use: No  ? Drug use: Never  ? Sexual activity: Not Currently  ?  Birth control/protection: Post-menopausal, Surgical  ?  Comment: Hysterectomy  ?Other Topics Concern  ? Not on file  ?Social History Narrative  ? Not on file  ? ?Social Determinants of Health  ? ?Financial Resource Strain: Not on file  ?Food Insecurity: Not on file  ?Transportation Needs: Not on file  ?Physical Activity: Not on file  ?Stress: Not on file  ?Social Connections: Not on file  ?Intimate Partner Violence: Not on file  ? ? ?Family History  ?Problem Relation Age of Onset  ? Cancer Mother   ? Hypertension Mother   ? Colon cancer Mother    ?     colon  ? Cancer Father   ? Lung cancer Father   ?     lung  ? Lung cancer Sister   ?     lung  ? Pulmonary fibrosis Sister   ? Pulmonary fibrosis Brother   ? Diabetes Paternal Grandmother   ? Diabetes Paternal Grandfather   ? Breast cancer Neg Hx   ? ? ? ?Current Outpatient Medications:  ?  apixaban (ELIQUIS) 5 MG TABS tablet, Take 1 tablet (5 mg total) by mouth 2 (two) times daily., Disp: 60 tablet, Rfl: 3 ?  AZO-CRANBERRY PO, Take 1 tablet by mouth daily., Disp: , Rfl:  ?  calcium citrate-vitamin D (CITRACAL+D) 315-200 MG-UNIT tablet, Take 1 tablet by mouth daily., Disp: , Rfl:  ?  clotrimazole-betamethasone (LOTRISONE) cream, APPLY  CREAM TOPICALLY TWICE DAILY, Disp: 45 g, Rfl: 3 ?  docusate sodium (COLACE) 100 MG capsule, Take 200 mg by mouth at bedtime., Disp: , Rfl:  ?  estradiol (ESTRACE) 0.1 MG/GM vaginal cream, Place 1 Applicatorful vaginally 2 (two) times a week. Mondays & Fridays., Disp: , Rfl:  ?  losartan-hydrochlorothiazide (HYZAAR) 50-12.5 MG tablet, Take 1 tablet by mouth in the morning., Disp: , Rfl:  ?  Multiple Vitamin (MULTIVITAMIN WITH MINERALS) TABS tablet, Take 1 tablet by mouth in the morning., Disp: , Rfl:  ?  polyethylene glycol powder (GLYCOLAX/MIRALAX) 17 GM/SCOOP powder, Take 0.5 Containers by mouth. Pt is suppose to take 1/2 dose in am and 1/2 dose in pm, Disp: , Rfl:  ?  Propylene Glycol (SYSTANE COMPLETE OP), Place 1 drop into both eyes 2 (two) times daily as needed (dry/irritated eyes.)., Disp: , Rfl:  ?  solifenacin (VESICARE) 10 MG tablet, Take 10 mg by mouth in the morning., Disp: , Rfl:  ? ?Physical exam:  ?Vitals:  ? 01/29/22 1125 01/29/22 1126  ?BP:  130/67  ?Pulse:  (!) 56  ?Resp: 16   ?Temp: 98 ?F (36.7 ?C)   ?TempSrc: Oral   ?Weight: 138 lb 9.6 oz (62.9 kg)   ?Height: 5\' 1"  (1.549 m)   ? ?Physical Exam ?Constitutional:   ?   General: She is not in acute distress. ?Cardiovascular:  ?   Rate and Rhythm: Normal rate and regular rhythm.  ?   Heart sounds: Normal heart  sounds.  ?Pulmonary:  ?   Effort: Pulmonary effort is normal.  ?   Breath sounds: Normal breath sounds.  ?Abdominal:  ?   General: Bowel sounds are normal.  ?   Palpations: Abdomen is soft.  ?Musculoskeletal:

## 2022-01-29 NOTE — Progress Notes (Signed)
Pt doing ok from a DVT stance- no pain  ?

## 2022-03-20 ENCOUNTER — Other Ambulatory Visit: Payer: Self-pay | Admitting: Orthopedic Surgery

## 2022-03-20 DIAGNOSIS — M25551 Pain in right hip: Secondary | ICD-10-CM

## 2022-04-03 ENCOUNTER — Ambulatory Visit
Admission: RE | Admit: 2022-04-03 | Discharge: 2022-04-03 | Disposition: A | Discharge status: Home / Self Care | Payer: Medicare Other | Source: Ambulatory Visit | Attending: Orthopedic Surgery | Admitting: Orthopedic Surgery

## 2022-04-03 DIAGNOSIS — M25551 Pain in right hip: Secondary | ICD-10-CM | POA: Insufficient documentation

## 2022-04-04 ENCOUNTER — Ambulatory Visit
Admission: RE | Admit: 2022-04-04 | Discharge: 2022-04-04 | Disposition: A | Discharge status: Home / Self Care | Payer: Medicare Other | Source: Ambulatory Visit | Attending: Oncology | Admitting: Oncology

## 2022-04-04 DIAGNOSIS — R918 Other nonspecific abnormal finding of lung field: Secondary | ICD-10-CM | POA: Diagnosis present

## 2022-04-07 ENCOUNTER — Other Ambulatory Visit: Payer: Medicare Other

## 2022-04-09 ENCOUNTER — Inpatient Hospital Stay: Payer: Medicare Other | Attending: Oncology | Admitting: Oncology

## 2022-04-09 ENCOUNTER — Encounter: Payer: Self-pay | Admitting: Oncology

## 2022-04-09 VITALS — BP 145/71 | HR 65 | Temp 97.9°F | Resp 16 | Wt 140.0 lb

## 2022-04-09 DIAGNOSIS — Z8719 Personal history of other diseases of the digestive system: Secondary | ICD-10-CM | POA: Diagnosis not present

## 2022-04-09 DIAGNOSIS — I82563 Chronic embolism and thrombosis of calf muscular vein, bilateral: Secondary | ICD-10-CM | POA: Diagnosis present

## 2022-04-09 DIAGNOSIS — Z79899 Other long term (current) drug therapy: Secondary | ICD-10-CM | POA: Insufficient documentation

## 2022-04-09 DIAGNOSIS — R918 Other nonspecific abnormal finding of lung field: Secondary | ICD-10-CM | POA: Diagnosis not present

## 2022-04-09 NOTE — Progress Notes (Signed)
Hematology/Oncology Consult note [facility]  Telephone:([phone] Fax:([phone]  Patient Care Team: Dorothey Baseman, MD as PCP - General (Family Medicine) Dorothey Baseman, MD as Referring Physician (Family Medicine) Creig Hines, MD as Consulting Physician (Hematology and Oncology) Wynona Canes as Consulting Physician (Gastroenterology)   Name of the patient: Chelsey [name]  [id number]  07-22-1946   Date of visit: 04/09/22  Diagnosis- history of bilateral lower extremity DVT provoked History of lung nodules  Chief complaint/ Reason for visit-discuss CT scan results and further management  Heme/Onc history: Patient is a 76 year old female with history of ongoing low back pain who underwent lumbar decompression and fusion surgery for scoliosis due to degenerative disease of the spine on 08/27/2021.  Shortly following that patient developed pain and swelling in her bilateral lower extremities on 09/04/2021.  Ultrasound showed acute nonocclusive DVT in the right popliteal vein and gastrocnemius and one of the paired right posterior tibial veins and occlusive DVT in the right peroneal veins.  Acute occlusive DVT involving the left peroneal veins.  CT angio chest did not show any evidence of PE.  Patient was initially started on Eliquis but patient found that cost prohibitive and was therefore switched to Coumadin.  Patient is presently on Coumadin 5 mg daily her most recent INR was 1.5119 2023.  She is here for further recommendations.  No prior history of DVT or PE.  No family history of thrombosis.  Back pain is improving since her back surgery.  She still has some lower extremity edema. 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  After 6 months of anticoagulation patient had another D-dimer checked which was 0.27.  Pros and cons of continuing versus discontinuing Eliquis was discussed  with the patient especially given that she had some post phlebitis syndrome and was seen by vascular surgery.  Given the cost of the medication patient decided to come off Eliquis.    Interval history-patient will be completing her Eliquis prescription this month.  She is unable to afford to continue Eliquis at this time and would like to stop it if she cannot get any financial assistance for the same.  ECOG PS- 1 Pain scale- 0   Review of systems- Review of Systems  Constitutional:  Positive for malaise/fatigue. Negative for chills, fever and weight loss.  HENT:  Negative for congestion, ear discharge and nosebleeds.   Eyes:  Negative for blurred vision.  Respiratory:  Negative for cough, hemoptysis, sputum production, shortness of breath and wheezing.   Cardiovascular:  Negative for chest pain, palpitations, orthopnea and claudication.  Gastrointestinal:  Negative for abdominal pain, blood in stool, constipation, diarrhea, heartburn, melena, nausea and vomiting.  Genitourinary:  Negative for dysuria, flank pain, frequency, hematuria and urgency.  Musculoskeletal:  Negative for back pain, joint pain and myalgias.  Skin:  Negative for rash.  Neurological:  Negative for dizziness, tingling, focal weakness, seizures, weakness and headaches.  Endo/Heme/Allergies:  Does not bruise/bleed easily.  Psychiatric/Behavioral:  Negative for depression and suicidal ideas. The patient does not have insomnia.       Allergies  Allergen Reactions   Cefazolin Other (See Comments)    Too much bilirubin built up in liver (liver turned yellow)     Past Medical History:  Diagnosis Date   Acute deep vein thrombosis (DVT) of both lower extremities (HCC)    Arthritis    Asthma    Blood  clotting disorder (HCC)    Colon polyp [id number]   Complication of anesthesia    nausea   Cystitis    Diabetes mellitus without complication ([place])    no meds   Diffuse cystic mastopathy    Elevated liver enzymes 08/2021    Hypertension    PONV (postoperative nausea and vomiting)    in distant past     Past Surgical History:  Procedure Laterality Date   bladder prolapse surgery     x 2 - 2021, 2022   BREAST BIOPSY Bilateral    neg   BREAST CYST ASPIRATION Right    neg   BREAST LUMPECTOMY Right 1976   BREAST SURGERY Left 2002   excision   COLONOSCOPY  2011, 09/2013   COLONOSCOPY WITH PROPOFOL N/A 10/29/2018   Procedure: COLONOSCOPY WITH PROPOFOL;  Surgeon: Manya Silvas, MD;  Location: [facility] ENDOSCOPY;  Service: Endoscopy;  Laterality: N/A;   FINGER ARTHROPLASTY Right 03/24/2018   Procedure: FINGER ARTHROPLASTY (CMC TENDON INTERPOSITIONAL ARTHROPLASTY);  Surgeon: Leanor Kail, MD;  Location: [facility] ORS;  Service: Orthopedics;  Laterality: Right;   HAND SURGERY Left 04/2012   thumb   INCISION AND DRAINAGE HIP Right 06/17/2019   Procedure: right open gluteus medius repair, it band release, trochanteric bursectomy;  Surgeon: Leim Fabry, MD;  Location: [facility] ORS;  Service: Orthopedics;  Laterality: Right;   laparotomy     exploratory r/t liver   LASER ABLATION Bilateral 09/2014   Dr Lucky Cowboy   POLYPECTOMY  1994   SPINE SURGERY     TUBAL LIGATION  1976   VAGINAL HYSTERECTOMY  2021    Social History   Socioeconomic History   Marital status: Married    Spouse name: Not on file   Number of children: Not on file   Years of education: Not on file   Highest education level: Not on file  Occupational History   Not on file  Tobacco Use   Smoking status: Never    Passive exposure: Never   Smokeless tobacco: Never  Vaping Use   Vaping Use: Never used  Substance and Sexual Activity   Alcohol use: No   Drug use: Never   Sexual activity: Not Currently    Birth control/protection: Post-menopausal, Surgical    Comment: Hysterectomy  Other Topics Concern   Not on file  Social History Narrative   Not on file   Social Determinants of Health   Financial Resource Strain: Not on file  Food  Insecurity: Not on file  Transportation Needs: Not on file  Physical Activity: Not on file  Stress: Not on file  Social Connections: Not on file  Intimate Partner Violence: Not on file    Family History  Problem Relation Age of Onset   Cancer Mother    Hypertension Mother    Colon cancer Mother        colon   Cancer Father    Lung cancer Father        lung   Lung cancer Sister        lung   Pulmonary fibrosis Sister    Pulmonary fibrosis Brother    Diabetes Paternal Grandmother    Diabetes Paternal Grandfather    Breast cancer Neg Hx      Current Outpatient Medications:    apixaban (ELIQUIS) 5 MG TABS tablet, Take 1 tablet (5 mg total) by mouth 2 (two) times daily., Disp: 60 tablet, Rfl: 3   AZO-CRANBERRY PO, Take 1 tablet by mouth daily., Disp: ,  Rfl:    calcium citrate-vitamin D (CITRACAL+D) 315-200 MG-UNIT tablet, Take 1 tablet by mouth daily., Disp: , Rfl:    clotrimazole-betamethasone (LOTRISONE) cream, APPLY  CREAM TOPICALLY TWICE DAILY, Disp: 45 g, Rfl: 3   docusate sodium (COLACE) 100 MG capsule, Take 200 mg by mouth at bedtime., Disp: , Rfl:    losartan-hydrochlorothiazide (HYZAAR) 50-12.5 MG tablet, Take 1 tablet by mouth in the morning., Disp: , Rfl:    Multiple Vitamin (MULTIVITAMIN WITH MINERALS) TABS tablet, Take 1 tablet by mouth in the morning., Disp: , Rfl:    polyethylene glycol powder (GLYCOLAX/MIRALAX) 17 GM/SCOOP powder, Take 0.5 Containers by mouth. Pt is suppose to take 1/2 dose in am and 1/2 dose in pm, Disp: , Rfl:    Propylene Glycol (SYSTANE COMPLETE OP), Place 1 drop into both eyes 2 (two) times daily as needed (dry/irritated eyes.)., Disp: , Rfl:    solifenacin (VESICARE) 10 MG tablet, Take 10 mg by mouth in the morning., Disp: , Rfl:   Physical exam:  Vitals:   04/09/22 1455  BP: (!) 145/71  Pulse: 65  Resp: 16  Temp: 97.9 F (36.6 C)  TempSrc: Tympanic  SpO2: 100%  Weight: 140 lb (63.5 kg)   Physical Exam Constitutional:       General: She is not in acute distress. Cardiovascular:     Rate and Rhythm: Normal rate and regular rhythm.     Heart sounds: Normal heart sounds.  Pulmonary:     Effort: Pulmonary effort is normal.     Breath sounds: Normal breath sounds.  Abdominal:     General: Bowel sounds are normal.     Palpations: Abdomen is soft.  Skin:    General: Skin is warm and dry.  Neurological:     Mental Status: She is alert and oriented to person, place, and time.         Latest Ref Rng & Units 01/29/2022   10:52 AM  CMP  Glucose 70 - 99 mg/dL 96   BUN 8 - 23 mg/dL 32   Creatinine 0.44 - 1.00 mg/dL 0.73   Sodium 135 - 145 mmol/L 135   Potassium 3.5 - 5.1 mmol/L 4.0   Chloride 98 - 111 mmol/L 100   CO2 22 - 32 mmol/L 29   Calcium 8.9 - 10.3 mg/dL 9.9   Total Protein 6.5 - 8.1 g/dL 6.9   Total Bilirubin 0.3 - 1.2 mg/dL 1.3   Alkaline Phos 38 - 126 U/L 75   AST 15 - 41 U/L 26   ALT 0 - 44 U/L 23       Latest Ref Rng & Units 01/29/2022   10:52 AM  CBC  WBC 4.0 - 10.5 K/uL 5.3   Hemoglobin 12.0 - 15.0 g/dL 11.2   Hematocrit 36.0 - 46.0 % 34.5   Platelets 150 - 400 K/uL 184     No images are attached to the encounter.  CT Chest Wo Contrast  Result Date: 04/05/2022 CLINICAL DATA:  Pulmonary nodules EXAM: CT CHEST WITHOUT CONTRAST TECHNIQUE: Multidetector CT imaging of the chest was performed following the standard protocol without IV contrast. RADIATION DOSE REDUCTION: This exam was performed according to the departmental dose-optimization program which includes automated exposure control, adjustment of the mA and/or kV according to patient size and/or use of iterative reconstruction technique. COMPARISON:  09/04/2021 FINDINGS: Cardiovascular: Aortic atherosclerosis. Normal heart size. Scattered left and right coronary artery calcifications. No pericardial effusion. Mediastinum/Nodes: No enlarged mediastinal, hilar, or axillary lymph  nodes. Thyroid gland, trachea, and esophagus demonstrate no  significant findings. Lungs/Pleura: Clustered nodularity in the posterior left upper lobe is diminished compared to prior examination, largest nodule centrally measuring 0.4 cm, previously 0.7 cm (series 3, image 41). Unchanged mild scarring and or atelectasis of the left lung base with scattered bronchiolar plugging throughout. No pleural effusion or pneumothorax. Upper Abdomen: No acute abnormality. Coarse, nodular contour of the liver in the included upper abdomen. Musculoskeletal: No chest wall abnormality. No suspicious osseous lesions identified. IMPRESSION: 1. Clustered nodularity in the posterior left upper lobe is diminished compared to prior examination, consistent with nonspecific resolving infection or inflammation. No further follow-up or characterization required. 2. Coarse, nodular contour of the liver in the included upper abdomen, suggestive of cirrhosis. 3. Coronary artery disease. Aortic Atherosclerosis (ICD10-I70.0). Electronically Signed   By: Jearld Lesch M.D.   On: 04/05/2022 20:26   MR HIP RIGHT WO CONTRAST  Result Date: 04/04/2022 CLINICAL DATA:  Right hip pain, no known injury EXAM: MR OF THE RIGHT HIP WITHOUT CONTRAST TECHNIQUE: Multiplanar, multisequence MR imaging was performed. No intravenous contrast was administered. COMPARISON:  None Available. FINDINGS: Bones: No hip fracture, dislocation or avascular necrosis. No periosteal reaction or bone destruction. No aggressive osseous lesion. 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 No SI joint widening or erosive changes. Posterior lumbar interbody fusion partially visualized from L3 through L5. Articular cartilage and labrum Articular cartilage: Mild chondral thinning of bilateral hips without a focal chondral defect. Labrum:  Right superior labral degeneration. Joint or bursal effusion Joint effusion:  No hip joint effusion.  No SI joint effusion. Bursae:  No bursa formation. Muscles and tendons Flexors: Normal. Extensors:  Normal. Abductors: Normal. Adductors: Normal. Gluteals: Normal. Hamstrings: Normal. Other findings No pelvic free fluid. No fluid collection or hematoma. No inguinal lymphadenopathy. No inguinal hernia. IMPRESSION: 1. No hip fracture, dislocation or avascular necrosis. 2. Right superior labral degeneration. 3. 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 Electronically Signed   By: Elige Ko M.D.   On: 04/04/2022 09:08     Assessment and plan- Patient is a 76 y.o. female who is here for follow-up of following issues:  Chronic bilateral lower extremity DVT: This was provoked in the setting of back surgery.  Her repeat D-dimer levels were low.  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  It would have been okay to continue 2.5 mg Eliquis twice daily given her phlebitis however patient cannot afford Eliquis at this time with co-pay.  She is therefore going to come off Eliquis and we will monitor her off anticoagulation at this time.  Patient had a CT chest to follow-up on her lung nodules which appear to have decreased in the posterior left upper lobe and no further follow-up was recommended.  However CT chest did incidentally show evidence of cirrhosis.  I am therefore referring her to GI for the same   Visit Diagnosis 1. 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 Lung nodules      Dr. Owens Shark, MD, MPH [facility] at [facility] [phone] 04/09/2022 2:56 PM

## 2022-04-09 NOTE — Progress Notes (Signed)
Pt in for follow up, denies any concerns today. 

## 2022-04-10 ENCOUNTER — Telehealth: Payer: Self-pay | Admitting: *Deleted

## 2022-04-10 NOTE — Telephone Encounter (Signed)
Patient called stating that Dr Smith Robert was going to refer her to GI and she would like to be referred back to Dr Donne Hazel at [facility]

## 2022-04-11 NOTE — Telephone Encounter (Signed)
Will do, no problem!

## 2022-04-11 NOTE — Telephone Encounter (Signed)
Chelsey [name] can you do this? Not [facility]. Thanks. I wll do the note in the next couple fo days

## 2022-04-14 ENCOUNTER — Encounter: Payer: Self-pay | Admitting: Oncology

## 2022-04-16 ENCOUNTER — Telehealth: Payer: Self-pay

## 2022-04-16 NOTE — Telephone Encounter (Signed)
Referral for hx of ciirhosis to Dr. Ardelle Lesches at [facility] has been faxed and recieved a fax confirmation. Pt is already established with Dr. Waynard Reeds so she is being referred back. Pt will be on vacation from July 8-15 and wrote note on fax cover sheet.

## 2022-04-24 ENCOUNTER — Telehealth: Payer: Self-pay

## 2022-04-24 NOTE — Telephone Encounter (Signed)
Reached out to Dr. Winferd Humphrey at [phone]  to check on referral status for hx of cirrhosis. Patient wanted to be referred back to Dr. Waynard Reeds. Per [facility];   pt is already established and was last seen in 12/2020. Informed me to let pt know to call their office and scheudle an appointment. Reached out to pt and made her aware to contact facility in order to scheudule an appt and provide their office number. Pt verbalized understanding and agrees.

## 2022-04-30 ENCOUNTER — Telehealth: Payer: Self-pay

## 2022-04-30 NOTE — Telephone Encounter (Signed)
Checking on pts referral to Dr. Waynard Reeds office for hx of cirrhosis. Per care everywhere pt has been scheduled for 05/07/22.

## 2022-08-04 ENCOUNTER — Other Ambulatory Visit: Payer: Self-pay | Admitting: Family Medicine

## 2022-08-04 DIAGNOSIS — Z1231 Encounter for screening mammogram for malignant neoplasm of breast: Secondary | ICD-10-CM

## 2022-08-06 ENCOUNTER — Inpatient Hospital Stay: Payer: Medicare Other

## 2022-08-06 ENCOUNTER — Inpatient Hospital Stay: Payer: Medicare Other | Admitting: Oncology

## 2022-08-11 ENCOUNTER — Encounter (INDEPENDENT_AMBULATORY_CARE_PROVIDER_SITE_OTHER): Payer: Self-pay

## 2022-08-25 ENCOUNTER — Other Ambulatory Visit: Payer: Self-pay

## 2022-08-25 DIAGNOSIS — I82563 Chronic embolism and thrombosis of calf muscular vein, bilateral: Secondary | ICD-10-CM

## 2022-08-26 ENCOUNTER — Encounter: Payer: Self-pay | Admitting: Oncology

## 2022-08-26 ENCOUNTER — Inpatient Hospital Stay (HOSPITAL_BASED_OUTPATIENT_CLINIC_OR_DEPARTMENT_OTHER): Payer: Medicare Other | Admitting: Oncology

## 2022-08-26 ENCOUNTER — Inpatient Hospital Stay: Payer: Medicare Other | Attending: Oncology

## 2022-08-26 VITALS — BP 124/60 | HR 72 | Temp 97.4°F | Resp 16 | Wt 145.9 lb

## 2022-08-26 DIAGNOSIS — Z79899 Other long term (current) drug therapy: Secondary | ICD-10-CM | POA: Insufficient documentation

## 2022-08-26 DIAGNOSIS — I82403 Acute embolism and thrombosis of unspecified deep veins of lower extremity, bilateral: Secondary | ICD-10-CM | POA: Diagnosis not present

## 2022-08-26 DIAGNOSIS — R609 Edema, unspecified: Secondary | ICD-10-CM | POA: Diagnosis not present

## 2022-08-26 DIAGNOSIS — Z86718 Personal history of other venous thrombosis and embolism: Secondary | ICD-10-CM | POA: Diagnosis not present

## 2022-08-26 DIAGNOSIS — I82563 Chronic embolism and thrombosis of calf muscular vein, bilateral: Secondary | ICD-10-CM

## 2022-08-26 LAB — CBC WITH DIFFERENTIAL/PLATELET
Abs Immature Granulocytes: 0.02 10*3/uL (ref 0.00–0.07)
Basophils Absolute: 0 10*3/uL (ref 0.0–0.1)
Basophils Relative: 1 %
Eosinophils Absolute: 0.1 10*3/uL (ref 0.0–0.5)
Eosinophils Relative: 1 %
HCT: 38 % (ref 36.0–46.0)
Hemoglobin: 12.4 g/dL (ref 12.0–15.0)
Immature Granulocytes: 0 %
Lymphocytes Relative: 25 %
Lymphs Abs: 1.6 10*3/uL (ref 0.7–4.0)
MCH: 30 pg (ref 26.0–34.0)
MCHC: 32.6 g/dL (ref 30.0–36.0)
MCV: 91.8 fL (ref 80.0–100.0)
Monocytes Absolute: 0.7 10*3/uL (ref 0.1–1.0)
Monocytes Relative: 11 %
Neutro Abs: 3.8 10*3/uL (ref 1.7–7.7)
Neutrophils Relative %: 62 %
Platelets: 180 10*3/uL (ref 150–400)
RBC: 4.14 MIL/uL (ref 3.87–5.11)
RDW: 13.6 % (ref 11.5–15.5)
WBC: 6.2 10*3/uL (ref 4.0–10.5)
nRBC: 0 % (ref 0.0–0.2)

## 2022-08-26 NOTE — Progress Notes (Signed)
Hematology/Oncology Consult note [facility]  [phone] [phone]  Patient Care Team: Dorothey BasemanBronstein, David, MD as PCP - General (Family Medicine) Dorothey BasemanBronstein, David, MD as Referring Physician (Family Medicine) Creig Hinesao, Jabree Rebert C, MD as Consulting Physician (Hematology and Oncology) Wynona CanesWoodard, Janice B, PA-C as Consulting Physician (Gastroenterology)   Name of the patient: Chelsey [name]  [id number]  December 03, 1945   Date of visit: 08/26/22  Diagnosis-  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  Shortly following that patient developed pain and swelling in her bilateral lower extremities on 09/04/2021.  Ultrasound showed acute nonocclusive DVT in the right popliteal vein and gastrocnemius and one of the paired right posterior tibial veins and occlusive DVT in the right peroneal veins.  Acute occlusive DVT involving the left peroneal veins.  CT angio chest did not show any evidence of PE.  Patient was initially started on Eliquis but patient found that cost prohibitive and was therefore switched to Coumadin.  Patient is presently on Coumadin 5 mg daily her most recent INR was 1.5119 2023.  She is here for further recommendations.  No prior history of DVT or PE.  No family history of thrombosis.  Back pain is improving since her back surgery.  She still has some lower extremity edema. 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   After 6 months of anticoagulation patient had another D-dimer checked which was 0.27.  Pros and cons of continuing versus discontinuing Eliquis was  discussed with the patient especially given that she had some post phlebitis syndrome and was seen by vascular surgery.  Given the cost of the medication patient decided to come off Eliquis.    Interval history-patient is not currently on Eliquis and has not had any episodes of bilateral lower extremity swelling or pain.  Denies any exertional shortness of breath.  ECOG PS- 1 Pain scale- 0   Review of systems- Review of Systems  Constitutional:  Negative for chills, fever, malaise/fatigue and weight loss.  HENT:  Negative for congestion, ear discharge and nosebleeds.   Eyes:  Negative for blurred vision.  Respiratory:  Negative for cough, hemoptysis, sputum production, shortness of breath and wheezing.   Cardiovascular:  Negative for chest pain, palpitations, orthopnea and claudication.  Gastrointestinal:  Negative for abdominal pain, blood in stool, constipation, diarrhea, heartburn, melena, nausea and vomiting.  Genitourinary:  Negative for dysuria, flank pain, frequency, hematuria and urgency.  Musculoskeletal:  Negative for back pain, joint pain and myalgias.  Skin:  Negative for rash.  Neurological:  Negative for dizziness, tingling, focal weakness, seizures, weakness and headaches.  Endo/Heme/Allergies:  Does not bruise/bleed easily.  Psychiatric/Behavioral:  Negative for depression and suicidal ideas. The patient does not have insomnia.       Allergies  Allergen Reactions   Cefazolin Other (See Comments)    Too much bilirubin built up in liver (liver turned yellow)     Past Medical History:  Diagnosis Date   Acute deep vein thrombosis (DVT) of both lower extremities (HCC)    Arthritis    Asthma    Blood clotting disorder (HCC)    Colon polyp 1994  Complication of anesthesia    nausea   Cystitis    Diabetes mellitus without complication (HCC)    no meds   Diffuse cystic mastopathy    Elevated liver enzymes 08/2021   Hypertension    PONV (postoperative nausea and  vomiting)    in distant past     Past Surgical History:  Procedure Laterality Date   bladder prolapse surgery     x 2 - 2021, 2022   BREAST BIOPSY Bilateral    neg   BREAST CYST ASPIRATION Right    neg   BREAST LUMPECTOMY Right 1976   BREAST SURGERY Left 2002   excision   COLONOSCOPY  2011, 09/2013   COLONOSCOPY WITH PROPOFOL N/A 10/29/2018   Procedure: COLONOSCOPY WITH PROPOFOL;  Surgeon: Scot Jun, MD;  Location: [facility] ENDOSCOPY;  Service: Endoscopy;  Laterality: N/A;   FINGER ARTHROPLASTY Right 03/24/2018   Procedure: FINGER ARTHROPLASTY (CMC TENDON INTERPOSITIONAL ARTHROPLASTY);  Surgeon: Erin Sons, MD;  Location: [facility] ORS;  Service: Orthopedics;  Laterality: Right;   HAND SURGERY Left 04/2012   thumb   INCISION AND DRAINAGE HIP Right 06/17/2019   Procedure: right open gluteus medius repair, it band release, trochanteric bursectomy;  Surgeon: Signa Kell, MD;  Location: [facility] ORS;  Service: Orthopedics;  Laterality: Right;   laparotomy     exploratory r/t liver   LASER ABLATION Bilateral 09/2014   Dr Wyn Quaker   POLYPECTOMY  1994   SPINE SURGERY     TUBAL LIGATION  1976   VAGINAL HYSTERECTOMY  2021    Social History   Socioeconomic History   Marital status: Married    Spouse name: Not on file   Number of children: Not on file   Years of education: Not on file   Highest education level: Not on file  Occupational History   Not on file  Tobacco Use   Smoking status: Never    Passive exposure: Never   Smokeless tobacco: Never  Vaping Use   Vaping Use: Never used  Substance and Sexual Activity   Alcohol use: No   Drug use: Never   Sexual activity: Not Currently    Birth control/protection: Post-menopausal, Surgical    Comment: Hysterectomy  Other Topics Concern   Not on file  Social History Narrative   Not on file   Social Determinants of Health   Financial Resource Strain: Not on file  Food Insecurity: Not on file  Transportation Needs: Not on  file  Physical Activity: Not on file  Stress: Not on file  Social Connections: Not on file  Intimate Partner Violence: Not on file    Family History  Problem Relation Age of Onset   Cancer Mother    Hypertension Mother    Colon cancer Mother        colon   Cancer Father    Lung cancer Father        lung   Lung cancer Sister        lung   Pulmonary fibrosis Sister    Pulmonary fibrosis Brother    Diabetes Paternal Grandmother    Diabetes Paternal Grandfather    Breast cancer Neg Hx      Current Outpatient Medications:    AZO-CRANBERRY PO, Take 1 tablet by mouth daily., Disp: , Rfl:    calcium citrate-vitamin D (CITRACAL+D) 315-200 MG-UNIT tablet, Take 1 tablet by mouth daily., Disp: , Rfl:    docusate sodium (COLACE) 100 MG capsule, Take 200 mg by mouth at  bedtime., Disp: , Rfl:    losartan-hydrochlorothiazide (HYZAAR) 50-12.5 MG tablet, Take 1 tablet by mouth in the morning., Disp: , Rfl:    Multiple Vitamin (MULTIVITAMIN WITH MINERALS) TABS tablet, Take 1 tablet by mouth in the morning., Disp: , Rfl:    polyethylene glycol powder (GLYCOLAX/MIRALAX) 17 GM/SCOOP powder, Take 0.5 Containers by mouth. Pt is suppose to take 1/2 dose in am and 1/2 dose in pm, Disp: , Rfl:    Propylene Glycol (SYSTANE COMPLETE OP), Place 1 drop into both eyes 2 (two) times daily as needed (dry/irritated eyes.)., Disp: , Rfl:    trospium (SANCTURA) 20 MG tablet, Take 20 mg by mouth 2 (two) times daily., Disp: , Rfl:    apixaban (ELIQUIS) 5 MG TABS tablet, Take 1 tablet (5 mg total) by mouth 2 (two) times daily. (Patient not taking: Reported on 08/26/2022), Disp: 60 tablet, Rfl: 3   clotrimazole-betamethasone (LOTRISONE) cream, APPLY  CREAM TOPICALLY TWICE DAILY, Disp: 45 g, Rfl: 3   solifenacin (VESICARE) 10 MG tablet, Take 10 mg by mouth in the morning. (Patient not taking: Reported on 08/26/2022), Disp: , Rfl:   Physical exam:  Vitals:   08/26/22 1518  BP: 124/60  Pulse: 72  Resp: 16  Temp:  (!) 97.4 F (36.3 C)  SpO2: 100%  Weight: 145 lb 14.4 oz (66.2 kg)   Physical Exam Cardiovascular:     Rate and Rhythm: Normal rate and regular rhythm.     Heart sounds: Normal heart sounds.  Pulmonary:     Effort: Pulmonary effort is normal.     Breath sounds: Normal breath sounds.  Abdominal:     General: Bowel sounds are normal.     Palpations: Abdomen is soft.  Musculoskeletal:     Comments: Varicosities noted over bilateral lower extremities.  No redness and swelling noted over bilateral extremities.  Skin:    General: Skin is warm and dry.  Neurological:     Mental Status: She is alert and oriented to person, place, and time.         Latest Ref Rng & Units 01/29/2022   10:52 AM  CMP  Glucose 70 - 99 mg/dL 96   BUN 8 - 23 mg/dL 32   Creatinine 8.89 - 1.00 mg/dL 1.69   Sodium 450 - 388 mmol/L 135   Potassium 3.5 - 5.1 mmol/L 4.0   Chloride 98 - 111 mmol/L 100   CO2 22 - 32 mmol/L 29   Calcium 8.9 - 10.3 mg/dL 9.9   Total Protein 6.5 - 8.1 g/dL 6.9   Total Bilirubin 0.3 - 1.2 mg/dL 1.3   Alkaline Phos 38 - 126 U/L 75   AST 15 - 41 U/L 26   ALT 0 - 44 U/L 23       Latest Ref Rng & Units 08/26/2022    2:43 PM  CBC  WBC 4.0 - 10.5 K/uL 6.2   Hemoglobin 12.0 - 15.0 g/dL 82.8   Hematocrit 00.3 - 46.0 % 38.0   Platelets 150 - 400 K/uL 180     Assessment and plan- Patient is a 76 y.o. female with history of bilateral lower extremity DVT not currently on anticoagulation here for routine follow-up  Patient had a bilateral lower extremity DVT following back surgery and was therefore provoked.  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  Patient decided not to stay on long-term anticoagulation and currently remains  off anticoagulation.  Clinically she is doing well.  She can continue to follow-up with her primary care doctor at this time and we referred to [place] in the future if questions or concerns  arise.   Visit Diagnosis 1. Deep vein thrombosis (DVT) of both lower extremities, unspecified chronicity, unspecified vein (HCC)      Dr. Owens Shark, MD, MPH [facility] at [facility] [phone] 08/26/2022 5:24 PM

## 2022-10-08 ENCOUNTER — Ambulatory Visit: Payer: Medicare Other | Admitting: Podiatry

## 2022-10-10 ENCOUNTER — Ambulatory Visit
Admission: RE | Admit: 2022-10-10 | Discharge: 2022-10-10 | Disposition: A | Discharge status: Home / Self Care | Payer: Medicare Other | Source: Ambulatory Visit | Attending: Family Medicine | Admitting: Family Medicine

## 2022-10-10 ENCOUNTER — Ambulatory Visit: Payer: Medicare Other | Admitting: Podiatry

## 2022-10-10 DIAGNOSIS — Z1231 Encounter for screening mammogram for malignant neoplasm of breast: Secondary | ICD-10-CM | POA: Diagnosis present

## 2022-10-14 ENCOUNTER — Ambulatory Visit (INDEPENDENT_AMBULATORY_CARE_PROVIDER_SITE_OTHER): Payer: Medicare Other | Admitting: Podiatry

## 2022-10-14 VITALS — BP 126/68 | HR 65

## 2022-10-14 DIAGNOSIS — L600 Ingrowing nail: Secondary | ICD-10-CM

## 2022-10-14 NOTE — Progress Notes (Signed)
Subjective:  Patient ID: AGAPITA SAVARINO, female    DOB: August 14, 1946,  MRN: [id number]  Chief Complaint  Patient presents with   Ingrown Toenail    Possbile ingrown toenail left hallux, callus right 2nd toe,     77 y.o. female presents with the above complaint.  Patient presents with left hallux medial border ingrown painful to touch is progressive gotten worse worse with ambulation worse with pressure she has not seen anyone as prior to seeing me denies any other acute complaints would like to have it removed.  She is not a diabetic   Review of Systems: Negative except as noted in the HPI. Denies N/V/F/Ch.  Past Medical History:  Diagnosis Date   Acute deep vein thrombosis (DVT) of both lower extremities (HCC)    Arthritis    Asthma    Blood clotting disorder (HCC)    Colon polyp 4098   Complication of anesthesia    nausea   Cystitis    Diabetes mellitus without complication ([place])    no meds   Diffuse cystic mastopathy    Elevated liver enzymes 08/2021   Hypertension    PONV (postoperative nausea and vomiting)    in distant past    Current Outpatient Medications:    apixaban (ELIQUIS) 5 MG TABS tablet, Take 1 tablet (5 mg total) by mouth 2 (two) times daily. (Patient not taking: Reported on 08/26/2022), Disp: 60 tablet, Rfl: 3   AZO-CRANBERRY PO, Take 1 tablet by mouth daily., Disp: , Rfl:    calcium citrate-vitamin D (CITRACAL+D) 315-200 MG-UNIT tablet, Take 1 tablet by mouth daily., Disp: , Rfl:    clotrimazole-betamethasone (LOTRISONE) cream, APPLY  CREAM TOPICALLY TWICE DAILY, Disp: 45 g, Rfl: 3   docusate sodium (COLACE) 100 MG capsule, Take 200 mg by mouth at bedtime., Disp: , Rfl:    losartan-hydrochlorothiazide (HYZAAR) 50-12.5 MG tablet, Take 1 tablet by mouth in the morning., Disp: , Rfl:    Multiple Vitamin (MULTIVITAMIN WITH MINERALS) TABS tablet, Take 1 tablet by mouth in the morning., Disp: , Rfl:    polyethylene glycol powder (GLYCOLAX/MIRALAX) 17  GM/SCOOP powder, Take 0.5 Containers by mouth. Pt is suppose to take 1/2 dose in am and 1/2 dose in pm, Disp: , Rfl:    Propylene Glycol (SYSTANE COMPLETE OP), Place 1 drop into both eyes 2 (two) times daily as needed (dry/irritated eyes.)., Disp: , Rfl:    solifenacin (VESICARE) 10 MG tablet, Take 10 mg by mouth in the morning. (Patient not taking: Reported on 08/26/2022), Disp: , Rfl:    trospium (SANCTURA) 20 MG tablet, Take 20 mg by mouth 2 (two) times daily., Disp: , Rfl:   Social History   Tobacco Use  Smoking Status Never   Passive exposure: Never  Smokeless Tobacco Never    Allergies  Allergen Reactions   Cefazolin Other (See Comments)    Too much bilirubin built up in liver (liver turned yellow)   Objective:   Vitals:   10/14/22 1019  BP: 126/68  Pulse: 65   There is no height or weight on file to calculate BMI. Constitutional Well developed. Well nourished.  Vascular Dorsalis pedis pulses palpable bilaterally. Posterior tibial pulses palpable bilaterally. Capillary refill normal to all digits.  No cyanosis or clubbing noted. Pedal hair growth normal.  Neurologic Normal speech. Oriented to person, place, and time. Epicritic sensation to light touch grossly present bilaterally.  Dermatologic Painful ingrowing nail at medial nail borders of the hallux nail left. No other open  wounds. No skin lesions.  Orthopedic: Normal joint ROM without pain or crepitus bilaterally. No visible deformities. No bony tenderness.   Radiographs: None Assessment:   1. Ingrown left big toenail    Plan:  Patient was evaluated and treated and all questions answered.  Ingrown Nail, left -Patient elects to proceed with minor surgery to remove ingrown toenail removal today. Consent reviewed and signed by patient. -Ingrown nail excised. See procedure note. -Educated on post-procedure care including soaking. Written instructions provided and reviewed. -Patient to follow up in 2 weeks  for nail check.  Procedure: Excision of Ingrown Toenail Location: Left 1st toe medial nail borders. Anesthesia: Lidocaine 1% plain; 1.5 mL and Marcaine 0.5% plain; 1.5 mL, digital block. Skin Prep: Betadine. Dressing: Silvadene; telfa; dry, sterile, compression dressing. Technique: Following skin prep, the toe was exsanguinated and a tourniquet was secured at the base of the toe. The affected nail border was freed, split with a nail splitter, and excised. Chemical matrixectomy was then performed with phenol and irrigated out with alcohol. The tourniquet was then removed and sterile dressing applied. Disposition: Patient tolerated procedure well. Patient to return in 2 weeks for follow-up.   No follow-ups on file.

## 2022-10-28 ENCOUNTER — Telehealth: Payer: Self-pay | Admitting: Podiatry

## 2022-10-28 NOTE — Telephone Encounter (Signed)
Pt stated that she received a message that she needed to make a follow-up appt on Mychart however, Pt stated she's feeling better and doesn't feel she needs a follow-up appointment. If anything changes pt will give [place] a call.

## 2022-11-06 ENCOUNTER — Ambulatory Visit: Payer: Medicare Other | Admitting: Physical Therapy

## 2022-11-13 ENCOUNTER — Encounter: Payer: Medicare Other | Admitting: Physical Therapy

## 2022-11-20 ENCOUNTER — Encounter: Payer: Medicare Other | Admitting: Physical Therapy

## 2022-11-25 ENCOUNTER — Ambulatory Visit: Payer: Medicare Other | Attending: Registered Nurse | Admitting: Physical Therapy

## 2022-11-25 DIAGNOSIS — R278 Other lack of coordination: Secondary | ICD-10-CM | POA: Diagnosis present

## 2022-11-25 DIAGNOSIS — R2689 Other abnormalities of gait and mobility: Secondary | ICD-10-CM | POA: Insufficient documentation

## 2022-11-25 DIAGNOSIS — M4125 Other idiopathic scoliosis, thoracolumbar region: Secondary | ICD-10-CM | POA: Insufficient documentation

## 2022-11-25 NOTE — Therapy (Unsigned)
OUTPATIENT PHYSICAL THERAPY EVALUATION   Patient Name: Chelsey [name] MRN: [id number] DOB:March 24, 1946, 77 y.o., female Today's Date: 11/25/2022   PT End of Session - 11/25/22 1514     Visit Number 1    Number of Visits 10    Date for PT Re-Evaluation 02/03/23    PT Start Time 1450    PT Stop Time 1530    PT Time Calculation (min) 40 min    Activity Tolerance Patient tolerated treatment well    Behavior During Therapy [facility] for tasks assessed/performed             Past Medical History:  Diagnosis Date   Acute deep vein thrombosis (DVT) of both lower extremities (HCC)    Arthritis    Asthma    Blood clotting disorder ([place])    Colon polyp [id number]   Complication of anesthesia    nausea   Cystitis    Diabetes mellitus without complication ([place])    no meds   Diffuse cystic mastopathy    Elevated liver enzymes 08/2021   Hypertension    PONV (postoperative nausea and vomiting)    in distant past   Past Surgical History:  Procedure Laterality Date   bladder prolapse surgery     x 2 - 2021, 2022   BREAST BIOPSY Bilateral    neg   BREAST CYST ASPIRATION Right    neg   BREAST LUMPECTOMY Right 1976   BREAST SURGERY Left 2002   excision   COLONOSCOPY  2011, 09/2013   COLONOSCOPY WITH PROPOFOL N/A 10/29/2018   Procedure: COLONOSCOPY WITH PROPOFOL;  Surgeon: Manya Silvas, MD;  [facility] END[facility]COPY;  Service: Endoscopy;  Laterality: N/A;   FINGER ARTHROPLASTY Right 03/24/2018   Procedure: FINGER ARTHROPLASTY (CMC TENDON INTERPOSITIONAL ARTHROPLASTY);  Surgeon: Leanor Kail, MD;  Location: [facility] ORS;  Service: Orthopedics;  Laterality: Right;   HAND SURGERY Left 04/2012   thumb   INCISION AND DRAINAGE HIP Right 06/17/2019   Procedure: right open gluteus medius repair, it band release, trochanteric bursectomy;  Surgeon: Leim Fabry, MD;  Location: [facility] ORS;  Service: Orthopedics;  Laterality: Right;   laparotomy     exploratory r/t liver   LASER ABLATION  Bilateral 09/2014   Dr Lucky Cowboy   POLYPECTOMY  1994   SPINE SURGERY     TUBAL LIGATION  1976   VAGINAL HYSTERECTOMY  2021   Patient Active Problem List   Diagnosis Date Noted   Degenerative spondylolisthesis 11/29/2021   Lumbar radiculopathy 11/29/2021   Swelling of lower limb 11/29/2021   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 08/27/2021   Elevated liver enzymes 12/12/2019   Overactive bladder 08/30/2019   Uterovaginal prolapse, incomplete 08/30/2019   Vaginal atrophy 08/30/2019   Hepatitis 08/05/2019   Uncontrolled hypertension 07/19/2019   Diabetes ([place]) 07/19/2019   Transaminitis 07/19/2019   Hyperbilirubinemia 07/19/2019   Pessary maintenance 04/16/2019   Hip pain, chronic, right 01/31/2019   Constipation, chronic 07/28/2018   Hx of adenomatous colonic polyps 07/28/2018   Hyperglycemia 06/29/2017   Primary osteoarthritis of first carpometacarpal joint of right hand 12/02/2016   Extensor tenosynovitis of right wrist 07/03/2016   Pain involving joint of finger of left hand 03/27/2016   Cracked skin 12/04/2015   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   Tinea pedis of both feet 08/24/2015   Hypertension 05/22/2014   Osteopenia 05/22/2014   Fibrocystic breast disease 05/20/2013    PCP: Lovie Macadamia  REFERRING PROVIDER: Kennith Gain  REFERRING DIAG:   N94.10 (ICD-10-CM) - Unspecified dyspareunia  N32.81 (ICD-10-CM) - Overactive bladder    Rationale for Evaluation and Treatment Rehabilitation  THERAPY DIAG:  Other idiopathic scoliosis, thoracolumbar region  Other abnormalities of gait and mobility  Other lack of coordination  ONSET DATE:   SUBJECTIVE:                                                                                                                                                                                           SUBJECTIVE STATEMENT: 1) pelvic pain started with intercourse back in Oct 2023. Pt has had  a Hx of prolapse surgery and hysterectomy in 2021. 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 Pt had no bowel / bladder issues as a child. It was only after her prolapse surgery that she started  having recurrent UTIs. Pt takes a longer time to empty her bladder completely but she does not strain. Pt currently is applying estrogen cream for vaginal dryness as prescribed by her gynecologist.   2) R hip started after her spinal surgery 2022. In 2020, pt had  surgery to repair a torn glut muscle.      PERTINENT HISTORY:  Pt got Hepatitis again in 2021 ( first time when she was born, second time at the age of 47, and again at age of 20 in 2021).   Pt has not undergone pelvic PT after prolapse surgery.    PAIN:  Are you having pain? See above   PRECAUTIONS: None  WEIGHT BEARING RESTRICTIONS: No  FALLS:  Has patient fallen in last 6 months? No  LIVING ENVIRONMENT: Lives with: lives with their spouse Lives in: House/apartment Stairs: STE 5 with rails  Has following equipment at home: None  OCCUPATION:  Retired   PLOF: Independent  PATIENT GOALS:  Address R hip  pelvic pain    OBJECTIVE:    [facility] PT Assessment - 11/26/22 0834       AROM   Overall AROM Comments R hip ext in L sidelying 10 deg,  L 18 deg      Strength   Overall Strength Comments R hip flex 3/5,  B hip abd 3/5      Palpation   SI assessment  L iliac crest / R shoulder lowered in standing    Palpation comment L low rib, ASIS, medial malleoli higher  in supine      Ambulation/Gait   Gait Comments 1.02 m/s ,  decreased stance on R, short strides, minimal hip flexion/ ext,  Palpation:  abdominal scar restrictions  over L upper and lower quadrant, R lateral hip               [facility] Adult PT Treatment/Exercise - 11/26/22 0834       Therapeutic Activites    Other Therapeutic Activities Explained Pelvic PT  approaches to address her Sx and goals,    provided shoe lift in heel and toe box in L [place]: See pt instruction section    ASSESSMENT:  CLINICAL IMPRESSION:  Pt is a  77  yo  who presents with pelvic pain and R hip pain which impact QOL, ADL, fitness, and community activities.   Pt's musculoskeletal assessment revealed uneven pelvic girdle and shoulder height, scoliotic spine, asymmetries to gait pattern  with short stride / mobility, , limited spinal /pelvic mobility, dyscoordination and strength of pelvic floor mm, R hip weakness, abdominal and R hip scar restrictions, poor body mechanics which places strain on the abdominal/pelvic floor mm. These are deficits that indicate an ineffective intraabdominal pressure system associated with increased risk for pt's Sx.   Pt was provided education on etiology of Sx with anatomy, physiology explanation with images along with the benefits of customized pelvic PT Tx based on pt's medical conditions and musculoskeletal deficits.  Explained the physiology of deep core mm coordination and roles of pelvic floor function in urination, defecation, sexual function, and postural control with deep core mm system.   Regional interdependent approaches will yield greater benefits in pt's POC.  Following Tx today, shoe lift was provided in heel and toe  box of L shoe. Pt demo'd equal alignment of pelvic girdle and increased gait speed with more reciprocal gait pattern. Plan to address spinal misalignment / abdominal scar restrictions,  and advance towards deep core training for postural stability.    Pt benefits from skilled PT.    OBJECTIVE IMPAIRMENTS decreased activity tolerance, decreased coordination, decreased endurance, decreased mobility, difficulty walking, decreased ROM, decreased strength, decreased safety awareness, hypomobility, increased muscle spasms, impaired flexibility, improper body mechanics,  postural dysfunction, and pain. scar restrictions   ACTIVITY LIMITATIONS  self-care,  home chores, work tasks    PARTICIPATION LIMITATIONS:  community,    PERSONAL FACTORS   Previously untreated scoliosis, prolapse surgery, hysterectomy, abdominal scar restrictions  are also affecting patient's functional outcome.    REHAB POTENTIAL: Good   CLINICAL DECISION MAKING: Evolving/moderate complexity   EVALUATION COMPLEXITY: Moderate    PATIENT EDUCATION:    Education details: Showed pt anatomy images. Explained muscles attachments/ connection, physiology of deep core system/ spinal- thoracic-pelvis-lower kinetic chain as they relate to pt's presentation, Sx, and past Hx. Explained what and how these areas of deficits need to be restored to balance and function    See Therapeutic activity / neuromuscular re-education section  Answered pt's questions.   Person educated: Patient Education method: Explanation, Demonstration, Tactile cues, Verbal cues, and Handouts Education comprehension: verbalized understanding, returned demonstration, verbal cues required, tactile cues required, and needs further education     PLAN: PT FREQUENCY: 1x/week   PT DURATION: 10 weeks   PLANNED INTERVENTIONS: Therapeutic exercises, Therapeutic activity, Neuromuscular re-education, Balance training, Gait training, Patient/Family education, Self Care, Joint mobilization, Spinal mobilization, Moist heat, Taping, and Manual therapy, dry needling.   PLAN FOR NEXT SESSION: See clinical impression for plan     GOALS: Goals reviewed with patient? Yes  SHORT TERM GOALS: Target date: 12/23/2022      Pt will demo IND with HEP                    Baseline: Not IND            Goal status: INITIAL   LONG TERM GOALS: Target date:  02/03/2023    1.Pt will demo proper deep core coordination without chest breathing and optimal excursion of diaphragm/pelvic floor in order to promote spinal stability and pelvic  floor function  Baseline: dyscoordination Goal status: INITIAL  2.  Pt will demo > 5 pt change on FOTO  to improve QOL and function  PFDI Urinary baseline -  Lower score = better function  Urinary Problem baseline-   Higher score = better function  Pelvic Pain baseline - Lower score = better function  Bowel  constipation baseline -   Higher score = better function  PFDI Bowel - Higher score = better function  Hip baseline  -  44 pts  Higher score = better function   Goal status: INITIAL  3.  Pt will demo proper body mechanics in against gravity tasks and ADLs  work tasks, fitness  to minimize straining pelvic floor / back                  Baseline: not IND, improper form that places strain on pelvic floor                Goal status: INITIAL    4. Pt will demo levelled pelvic girdle and shoulder height in order to progress to deep core strengthening HEP and restore mobility at spine, pelvis, gait, posture   Baseline:  Goal status: INITIAL    5. Pt will demo increased gait speed > 1.3 m/s in order to ambulate safely in community and return to fitness routine  Baseline:  1.09 m/s, decreased stance on R, short strides, minimal hip flexion/ ext,  Goal status: INITIAL   6. Pt will increase R hip ext from 10 deg to > 15 deg  in order to improve gait and pelvic function for sexual activities  Baseline: R hip ext in L sidelying 10 deg,  L 18 deg  Goal status: INITIAL     Jerl Mina, PT 11/25/2022, 3:19 PM

## 2022-11-26 NOTE — Patient Instructions (Signed)
Try wearing shoe lift in

## 2022-11-27 ENCOUNTER — Encounter: Payer: Medicare Other | Admitting: Physical Therapy

## 2022-12-03 ENCOUNTER — Encounter: Payer: Medicare Other | Admitting: Physical Therapy

## 2022-12-04 ENCOUNTER — Encounter: Payer: Medicare Other | Admitting: Physical Therapy

## 2022-12-04 ENCOUNTER — Ambulatory Visit: Payer: Medicare Other | Admitting: Physical Therapy

## 2022-12-04 DIAGNOSIS — M4125 Other idiopathic scoliosis, thoracolumbar region: Secondary | ICD-10-CM

## 2022-12-04 DIAGNOSIS — R278 Other lack of coordination: Secondary | ICD-10-CM

## 2022-12-04 DIAGNOSIS — R2689 Other abnormalities of gait and mobility: Secondary | ICD-10-CM

## 2022-12-04 NOTE — Therapy (Signed)
OUTPATIENT PHYSICAL THERAPY  Treatment   Patient Name: Chelsey [name] MRN: [id number] DOB:May 25, 1946, 77 y.o., female Today's Date: 12/04/2022   PT End of Session - 12/04/22 1115     Visit Number 2    Number of Visits 10    Date for PT Re-Evaluation 02/03/23    PT Start Time 1105    PT Stop Time [id number]    PT Time Calculation (min) 40 min    Activity Tolerance Patient tolerated treatment well    Behavior During Therapy [facility] for tasks assessed/performed             Past Medical History:  Diagnosis Date   Acute deep vein thrombosis (DVT) of both lower extremities (HCC)    Arthritis    Asthma    Blood clotting disorder ([place])    Colon polyp [id number]   Complication of anesthesia    nausea   Cystitis    Diabetes mellitus without complication ([place])    no meds   Diffuse cystic mastopathy    Elevated liver enzymes 08/2021   Hypertension    PONV (postoperative nausea and vomiting)    in distant past   Past Surgical History:  Procedure Laterality Date   bladder prolapse surgery     x 2 - 2021, 2022   BREAST BIOPSY Bilateral    neg   BREAST CYST ASPIRATION Right    neg   BREAST LUMPECTOMY Right 1976   BREAST SURGERY Left 2002   excision   COLONOSCOPY  2011, 09/2013   COLONOSCOPY WITH PROPOFOL N/A 10/29/2018   Procedure: COLONOSCOPY WITH PROPOFOL;  Surgeon: Manya Silvas, MD;  Location: [facility] ENDOSCOPY;  Service: Endoscopy;  Laterality: N/A;   FINGER ARTHROPLASTY Right 03/24/2018   Procedure: FINGER ARTHROPLASTY (CMC TENDON INTERPOSITIONAL ARTHROPLASTY);  Surgeon: Leanor Kail, MD;  Location: [facility] ORS;  Service: Orthopedics;  Laterality: Right;   HAND SURGERY Left 04/2012   thumb   INCISION AND DRAINAGE HIP Right 06/17/2019   Procedure: right open gluteus medius repair, it band release, trochanteric bursectomy;  Surgeon: Leim Fabry, MD;  Location: [facility] ORS;  Service: Orthopedics;  Laterality: Right;   laparotomy     exploratory r/t liver   LASER ABLATION  Bilateral 09/2014   Dr Lucky Cowboy   POLYPECTOMY  1994   SPINE SURGERY     TUBAL LIGATION  1976   VAGINAL HYSTERECTOMY  2021   Patient Active Problem List   Diagnosis Date Noted   Degenerative spondylolisthesis 11/29/2021   Lumbar radiculopathy 11/29/2021   Swelling of lower limb 11/29/2021   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 08/27/2021   Elevated liver enzymes 12/12/2019   Overactive bladder 08/30/2019   Uterovaginal prolapse, incomplete 08/30/2019   Vaginal atrophy 08/30/2019   Hepatitis 08/05/2019   Uncontrolled hypertension 07/19/2019   Diabetes ([place]) 07/19/2019   Transaminitis 07/19/2019   Hyperbilirubinemia 07/19/2019   Pessary maintenance 04/16/2019   Hip pain, chronic, right 01/31/2019   Constipation, chronic 07/28/2018   Hx of adenomatous colonic polyps 07/28/2018   Hyperglycemia 06/29/2017   Primary osteoarthritis of first carpometacarpal joint of right hand 12/02/2016   Extensor tenosynovitis of right wrist 07/03/2016   Pain involving joint of finger of left hand 03/27/2016   Cracked skin 12/04/2015   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   Tinea pedis of both feet 08/24/2015   Hypertension 05/22/2014   Osteopenia 05/22/2014   Fibrocystic breast disease 05/20/2013    PCP: Lovie Macadamia  REFERRING PROVIDER: Kennith Gain  REFERRING DIAG:   N94.10 (ICD-10-CM) - Unspecified dyspareunia  N32.81 (ICD-10-CM) - Overactive bladder    Rationale for Evaluation and Treatment Rehabilitation  THERAPY DIAG:  Other abnormalities of gait and mobility  Other idiopathic scoliosis, thoracolumbar region  Other lack of coordination  ONSET DATE:   SUBJECTIVE:              SUBJECTIVE STATEMENT TODAY:  Pt reported she wore shoe lift in L shoe. 5 days later, she noticed burning in the arches of B feet.    Pt saw her MD and learned she has cirrhosis of the liver.                                                                                                                                                                                  SUBJECTIVE STATEMENT EVAL 11/25/22 : 1) pelvic pain started with intercourse back in Oct 2023. Pt has had a Hx of prolapse surgery and hysterectomy in 2021. 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 Pt had no bowel / bladder issues as a child. It was only after her prolapse surgery that she started  having recurrent UTIs. Pt takes a longer time to empty her bladder completely but she does not strain. Pt currently is applying estrogen cream for vaginal dryness as prescribed by her gynecologist.   2) R hip started after her spinal surgery 2022. In 2020, pt had  surgery to repair a torn glut muscle.      PERTINENT HISTORY:  Pt got Hepatitis again in 2021 ( first time when she was born, second time at the age of 9, and again at age of 33 in 2021).   Pt has not undergone pelvic PT after prolapse surgery.    PAIN:  Are you having pain? See above   PRECAUTIONS: None  WEIGHT BEARING RESTRICTIONS: No  FALLS:  Has patient fallen in last 6 months? No  LIVING ENVIRONMENT: Lives with: lives with their spouse Lives in: House/apartment Stairs: STE 5 with rails  Has following equipment at home: None  OCCUPATION:  Retired   PLOF: Independent  PATIENT GOALS:  Address R hip,  pelvic pain    OBJECTIVE:    [facility] PT Assessment - 12/04/22 1117       Palpation   SI assessment  levelled shoulder and pelvis    Palpation comment plantar fascia tightness B, ankle DF at 90 deg      Ambulation/Gait   Gait Comments limping gait, genu valgus L  [place] Adult PT Treatment/Exercise - 12/04/22 1117       Neuro Re-ed    Neuro Re-ed Details  cued for hip strenghtenign and stretching, cued for gait to improve ankle DF and decrease plantar fasciitis      Exercises   Exercises Other  Exercises    Other Exercises  see pt instructions for clam shells ( provided cues)      Manual Therapy   Manual therapy comments STM/MWM at B feet to promote DF/decrease tightenss at plantar fasciitis B                 HOME EXERCISE PROGRAM: See pt instruction section    ASSESSMENT:  CLINICAL IMPRESSION:  Pt returned with levelled pelvic girdle and shoulders with compliance to shoe lift in L except pt reported burning in her arches after wearing one it for one week. Plantar fascia tightness was addressed to and pt achieved increased DF AROM. Cued pt for more push off and DF in gait which pt demo'd correctly post Tx.   Added hip abduction strengthening and stretches to maintain pelvic stability.  Plan to address abdominal scar restrictions,  and advance towards deep core training for postural stability.              Regional interdependence approach is needed given her leg length difference and gait deficits. Pt was explained the connection of this deficit to pelvic and hip issues.   Pt benefits from skilled PT.    OBJECTIVE IMPAIRMENTS decreased activity tolerance, decreased coordination, decreased endurance, decreased mobility, difficulty walking, decreased ROM, decreased strength, decreased safety awareness, hypomobility, increased muscle spasms, impaired flexibility, improper body mechanics, postural dysfunction, and pain. scar restrictions   ACTIVITY LIMITATIONS  self-care,  home chores, work tasks    PARTICIPATION LIMITATIONS:  community,    PERSONAL FACTORS   Previously untreated scoliosis, prolapse surgery, hysterectomy, abdominal scar restrictions  are also affecting patient's functional outcome.    REHAB POTENTIAL: Good   CLINICAL DECISION MAKING: Evolving/moderate complexity   EVALUATION COMPLEXITY: Moderate    PATIENT EDUCATION:    Education details: Showed pt anatomy images. Explained muscles attachments/ connection, physiology of deep core system/ spinal-  thoracic-pelvis-lower kinetic chain as they relate to pt's presentation, Sx, and past Hx. Explained what and how these areas of deficits need to be restored to balance and function    See Therapeutic activity / neuromuscular re-education section  Answered pt's questions.   Person educated: Patient Education method: Explanation, Demonstration, Tactile cues, Verbal cues, and Handouts Education comprehension: verbalized understanding, returned demonstration, verbal cues required, tactile cues required, and needs further education     PLAN: PT FREQUENCY: 1x/week   PT DURATION: 10 weeks   PLANNED INTERVENTIONS: Therapeutic exercises, Therapeutic activity, Neuromuscular re-education, Balance training, Gait training, Patient/Family education, Self Care, Joint mobilization, Spinal mobilization, Moist heat, Taping, and Manual therapy, dry needling.   PLAN FOR NEXT SESSION: See clinical impression for plan     GOALS: Goals reviewed with patient? Yes  SHORT TERM GOALS: Target date: 12/23/2022      Pt will demo IND with HEP                    Baseline: Not IND            Goal status: INITIAL   LONG TERM GOALS: Target date:  02/03/2023    1.Pt will demo proper deep core coordination without chest breathing and optimal excursion of diaphragm/pelvic floor in order to promote  spinal stability and pelvic floor function  Baseline: dyscoordination Goal status: INITIAL  2.  Pt will demo > 5 pt change on FOTO  to improve QOL and function  PFDI Urinary baseline - 33p ts Lower score = better function  PFDI Bowel -29 pts  Higher score = better function  Hip baseline  -  44 pts  Higher score = better function   Goal status: INITIAL  3.  Pt will demo proper body mechanics in against gravity tasks and ADLs  work tasks, fitness  to minimize straining pelvic floor / back                  Baseline: not IND, improper form that places strain on pelvic floor                Goal status:  INITIAL    4. Pt will demo levelled pelvic girdle and shoulder height in order to progress to deep core strengthening HEP and restore mobility at spine, pelvis, gait, posture   Baseline:  L iliac crest R shoulder lowered Goal status: INITIAL    5. Pt will demo increased gait speed > 1.3 m/s in order to ambulate safely in community and return to fitness routine  Baseline:  1.09 m/s, decreased stance on R, short strides, minimal hip flexion/ ext,  Goal status: INITIAL   6. Pt will increase R hip ext from 10 deg to > 15 deg  in order to improve gait and pelvic function for sexual activities  Baseline: R hip ext in L sidelying 10 deg,  L 18 deg  Goal status: INITIAL     Jerl Mina, PT 12/04/2022, 11:15 AM

## 2022-12-04 NOTE — Patient Instructions (Signed)
   Clam Shell 45 Degrees  Lying with hips and knees bent 45, one pillow between knees and ankles. Heel together, toes apart like ballerina,  Lift knee with exhale while pressing heels together. Be sure pelvis does not roll backward. Do not arch back. Do 20 times, each leg, 2 times per day.    Complimentary stretch:  Alternative if hip is tight,   Sit at 45 deg turn with R leg and knee on edge of chair/ bench, L buttock hanging off the edge to bring the L foot back like a lunge, toes bent, lower heel to feel quad stretch,  pay attention to keeping pinky and first toe ballmound planted to align toes forward so ankles are not twerked   Bend ankle for more flexibility of plantar fascia   Repeat with other side   ___  Seated feet slides to increase ankle flexibility  ___  Walk with higher knees for more ankle flexibility and decrease burning at plantar fascia

## 2022-12-10 ENCOUNTER — Ambulatory Visit: Payer: Medicare Other | Admitting: Physical Therapy

## 2022-12-10 DIAGNOSIS — R278 Other lack of coordination: Secondary | ICD-10-CM

## 2022-12-10 DIAGNOSIS — R2689 Other abnormalities of gait and mobility: Secondary | ICD-10-CM

## 2022-12-10 DIAGNOSIS — M4125 Other idiopathic scoliosis, thoracolumbar region: Secondary | ICD-10-CM

## 2022-12-10 NOTE — Therapy (Signed)
OUTPATIENT PHYSICAL THERAPY  Treatment   Patient Name: Chelsey [name] MRN: [id number] DOB:[birth year], 77 y.o., female Today's Date: 12/10/2022   PT End of Session - 12/10/22 1509     Visit Number 3    Number of Visits 10    Date for PT Re-Evaluation 02/03/23    PT Start Time [id number]    PT Stop Time 1550    PT Time Calculation (min) 45 min    Activity Tolerance Patient tolerated treatment well    Behavior During Therapy [facility] for tasks assessed/performed             Past Medical History:  Diagnosis Date   Acute deep vein thrombosis (DVT) of both lower extremities (HCC)    Arthritis    Asthma    Blood clotting disorder ([place])    Colon polyp [id number]   Complication of anesthesia    nausea   Cystitis    Diabetes mellitus without complication ([place])    no meds   Diffuse cystic mastopathy    Elevated liver enzymes 08/2021   Hypertension    PONV (postoperative nausea and vomiting)    in distant past   Past Surgical History:  Procedure Laterality Date   bladder prolapse surgery     x 2 - 2021, 2022   BREAST BIOPSY Bilateral    neg   BREAST CYST ASPIRATION Right    neg   BREAST LUMPECTOMY Right 1976   BREAST SURGERY Left 2002   excision   COLONOSCOPY  2011, 09/2013   COLONOSCOPY WITH PROPOFOL N/A 10/29/2018   Procedure: COLONOSCOPY WITH PROPOFOL;  Surgeon: Manya Silvas, MD;  Location: [facility] ENDOSCOPY;  Service: Endoscopy;  Laterality: N/A;   FINGER ARTHROPLASTY Right 03/24/2018   Procedure: FINGER ARTHROPLASTY (CMC TENDON INTERPOSITIONAL ARTHROPLASTY);  Surgeon: Leanor Kail, MD;  Location: [facility] ORS;  Service: Orthopedics;  Laterality: Right;   HAND SURGERY Left 04/2012   thumb   INCISION AND DRAINAGE HIP Right 06/17/2019   Procedure: right open gluteus medius repair, it band release, trochanteric bursectomy;  Surgeon: Leim Fabry, MD;  Location: [facility] ORS;  Service: Orthopedics;  Laterality: Right;   laparotomy     exploratory r/t liver   LASER ABLATION  Bilateral 09/2014   Dr Lucky Cowboy   POLYPECTOMY  1994   SPINE SURGERY     TUBAL LIGATION  1976   VAGINAL HYSTERECTOMY  2021   Patient Active Problem List   Diagnosis Date Noted   Degenerative spondylolisthesis 11/29/2021   Lumbar radiculopathy 11/29/2021   Swelling of lower limb 11/29/2021   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 08/27/2021   Elevated liver enzymes 12/12/2019   Overactive bladder 08/30/2019   Uterovaginal prolapse, incomplete 08/30/2019   Vaginal atrophy 08/30/2019   Hepatitis 08/05/2019   Uncontrolled hypertension 07/19/2019   Diabetes ([place]) 07/19/2019   Transaminitis 07/19/2019   Hyperbilirubinemia 07/19/2019   Pessary maintenance 04/16/2019   Hip pain, chronic, right 01/31/2019   Constipation, chronic 07/28/2018   Hx of adenomatous colonic polyps 07/28/2018   Hyperglycemia 06/29/2017   Primary osteoarthritis of first carpometacarpal joint of right hand 12/02/2016   Extensor tenosynovitis of right wrist 07/03/2016   Pain involving joint of finger of left hand 03/27/2016   Cracked skin 12/04/2015   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   Tinea pedis of both feet 08/24/2015   Hypertension 05/22/2014   Osteopenia 05/22/2014   Fibrocystic breast disease 05/20/2013    PCP: Lovie Macadamia  REFERRING PROVIDER: Kennith Gain  REFERRING DIAG:   N94.10 (ICD-10-CM) - Unspecified dyspareunia  N32.81 (ICD-10-CM) - Overactive bladder    Rationale for Evaluation and Treatment Rehabilitation  THERAPY DIAG:  Other abnormalities of gait and mobility  Other idiopathic scoliosis, thoracolumbar region  Other lack of coordination  ONSET DATE:   SUBJECTIVE:              SUBJECTIVE STATEMENT TODAY:  Pt reported manual Tx made her feet feel better and wearing her shoe lift in L shoe is ok this week. She still feels wobbly when she walks but not dizzy.                                                                                                                                                                                   SUBJECTIVE STATEMENT EVAL 11/25/22 : 1) pelvic pain started with intercourse back in Oct 2023. Pt has had a Hx of prolapse surgery and hysterectomy in 2021. 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 Pt had no bowel / bladder issues as a child. It was only after her prolapse surgery that she started  having recurrent UTIs. Pt takes a longer time to empty her bladder completely but she does not strain. Pt currently is applying estrogen cream for vaginal dryness as prescribed by her gynecologist.   2) R hip started after her spinal surgery 2022. In 2020, pt had  surgery to repair a torn glut muscle.      PERTINENT HISTORY:  Pt got Hepatitis again in 2021 ( first time when she was born, second time at the age of 52, and again at age of 74 in 2021).   Pt has not undergone pelvic PT after prolapse surgery.    PAIN:  Are you having pain? See above   PRECAUTIONS: None  WEIGHT BEARING RESTRICTIONS: No  FALLS:  Has patient fallen in last 6 months? No  LIVING ENVIRONMENT: Lives with: lives with their spouse Lives in: House/apartment Stairs: STE 5 with rails  Has following equipment at home: None  OCCUPATION:  Retired   PLOF: Independent  PATIENT GOALS:  Address R hip,  pelvic pain    OBJECTIVE:    [facility] PT Assessment - 12/10/22 1541       Palpation   SI assessment  levelled shoulder and pelvis    Palpation comment plantar fascia tightness L, limited toe abduction, tib fib , adductors L              [facility] Adult PT Treatment/Exercise - 12/10/22 1542       Ambulation/Gait  Gait Comments more upright posture, slight limp with decreased stance of L, less adduction      Neuro Re-ed    Neuro Re-ed Details  cued for butterfly stretch supported , log rolling to prevent straining pelvic  floor      Modalities   Modalities Moist Heat      Moist Heat Therapy   Number Minutes Moist Heat 5 Minutes    Moist Heat Location --   unbilled ( butterfly supported) to stretch adductors and pelvic floor, ER knee     Manual Therapy   Manual therapy comments STM/MWM at B feet to promote DF/decrease tightenss at plantar fasciitis L, adductors, superio glide of tib fibto promote ER of L thigh/ tibia ,              HOME EXERCISE PROGRAM: See pt instruction section    ASSESSMENT:  CLINICAL IMPRESSION:  Pt required more manual Tx to L foot and leg to promote less genu valgus on L. Pt tolerated manual Tx without complaints. Post Tx, pt demo'd less limping and more loading on LLE ( which was the shorter leg and has been addressed with heel lift).  With these improvements in her lower kinetic chain, pelvic assessment can begin next session. Suspect tightness of adductors given internal rotation of thigh.               Regional interdependence approach is needed given her leg length difference and gait deficits.              Pt was explained the connection of this deficit to pelvic and hip issues.   Pt benefits from skilled PT.    OBJECTIVE IMPAIRMENTS decreased activity tolerance, decreased coordination, decreased endurance, decreased mobility, difficulty walking, decreased ROM, decreased strength, decreased safety awareness, hypomobility, increased muscle spasms, impaired flexibility, improper body mechanics, postural dysfunction, and pain. scar restrictions   ACTIVITY LIMITATIONS  self-care,  home chores, work tasks    PARTICIPATION LIMITATIONS:  community,    PERSONAL FACTORS   Previously untreated scoliosis, prolapse surgery, hysterectomy, abdominal scar restrictions  are also affecting patient's functional outcome.    REHAB POTENTIAL: Good   CLINICAL DECISION MAKING: Evolving/moderate complexity   EVALUATION COMPLEXITY: Moderate    PATIENT EDUCATION:    Education  details: Showed pt anatomy images. Explained muscles attachments/ connection, physiology of deep core system/ spinal- thoracic-pelvis-lower kinetic chain as they relate to pt's presentation, Sx, and past Hx. Explained what and how these areas of deficits need to be restored to balance and function    See Therapeutic activity / neuromuscular re-education section  Answered pt's questions.   Person educated: Patient Education method: Explanation, Demonstration, Tactile cues, Verbal cues, and Handouts Education comprehension: verbalized understanding, returned demonstration, verbal cues required, tactile cues required, and needs further education     PLAN: PT FREQUENCY: 1x/week   PT DURATION: 10 weeks   PLANNED INTERVENTIONS: Therapeutic exercises, Therapeutic activity, Neuromuscular re-education, Balance training, Gait training, Patient/Family education, Self Care, Joint mobilization, Spinal mobilization, Moist heat, Taping, and Manual therapy, dry needling.   PLAN FOR NEXT SESSION: See clinical impression for plan     GOALS: Goals reviewed with patient? Yes  SHORT TERM GOALS: Target date: 12/23/2022      Pt will demo IND with HEP                    Baseline: Not IND            Goal status: INITIAL  LONG TERM GOALS: Target date:  02/03/2023    1.Pt will demo proper deep core coordination without chest breathing and optimal excursion of diaphragm/pelvic floor in order to promote spinal stability and pelvic floor function  Baseline: dyscoordination Goal status: INITIAL  2.  Pt will demo > 5 pt change on FOTO  to improve QOL and function  PFDI Urinary baseline - 33p ts Lower score = better function  PFDI Bowel -29 pts  Higher score = better function  Hip baseline  -  44 pts  Higher score = better function   Goal status: INITIAL  3.  Pt will demo proper body mechanics in against gravity tasks and ADLs  work tasks, fitness  to minimize straining pelvic floor / back                   Baseline: not IND, improper form that places strain on pelvic floor                Goal status: INITIAL    4. Pt will demo levelled pelvic girdle and shoulder height in order to progress to deep core strengthening HEP and restore mobility at spine, pelvis, gait, posture   Baseline:  L iliac crest R shoulder lowered Goal status: INITIAL    5. Pt will demo increased gait speed > 1.3 m/s in order to ambulate safely in community and return to fitness routine  Baseline:  1.09 m/s, decreased stance on R, short strides, minimal hip flexion/ ext,  Goal status: INITIAL   6. Pt will increase R hip ext from 10 deg to > 15 deg  in order to improve gait and pelvic function for sexual activities  Baseline: R hip ext in L sidelying 10 deg,  L 18 deg  Goal status: INITIAL     Jerl Mina, PT 12/10/2022, 3:09 PM

## 2022-12-10 NOTE — Patient Instructions (Signed)
  Avoid straining pelvic floor, abdominal muscles , spine  Use log rolling technique instead of getting out of bed with your neck or the sit-up     Log rolling into and out of bed   Log rolling into and out of bed If getting out of bed on R side, Bent knees, scoot hips/ shoulder to L  Raise R arm completely overhead, rolling onto armpit  Then lower bent knees to bed to get into complete side lying position  Then drop legs off bed, and push up onto R elbow/forearm, and use L hand to push onto the bed    Dig elbows and feet to lift hte buttocks and scoot without lifting head    __   Butterfly stretch with pillows under leg and ankles    5 min to relax

## 2022-12-11 ENCOUNTER — Encounter: Payer: Medicare Other | Admitting: Physical Therapy

## 2022-12-16 ENCOUNTER — Ambulatory Visit: Payer: Medicare Other | Attending: Registered Nurse | Admitting: Physical Therapy

## 2022-12-16 DIAGNOSIS — R278 Other lack of coordination: Secondary | ICD-10-CM | POA: Insufficient documentation

## 2022-12-16 DIAGNOSIS — R2689 Other abnormalities of gait and mobility: Secondary | ICD-10-CM | POA: Insufficient documentation

## 2022-12-16 DIAGNOSIS — M4125 Other idiopathic scoliosis, thoracolumbar region: Secondary | ICD-10-CM | POA: Diagnosis present

## 2022-12-16 NOTE — Therapy (Unsigned)
OUTPATIENT PHYSICAL THERAPY  Treatment   Patient Name: Chelsey [name] MRN: [id number] DOB:1945-12-03, 77 y.o., female Today's Date: 12/16/2022   PT End of Session - 12/16/22 1507     Visit Number 4    Number of Visits 10    Date for PT Re-Evaluation 02/03/23    PT Start Time 63    PT Stop Time 1552    PT Time Calculation (min) 49 min    Activity Tolerance Patient tolerated treatment well    Behavior During Therapy [facility] for tasks assessed/performed             Past Medical History:  Diagnosis Date   Acute deep vein thrombosis (DVT) of both lower extremities (HCC)    Arthritis    Asthma    Blood clotting disorder ([place])    Colon polyp [id number]   Complication of anesthesia    nausea   Cystitis    Diabetes mellitus without complication ([place])    no meds   Diffuse cystic mastopathy    Elevated liver enzymes 08/2021   Hypertension    PONV (postoperative nausea and vomiting)    in distant past   Past Surgical History:  Procedure Laterality Date   bladder prolapse surgery     x 2 - 2021, 2022   BREAST BIOPSY Bilateral    neg   BREAST CYST ASPIRATION Right    neg   BREAST LUMPECTOMY Right 1976   BREAST SURGERY Left 2002   excision   COLONOSCOPY  2011, 09/2013   COLONOSCOPY WITH PROPOFOL N/A 10/29/2018   Procedure: COLONOSCOPY WITH PROPOFOL;  Surgeon: Manya Silvas, MD;  Location: [facility] ENDOSCOPY;  Service: Endoscopy;  Laterality: N/A;   FINGER ARTHROPLASTY Right 03/24/2018   Procedure: FINGER ARTHROPLASTY ([facility] TENDON INTERPOSITIONAL ARTHROPLASTY);  Surgeon: Leanor Kail, MD;  Location: [facility] ORS;  Service: Orthopedics;  Laterality: Right;   HAND SURGERY Left 04/2012   thumb   INCISION AND DRAINAGE HIP Right 06/17/2019   Procedure: right open gluteus medius repair, it band release, trochanteric bursectomy;  Surgeon: Leim Fabry, MD;  Location: [facility] ORS;  Service: Orthopedics;  Laterality: Right;   laparotomy     exploratory r/t liver   LASER ABLATION  Bilateral 09/2014   Dr Lucky Cowboy   POLYPECTOMY  1994   SPINE SURGERY     TUBAL LIGATION  1976   VAGINAL HYSTERECTOMY  2021   Patient Active Problem List   Diagnosis Date Noted   Degenerative spondylolisthesis 11/29/2021   Lumbar radiculopathy 11/29/2021   Swelling of lower limb 11/29/2021   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 08/27/2021   Elevated liver enzymes 12/12/2019   Overactive bladder 08/30/2019   Uterovaginal prolapse, incomplete 08/30/2019   Vaginal atrophy 08/30/2019   Hepatitis 08/05/2019   Uncontrolled hypertension 07/19/2019   Diabetes ([place]) 07/19/2019   Transaminitis 07/19/2019   Hyperbilirubinemia 07/19/2019   Pessary maintenance 04/16/2019   Hip pain, chronic, right 01/31/2019   Constipation, chronic 07/28/2018   Hx of adenomatous colonic polyps 07/28/2018   Hyperglycemia 06/29/2017   Primary osteoarthritis of first carpometacarpal joint of right hand 12/02/2016   Extensor tenosynovitis of right wrist 07/03/2016   Pain involving joint of finger of left hand 03/27/2016   Cracked skin 12/04/2015   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   Tinea pedis of both feet 08/24/2015   Hypertension 05/22/2014   Osteopenia 05/22/2014   Fibrocystic breast disease 05/20/2013    PCP: Lovie Macadamia  REFERRING PROVIDER: Kennith Gain  REFERRING DIAG:   N94.10 (ICD-10-CM) - Unspecified dyspareunia  N32.81 (ICD-10-CM) - Overactive bladder    Rationale for Evaluation and Treatment Rehabilitation  THERAPY DIAG:  Other abnormalities of gait and mobility  Other idiopathic scoliosis, thoracolumbar region  Other lack of coordination  ONSET DATE:   SUBJECTIVE:              SUBJECTIVE STATEMENT TODAY:  Pt reported she has been doing her exercises                                                                                                                                                                                  SUBJECTIVE STATEMENT EVAL 11/25/22 : 1) pelvic pain started with intercourse back in Oct 2023. Pt has had a Hx of prolapse surgery and hysterectomy in 2021. 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 Pt had no bowel / bladder issues as a child. It was only after her prolapse surgery that she started  having recurrent UTIs. Pt takes a longer time to empty her bladder completely but she does not strain. Pt currently is applying estrogen cream for vaginal dryness as prescribed by her gynecologist.   2) R hip started after her spinal surgery 2022. In 2020, pt had  surgery to repair a torn glut muscle.      PERTINENT HISTORY:  Pt got Hepatitis again in 2021 ( first time when she was born, second time at the age of 74, and again at age of 60 in 2021).   Pt has not undergone pelvic PT after prolapse surgery.    PAIN:  Are you having pain? See above   PRECAUTIONS: None  WEIGHT BEARING RESTRICTIONS: No  FALLS:  Has patient fallen in last 6 months? No  LIVING ENVIRONMENT: Lives with: lives with their spouse Lives in: House/apartment Stairs: STE 5 with rails  Has following equipment at home: None  OCCUPATION:  Retired   PLOF: Independent  PATIENT GOALS:  Address R hip,  pelvic pain    OBJECTIVE:    [facility] PT Assessment - 12/16/22 1509       Palpation   SI assessment  tightness at posterior glut/ SIJ    Palpation comment scar restrictions over R glut, tightenss along h amstring/ thigh / leg on R  (limited ER)      Ambulation/Gait   Gait Comments 1.06 m/s longer stride              [facility] Adult PT Treatment/Exercise - 12/16/22 1509       Neuro Re-ed    Neuro  Re-ed Details  cued for hamstring stretches and cued for DF/EV      Modalities   Modalities Moist Heat      Moist Heat Therapy   Number Minutes Moist Heat 5 Minutes    Moist Heat Location --   R glut (unbilled)      Manual Therapy   Manual therapy comments STM/MWM at problem areas noted in assessment except at SIJ which will addressed at next session              HOME EXERCISE PROGRAM: See pt instruction section    ASSESSMENT:  CLINICAL IMPRESSION:  Pt required more manual Tx to decrease R glut scar restriction to  promote less genu valgus on R which is her shorter leg. Anticipate today's Tx will help pt lie on her R side for sleeping.    Pt tolerated manual Tx without complaints. Shoe lift in R shoe is not causing any pain and pt demo'd levelled pelvic girdle and shoulders with shoe lift in R shoe. Gait mechanics improved as well.  .   With these improvements in her lower kinetic chain, pelvic assessment can begin . Plan to address tight R SIJ and glut / hamstrings. Added hamstring stretches today due to tightness.               Regional interdependence approach is needed given her leg length difference and gait deficits.              Pt was explained the connection of this deficit to pelvic and hip issues.   Pt benefits from skilled PT.    OBJECTIVE IMPAIRMENTS decreased activity tolerance, decreased coordination, decreased endurance, decreased mobility, difficulty walking, decreased ROM, decreased strength, decreased safety awareness, hypomobility, increased muscle spasms, impaired flexibility, improper body mechanics, postural dysfunction, and pain. scar restrictions   ACTIVITY LIMITATIONS  self-care,  home chores, work tasks    PARTICIPATION LIMITATIONS:  community,    PERSONAL FACTORS   Previously untreated scoliosis, prolapse surgery, hysterectomy, abdominal scar restrictions  are also affecting patient's functional outcome.    REHAB POTENTIAL: Good   CLINICAL DECISION MAKING: Evolving/moderate complexity   EVALUATION COMPLEXITY: Moderate    PATIENT EDUCATION:    Education details: Showed pt anatomy images. Explained muscles attachments/ connection, physiology of deep core  system/ spinal- thoracic-pelvis-lower kinetic chain as they relate to pt's presentation, Sx, and past Hx. Explained what and how these areas of deficits need to be restored to balance and function    See Therapeutic activity / neuromuscular re-education section  Answered pt's questions.   Person educated: Patient Education method: Explanation, Demonstration, Tactile cues, Verbal cues, and Handouts Education comprehension: verbalized understanding, returned demonstration, verbal cues required, tactile cues required, and needs further education     PLAN: PT FREQUENCY: 1x/week   PT DURATION: 10 weeks   PLANNED INTERVENTIONS: Therapeutic exercises, Therapeutic activity, Neuromuscular re-education, Balance training, Gait training, Patient/Family education, Self Care, Joint mobilization, Spinal mobilization, Moist heat, Taping, and Manual therapy, dry needling.   PLAN FOR NEXT SESSION: See clinical impression for plan     GOALS: Goals reviewed with patient? Yes  SHORT TERM GOALS: Target date: 12/23/2022      Pt will demo IND with HEP                    Baseline: Not IND            Goal status: INITIAL   LONG TERM GOALS: Target date:  02/03/2023  1.Pt will demo proper deep core coordination without chest breathing and optimal excursion of diaphragm/pelvic floor in order to promote spinal stability and pelvic floor function  Baseline: dyscoordination Goal status: INITIAL  2.  Pt will demo > 5 pt change on FOTO  to improve QOL and function  PFDI Urinary baseline - 33p ts Lower score = better function  PFDI Bowel -29 pts  Higher score = better function  Hip baseline  -  44 pts  Higher score = better function   Goal status: INITIAL  3.  Pt will demo proper body mechanics in against gravity tasks and ADLs  work tasks, fitness  to minimize straining pelvic floor / back                  Baseline: not IND, improper form that places strain on pelvic floor                 Goal status: INITIAL    4. Pt will demo levelled pelvic girdle and shoulder height in order to progress to deep core strengthening HEP and restore mobility at spine, pelvis, gait, posture   Baseline:  L iliac crest R shoulder lowered Goal status: INITIAL    5. Pt will demo increased gait speed > 1.3 m/s in order to ambulate safely in community and return to fitness routine  Baseline:  1.09 m/s, decreased stance on R, short strides, minimal hip flexion/ ext,  Goal status: INITIAL   6. Pt will increase R hip ext from 10 deg to > 15 deg  in order to improve gait and pelvic function for sexual activities  Baseline: R hip ext in L sidelying 10 deg,  L 18 deg  Goal status: INITIAL     Jerl Mina, PT 12/16/2022, 3:54 PM

## 2022-12-16 NOTE — Patient Instructions (Addendum)
With bed sheet ( taut) not rubber straps    Hamstring _knee bends  10 reps  With knee pointing straight ( slightly to outside to minimize snapping sensation)      10 reps with knee pointing out towards armpit ( notice the stretch in the medial hamstring muscle)   Plant the pinky side of the foot flat not rolled ankle , ballmounds from big toe to pinky pressed into the bedsheet

## 2022-12-18 ENCOUNTER — Encounter: Payer: Medicare Other | Admitting: Physical Therapy

## 2022-12-23 ENCOUNTER — Ambulatory Visit: Payer: Medicare Other | Admitting: Physical Therapy

## 2022-12-23 DIAGNOSIS — R2689 Other abnormalities of gait and mobility: Secondary | ICD-10-CM

## 2022-12-23 DIAGNOSIS — M4125 Other idiopathic scoliosis, thoracolumbar region: Secondary | ICD-10-CM

## 2022-12-23 DIAGNOSIS — R278 Other lack of coordination: Secondary | ICD-10-CM

## 2022-12-23 NOTE — Therapy (Addendum)
OUTPATIENT PHYSICAL THERAPY  Treatment   Patient Name: Chelsey [name] MRN: [id number] DOB:[birth year], 77 y.o., female Today's Date: 12/16/2022   PT End of Session - 12/23/22     Visit Number 5    Number of Visits 10    Date for PT Re-Evaluation 02/03/23    PT Start Time 6314    PT Stop Time 9702   PT Time Calculation (min) 45 min    Activity Tolerance Patient tolerated treatment well    Behavior During Therapy [facility] for tasks assessed/performed             Past Medical History:  Diagnosis Date   Acute deep vein thrombosis (DVT) of both lower extremities (HCC)    Arthritis    Asthma    Blood clotting disorder (HCC)    Colon polyp 6378   Complication of anesthesia    nausea   Cystitis    Diabetes mellitus without complication ([place])    no meds   Diffuse cystic mastopathy    Elevated liver enzymes 08/2021   Hypertension    PONV (postoperative nausea and vomiting)    in distant past   Past Surgical History:  Procedure Laterality Date   bladder prolapse surgery     x 2 - 2021, 2022   BREAST BIOPSY Bilateral    neg   BREAST CYST ASPIRATION Right    neg   BREAST LUMPECTOMY Right 1976   BREAST SURGERY Left 2002   excision   COLONOSCOPY  2011, 09/2013   COLONOSCOPY WITH PROPOFOL N/A 10/29/2018   Procedure: COLONOSCOPY WITH PROPOFOL;  Surgeon: Manya Silvas, MD;  Location: [facility] ENDOSCOPY;  Service: Endoscopy;  Laterality: N/A;   FINGER ARTHROPLASTY Right 03/24/2018   Procedure: FINGER ARTHROPLASTY (CMC TENDON INTERPOSITIONAL ARTHROPLASTY);  Surgeon: Leanor Kail, MD;  Location: [facility] ORS;  Service: Orthopedics;  Laterality: Right;   HAND SURGERY Left 04/2012   thumb   INCISION AND DRAINAGE HIP Right 06/17/2019   Procedure: right open gluteus medius repair, it band release, trochanteric bursectomy;  Surgeon: Leim Fabry, MD;  Location: [facility] ORS;  Service: Orthopedics;  Laterality: Right;   laparotomy     exploratory r/t liver   LASER ABLATION Bilateral  09/2014   Dr Lucky Cowboy   POLYPECTOMY  1994   SPINE SURGERY     TUBAL LIGATION  1976   VAGINAL HYSTERECTOMY  2021   Patient Active Problem List   Diagnosis Date Noted   Degenerative spondylolisthesis 11/29/2021   Lumbar radiculopathy 11/29/2021   Swelling of lower limb 11/29/2021   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 08/27/2021   Elevated liver enzymes 12/12/2019   Overactive bladder 08/30/2019   Uterovaginal prolapse, incomplete 08/30/2019   Vaginal atrophy 08/30/2019   Hepatitis 08/05/2019   Uncontrolled hypertension 07/19/2019   Diabetes ([place]) 07/19/2019   Transaminitis 07/19/2019   Hyperbilirubinemia 07/19/2019   Pessary maintenance 04/16/2019   Hip pain, chronic, right 01/31/2019   Constipation, chronic 07/28/2018   Hx of adenomatous colonic polyps 07/28/2018   Hyperglycemia 06/29/2017   Primary osteoarthritis of first carpometacarpal joint of right hand 12/02/2016   Extensor tenosynovitis of right wrist 07/03/2016   Pain involving joint of finger of left hand 03/27/2016   Cracked skin 12/04/2015   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   Tinea pedis of both feet 08/24/2015   Hypertension 05/22/2014   Osteopenia 05/22/2014   Fibrocystic breast disease 05/20/2013    PCP: Lovie Macadamia  REFERRING PROVIDER: Kennith Gain  REFERRING DIAG:   N94.10 (ICD-10-CM) - Unspecified dyspareunia  N32.81 (ICD-10-CM) - Overactive bladder    Rationale for Evaluation and Treatment Rehabilitation  THERAPY DIAG:  Other abnormalities of gait and mobility  Other idiopathic scoliosis, thoracolumbar region  Other lack of coordination  ONSET DATE:   SUBJECTIVE:              SUBJECTIVE STATEMENT TODAY:  Pt reported the shoe lift in L shoe two weeks ago has not caused pain. Pt noticed medial R knee hurting.                                                                                                                                                                                  SUBJECTIVE STATEMENT EVAL 11/25/22 : 1) pelvic pain started with intercourse back in Oct 2023. Pt has had a Hx of prolapse surgery and hysterectomy in 2021. 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 Pt had no bowel / bladder issues as a child. It was only after her prolapse surgery that she started  having recurrent UTIs. Pt takes a longer time to empty her bladder completely but she does not strain. Pt currently is applying estrogen cream for vaginal dryness as prescribed by her gynecologist.   2) R hip started after her spinal surgery 2022. In 2020, pt had  surgery to repair a torn glut muscle.      PERTINENT HISTORY:  Pt got Hepatitis again in 2021 ( first time when she was born, second time at the age of 61, and again at age of 14 in 2021).   Pt has not undergone pelvic PT after prolapse surgery.    PAIN:  Are you having pain? See above   PRECAUTIONS: None  WEIGHT BEARING RESTRICTIONS: No  FALLS:  Has patient fallen in last 6 months? No  LIVING ENVIRONMENT: Lives with: lives with their spouse Lives in: House/apartment Stairs: STE 5 with rails  Has following equipment at home: None  OCCUPATION:  Retired   PLOF: Independent  PATIENT GOALS:  Address R hip,  pelvic pain    OBJECTIVE:    [facility] PT Assessment - 12/23/22 1827       Palpation   Palpation comment scar restrictions over L upper quadrant ,  tightness along R calf / medial knee and hamstring attachments , increased feet mobility compared to past session             [facility] Adult PT Treatment/Exercise - 12/23/22 1828       Neuro Re-ed    Neuro Re-ed Details  cued  for lower knietic chain strengthening, toe abduction, ER tibia / thigh with UE support      Manual Therapy   Manual therapy comments STM/MWM at problem areas noted in assessment to promote less R knee pain and more  ER of tibia/ thigh to minimzie R knee pain ( shorter leg with shoe lift now)                HOME EXERCISE PROGRAM: See pt instruction section    ASSESSMENT:  CLINICAL IMPRESSION: Pt shows improved feet mobility which is helping improve her gait. Shoe lift in R shoe is realigning her spine and pelvis.     Pt required more manual Tx today  to promote less R knee pain and more ER of tibia/   thigh. Pt's R shorter leg with shoe lift in place will help.   Continue to address abdominal scar restrictions and review lower kinetic chain HEP to ensure stronger feet , improved alignment of knee and hips. Then progress to pelvic floor training for pelvic function to minimize overactivity of pelvic floor mm.   Pt benefits from skilled PT.    OBJECTIVE IMPAIRMENTS decreased activity tolerance, decreased coordination, decreased endurance, decreased mobility, difficulty walking, decreased ROM, decreased strength, decreased safety awareness, hypomobility, increased muscle spasms, impaired flexibility, improper body mechanics, postural dysfunction, and pain. scar restrictions   ACTIVITY LIMITATIONS  self-care,  home chores, work tasks    PARTICIPATION LIMITATIONS:  community,    PERSONAL FACTORS   Previously untreated scoliosis, prolapse surgery, hysterectomy, abdominal scar restrictions  are also affecting patient's functional outcome.    REHAB POTENTIAL: Good   CLINICAL DECISION MAKING: Evolving/moderate complexity   EVALUATION COMPLEXITY: Moderate    PATIENT EDUCATION:    Education details: Showed pt anatomy images. Explained muscles attachments/ connection, physiology of deep core system/ spinal- thoracic-pelvis-lower kinetic chain as they relate to pt's presentation, Sx, and past Hx. Explained what and how these areas of deficits need to be restored to balance and function    See Therapeutic activity / neuromuscular re-education section  Answered pt's questions.   Person  educated: Patient Education method: Explanation, Demonstration, Tactile cues, Verbal cues, and Handouts Education comprehension: verbalized understanding, returned demonstration, verbal cues required, tactile cues required, and needs further education     PLAN: PT FREQUENCY: 1x/week   PT DURATION: 10 weeks   PLANNED INTERVENTIONS: Therapeutic exercises, Therapeutic activity, Neuromuscular re-education, Balance training, Gait training, Patient/Family education, Self Care, Joint mobilization, Spinal mobilization, Moist heat, Taping, and Manual therapy, dry needling.   PLAN FOR NEXT SESSION: See clinical impression for plan     GOALS: Goals reviewed with patient? Yes  SHORT TERM GOALS: Target date: 12/23/2022      Pt will demo IND with HEP                    Baseline: Not IND            Goal status: INITIAL   LONG TERM GOALS: Target date:  02/03/2023    1.Pt will demo proper deep core coordination without chest breathing and optimal excursion of diaphragm/pelvic floor in order to promote spinal stability and pelvic floor function  Baseline: dyscoordination Goal status: INITIAL  2.  Pt will demo > 5 pt change on FOTO  to improve QOL and function  PFDI Urinary baseline - 33p ts Lower score = better function  PFDI Bowel -29 pts  Higher score = better function  Hip baseline  -  44  pts  Higher score = better function   Goal status: INITIAL  3.  Pt will demo proper body mechanics in against gravity tasks and ADLs  work tasks, fitness  to minimize straining pelvic floor / back                  Baseline: not IND, improper form that places strain on pelvic floor                Goal status: INITIAL    4. Pt will demo levelled pelvic girdle and shoulder height in order to progress to deep core strengthening HEP and restore mobility at spine, pelvis, gait, posture   Baseline:  L iliac crest R shoulder lowered Goal status: INITIAL    5. Pt will demo increased gait speed  > 1.3 m/s in order to ambulate safely in community and return to fitness routine  Baseline:  1.09 m/s, decreased stance on R, short strides, minimal hip flexion/ ext,  Goal status: INITIAL   6. Pt will increase R hip ext from 10 deg to > 15 deg  in order to improve gait and pelvic function for sexual activities  Baseline: R hip ext in L sidelying 10 deg,  L 18 deg  Goal status: INITIAL     Jerl Mina, PT 12/16/2022, 3:54 PM

## 2022-12-23 NOTE — Patient Instructions (Signed)
   Clam Shell 45 Degrees  Lying with hips and knees bent 45, one pillow between knees and ankles. Heel together, toes apart like ballerina,  Lift knee with exhale while pressing heels together. Be sure pelvis does not roll backward. Do not arch back. Do 20 times, each leg, 2 times per day.    Complimentary stretch:  Alternative if hip is tight,   Sit at 45 deg turn with R leg and knee on edge of chair/ bench, L buttock hanging off the edge to bring the L foot back like a lunge, toes bent, lower heel to feel quad stretch,  pay attention to keeping pinky and first toe ballmound planted to align toes forward so ankles are not twerked   Repeat with other side   ___  Strengthening feet arches:    Heel raises - heels together, minisquat  Minisquat motion, trunk bent , gaze onto floor like you are looking at your reflection over a lake/pond,  Knees bent pointed out like a "v" , navel ( center of mass) more forward  Heels together as you lift, pointed out like a "v"  KNEES ARE ALIGNED BEHIND THE TOES TO [place] your  navel ( center of mass) more forward to a avoid dropping down fast and rocking more weight back onto heels , keep heels pressing against each other the whole time   10 reps  ________________

## 2022-12-30 ENCOUNTER — Ambulatory Visit: Payer: Medicare Other | Admitting: Physical Therapy

## 2022-12-30 DIAGNOSIS — M4125 Other idiopathic scoliosis, thoracolumbar region: Secondary | ICD-10-CM

## 2022-12-30 DIAGNOSIS — R278 Other lack of coordination: Secondary | ICD-10-CM

## 2022-12-30 DIAGNOSIS — R2689 Other abnormalities of gait and mobility: Secondary | ICD-10-CM | POA: Diagnosis not present

## 2022-12-30 NOTE — Therapy (Signed)
OUTPATIENT PHYSICAL THERAPY  Treatment   Patient Name: JARAE JERRETT MRN: [id number] DOB:July 15, 1946, 77 y.o., female Today's Date: 12/16/2022    PT End of Session - 12/30/22 1437     Visit Number 6    Number of Visits 10    Date for PT Re-Evaluation 02/03/23    PT Start Time [id number]    PT Stop Time 1525    PT Time Calculation (min) 40 min    Activity Tolerance Patient tolerated treatment well    Behavior During Therapy [facility] for tasks assessed/performed             Past Medical History:  Diagnosis Date   Acute deep vein thrombosis (DVT) of both lower extremities (HCC)    Arthritis    Asthma    Blood clotting disorder ([place])    Colon polyp [id number]   Complication of anesthesia    nausea   Cystitis    Diabetes mellitus without complication ([place])    no meds   Diffuse cystic mastopathy    Elevated liver enzymes 08/2021   Hypertension    PONV (postoperative nausea and vomiting)    in distant past   Past Surgical History:  Procedure Laterality Date   bladder prolapse surgery     x 2 - 2021, 2022   BREAST BIOPSY Bilateral    neg   BREAST CYST ASPIRATION Right    neg   BREAST LUMPECTOMY Right 1976   BREAST SURGERY Left 2002   excision   COLONOSCOPY  2011, 09/2013   COLONOSCOPY WITH PROPOFOL N/A 10/29/2018   Procedure: COLONOSCOPY WITH PROPOFOL;  Surgeon: Manya Silvas, MD;  Location: [facility] ENDOSCOPY;  Service: Endoscopy;  Laterality: N/A;   FINGER ARTHROPLASTY Right 03/24/2018   Procedure: FINGER ARTHROPLASTY (CMC TENDON INTERPOSITIONAL ARTHROPLASTY);  Surgeon: Leanor Kail, MD;  Location: [facility] ORS;  Service: Orthopedics;  Laterality: Right;   HAND SURGERY Left 04/2012   thumb   INCISION AND DRAINAGE HIP Right 06/17/2019   Procedure: right open gluteus medius repair, it band release, trochanteric bursectomy;  Surgeon: Leim Fabry, MD;  Location: [facility] ORS;  Service: Orthopedics;  Laterality: Right;   laparotomy     exploratory r/t liver   LASER ABLATION  Bilateral 09/2014   Dr Lucky Cowboy   POLYPECTOMY  1994   SPINE SURGERY     TUBAL LIGATION  1976   VAGINAL HYSTERECTOMY  2021   Patient Active Problem List   Diagnosis Date Noted   Degenerative spondylolisthesis 11/29/2021   Lumbar radiculopathy 11/29/2021   Swelling of lower limb 11/29/2021   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 08/27/2021   Elevated liver enzymes 12/12/2019   Overactive bladder 08/30/2019   Uterovaginal prolapse, incomplete 08/30/2019   Vaginal atrophy 08/30/2019   Hepatitis 08/05/2019   Uncontrolled hypertension 07/19/2019   Diabetes ([place]) 07/19/2019   Transaminitis 07/19/2019   Hyperbilirubinemia 07/19/2019   Pessary maintenance 04/16/2019   Hip pain, chronic, right 01/31/2019   Constipation, chronic 07/28/2018   Hx of adenomatous colonic polyps 07/28/2018   Hyperglycemia 06/29/2017   Primary osteoarthritis of first carpometacarpal joint of right hand 12/02/2016   Extensor tenosynovitis of right wrist 07/03/2016   Pain involving joint of finger of left hand 03/27/2016   Cracked skin 12/04/2015   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   Tinea pedis of both feet 08/24/2015   Hypertension 05/22/2014   Osteopenia 05/22/2014   Fibrocystic breast disease 05/20/2013    PCP: Lovie Macadamia  REFERRING PROVIDER:  [place]   REFERRING DIAG:   N94.10 (ICD-10-CM) - Unspecified dyspareunia  N32.81 (ICD-10-CM) - Overactive bladder    Rationale for Evaluation and Treatment Rehabilitation  THERAPY DIAG:  Other abnormalities of gait and mobility  Other idiopathic scoliosis, thoracolumbar region  Other lack of coordination  ONSET DATE:   SUBJECTIVE:              SUBJECTIVE STATEMENT TODAY:  Pt reported B hips hurt more this week. It does not radiate down the leg.                                                                                                                                                                                  SUBJECTIVE STATEMENT EVAL 11/25/22 : 1) pelvic pain started with intercourse back in Oct 2023. Pt has had a Hx of prolapse surgery and hysterectomy in 2021. 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 Pt had no bowel / bladder issues as a child. It was only after her prolapse surgery that she started  having recurrent UTIs. Pt takes a longer time to empty her bladder completely but she does not strain. Pt currently is applying estrogen cream for vaginal dryness as prescribed by her gynecologist.   2) R hip started after her spinal surgery 2022. In 2020, pt had  surgery to repair a torn glut muscle.      PERTINENT HISTORY:  Pt got Hepatitis again in 2021 ( first time when she was born, second time at the age of 74, and again at age of 39 in 2021).   Pt has not undergone pelvic PT after prolapse surgery.    PAIN:  Are you having pain? See above   PRECAUTIONS: None  WEIGHT BEARING RESTRICTIONS: No  FALLS:  Has patient fallen in last 6 months? No  LIVING ENVIRONMENT: Lives with: lives with their spouse Lives in: House/apartment Stairs: STE 5 with rails  Has following equipment at home: None  OCCUPATION:  Retired   PLOF: Independent  PATIENT GOALS:  Address R hip,  pelvic pain    OBJECTIVE:    [facility] PT Assessment - 12/30/22 1539       Palpation   Palpation comment tightness along B SIJ, glut      Ambulation/Gait   Gait Comments 1.31 m/s longer stride, reciprocal gait , less L adduction of hip             [facility] Adult PT Treatment/Exercise - 12/30/22 1539       Neuro Re-ed    Neuro Re-ed Details  cued for pelvic  floor, hamstring stretch      Modalities   Modalities Moist Heat      Moist Heat Therapy   Number Minutes Moist Heat 5 Minutes    Moist Heat Location --   butterfly (supported) unbilled)     Manual Therapy   Manual therapy comments STM/MWM at  problem areas noted in assessment except at SIJ which will addressed at next session                HOME EXERCISE PROGRAM: See pt instruction section    ASSESSMENT:  CLINICAL IMPRESSION: Pt showed increased gait speed and longer stride length with increased SIJ mobility. Manual Tx promoted more SIJ mobility which pt tolerated without pain.   Stretches were provided to increase mobility at pelvic floor and hamstrings.  Anticipate ER of hip, knees, and DF/EV will help minimize pt's hip complaints and further pt towards goals.   Continue to address abdominal scar restrictions and progress to pelvic floor training for pelvic function to minimize overactivity of pelvic floor mm.   Pt benefits from skilled PT.    OBJECTIVE IMPAIRMENTS decreased activity tolerance, decreased coordination, decreased endurance, decreased mobility, difficulty walking, decreased ROM, decreased strength, decreased safety awareness, hypomobility, increased muscle spasms, impaired flexibility, improper body mechanics, postural dysfunction, and pain. scar restrictions   ACTIVITY LIMITATIONS  self-care,  home chores, work tasks    PARTICIPATION LIMITATIONS:  community,    PERSONAL FACTORS   Previously untreated scoliosis, prolapse surgery, hysterectomy, abdominal scar restrictions  are also affecting patient's functional outcome.    REHAB POTENTIAL: Good   CLINICAL DECISION MAKING: Evolving/moderate complexity   EVALUATION COMPLEXITY: Moderate    PATIENT EDUCATION:    Education details: Showed pt anatomy images. Explained muscles attachments/ connection, physiology of deep core system/ spinal- thoracic-pelvis-lower kinetic chain as they relate to pt's presentation, Sx, and past Hx. Explained what and how these areas of deficits need to be restored to balance and function    See Therapeutic activity / neuromuscular re-education section  Answered pt's questions.   Person educated: Patient Education  method: Explanation, Demonstration, Tactile cues, Verbal cues, and Handouts Education comprehension: verbalized understanding, returned demonstration, verbal cues required, tactile cues required, and needs further education     PLAN: PT FREQUENCY: 1x/week   PT DURATION: 10 weeks   PLANNED INTERVENTIONS: Therapeutic exercises, Therapeutic activity, Neuromuscular re-education, Balance training, Gait training, Patient/Family education, Self Care, Joint mobilization, Spinal mobilization, Moist heat, Taping, and Manual therapy, dry needling.   PLAN FOR NEXT SESSION: See clinical impression for plan     GOALS: Goals reviewed with patient? Yes  SHORT TERM GOALS: Target date: 12/23/2022      Pt will demo IND with HEP                    Baseline: Not IND            Goal status: INITIAL   LONG TERM GOALS: Target date:  02/03/2023    1.Pt will demo proper deep core coordination without chest breathing and optimal excursion of diaphragm/pelvic floor in order to promote spinal stability and pelvic floor function  Baseline: dyscoordination Goal status: INITIAL  2.  Pt will demo > 5 pt change on FOTO  to improve QOL and function  PFDI Urinary baseline - 33p ts Lower score = better function  PFDI Bowel -29 pts  Higher score = better function  Hip baseline  -  44 pts  Higher score = better function  Goal status: INITIAL  3.  Pt will demo proper body mechanics in against gravity tasks and ADLs  work tasks, fitness  to minimize straining pelvic floor / back                  Baseline: not IND, improper form that places strain on pelvic floor                Goal status: INITIAL    4. Pt will demo levelled pelvic girdle and shoulder height in order to progress to deep core strengthening HEP and restore mobility at spine, pelvis, gait, posture   Baseline:  L iliac crest R shoulder lowered Goal status: INITIAL    5. Pt will demo increased gait speed > 1.3 m/s in order to  ambulate safely in community and return to fitness routine  Baseline:  1.09 m/s, decreased stance on R, short strides, minimal hip flexion/ ext,  Goal status: INITIAL   6. Pt will increase R hip ext from 10 deg to > 15 deg  in order to improve gait and pelvic function for sexual activities  Baseline: R hip ext in L sidelying 10 deg,  L 18 deg  Goal status: INITIAL     Jerl Mina, PT 12/16/2022, 3:54 PM

## 2022-12-30 NOTE — Patient Instructions (Addendum)
   Baby happy stretch   Hand on back of thighs Alternate straightening knee to stretch hamstring , toes point towards the floor  _   Butterfly pose with pillow under leg/ ankle   __  Side steps along counter L and R= 1 lap  20 laps x 3 x day   THEN HALF step on both feet first lap with left foot leading down a hall way = 1 lap  Repeated with other foot leading =1 lap  2 laps each side

## 2023-01-07 ENCOUNTER — Ambulatory Visit: Payer: Medicare Other | Admitting: Physical Therapy

## 2023-01-07 DIAGNOSIS — R2689 Other abnormalities of gait and mobility: Secondary | ICD-10-CM

## 2023-01-07 DIAGNOSIS — M4125 Other idiopathic scoliosis, thoracolumbar region: Secondary | ICD-10-CM

## 2023-01-07 DIAGNOSIS — R278 Other lack of coordination: Secondary | ICD-10-CM

## 2023-01-07 NOTE — Therapy (Signed)
OUTPATIENT PHYSICAL THERAPY  Treatment   Patient Name: Chelsey [name] MRN: [id number] DOB:Sep 15, 1946, 77 y.o., female Today's Date: 01/07/2023    PT End of Session - 01/07/23 1507     Visit Number 7    Number of Visits 10    Date for PT Re-Evaluation 02/03/23    PT Start Time 1504    PT Stop Time 1546    PT Time Calculation (min) 42 min    Activity Tolerance Patient tolerated treatment well    Behavior During Therapy [facility] for tasks assessed/performed             Past Medical History:  Diagnosis Date   Acute deep vein thrombosis (DVT) of both lower extremities (HCC)    Arthritis    Asthma    Blood clotting disorder (HCC)    Colon polyp [id number]   Complication of anesthesia    nausea   Cystitis    Diabetes mellitus without complication ([place])    no meds   Diffuse cystic mastopathy    Elevated liver enzymes 08/2021   Hypertension    PONV (postoperative nausea and vomiting)    in distant past   Past Surgical History:  Procedure Laterality Date   bladder prolapse surgery     x 2 - 2021, 2022   BREAST BIOPSY Bilateral    neg   BREAST CYST ASPIRATION Right    neg   BREAST LUMPECTOMY Right 1976   BREAST SURGERY Left 2002   excision   COLONOSCOPY  2011, 09/2013   COLONOSCOPY WITH PROPOFOL N/A 10/29/2018   Procedure: COLONOSCOPY WITH PROPOFOL;  Surgeon: Manya Silvas, MD;  Location: [facility] ENDOSCOPY;  Service: Endoscopy;  Laterality: N/A;   FINGER ARTHROPLASTY Right 03/24/2018   Procedure: FINGER ARTHROPLASTY (CMC TENDON INTERPOSITIONAL ARTHROPLASTY);  Surgeon: Leanor Kail, MD;  Location: [facility] ORS;  Service: Orthopedics;  Laterality: Right;   HAND SURGERY Left 04/2012   thumb   INCISION AND DRAINAGE HIP Right 06/17/2019   Procedure: right open gluteus medius repair, it band release, trochanteric bursectomy;  Surgeon: Leim Fabry, MD;  Location: [facility] ORS;  Service: Orthopedics;  Laterality: Right;   laparotomy     exploratory r/t liver   LASER ABLATION  Bilateral 09/2014   Dr Lucky Cowboy   POLYPECTOMY  1994   SPINE SURGERY     TUBAL LIGATION  1976   VAGINAL HYSTERECTOMY  2021   Patient Active Problem List   Diagnosis Date Noted   Degenerative spondylolisthesis 11/29/2021   Lumbar radiculopathy 11/29/2021   Swelling of lower limb 11/29/2021   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 08/27/2021   Elevated liver enzymes 12/12/2019   Overactive bladder 08/30/2019   Uterovaginal prolapse, incomplete 08/30/2019   Vaginal atrophy 08/30/2019   Hepatitis 08/05/2019   Uncontrolled hypertension 07/19/2019   Diabetes ([place]) 07/19/2019   Transaminitis 07/19/2019   Hyperbilirubinemia 07/19/2019   Pessary maintenance 04/16/2019   Hip pain, chronic, right 01/31/2019   Constipation, chronic 07/28/2018   Hx of adenomatous colonic polyps 07/28/2018   Hyperglycemia 06/29/2017   Primary osteoarthritis of first carpometacarpal joint of right hand 12/02/2016   Extensor tenosynovitis of right wrist 07/03/2016   Pain involving joint of finger of left hand 03/27/2016   Cracked skin 12/04/2015   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   Tinea pedis of both feet 08/24/2015   Hypertension 05/22/2014   Osteopenia 05/22/2014   Fibrocystic breast disease 05/20/2013    PCP: Lovie Macadamia  REFERRING PROVIDER:  [place]   REFERRING DIAG:   N94.10 (ICD-10-CM) - Unspecified dyspareunia  N32.81 (ICD-10-CM) - Overactive bladder    Rationale for Evaluation and Treatment Rehabilitation  THERAPY DIAG:  Other abnormalities of gait and mobility  Other idiopathic scoliosis, thoracolumbar region  Other lack of coordination  ONSET DATE:   SUBJECTIVE:              SUBJECTIVE STATEMENT TODAY:  Pt reported she has been doing her HEP                                                                                                                                                                                 SUBJECTIVE  STATEMENT EVAL 11/25/22 : 1) pelvic pain started with intercourse back in Oct 2023. Pt has had a Hx of prolapse surgery and hysterectomy in 2021. 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 Pt had no bowel / bladder issues as a child. It was only after her prolapse surgery that she started  having recurrent UTIs. Pt takes a longer time to empty her bladder completely but she does not strain. Pt currently is applying estrogen cream for vaginal dryness as prescribed by her gynecologist.   2) R hip started after her spinal surgery 2022. In 2020, pt had  surgery to repair a torn glut muscle.      PERTINENT HISTORY:  Pt got Hepatitis again in 2021 ( first time when she was born, second time at the age of 55, and again at age of 28 in 2021).   Pt has not undergone pelvic PT after prolapse surgery.    PAIN:  Are you having pain? See above   PRECAUTIONS: None  WEIGHT BEARING RESTRICTIONS: No  FALLS:  Has patient fallen in last 6 months? No  LIVING ENVIRONMENT: Lives with: lives with their spouse Lives in: House/apartment Stairs: STE 5 with rails  Has following equipment at home: None  OCCUPATION:  Retired   PLOF: Independent  PATIENT GOALS:  Address R hip,  pelvic pain    OBJECTIVE:    [facility] PT Assessment - 01/07/23 1508       Palpation   Palpation comment hypomobile at L SIJ, glut attachments      Ambulation/Gait   Gait Comments improved equal WBing but slight delay with L SIJ with hip flexion ( pre swing)                 HOME EXERCISE PROGRAM: See pt instruction section    ASSESSMENT:  CLINICAL IMPRESSION: Pt required more manual Tx with modified pressure/ technique to  accommodate discomfort and tenderness. Pt demo'd improved mobility of ilia and sacrum on L post Tx. Initiating of pre swing with hip flexion on LLE showed improvement which will help with furthering  decreasing tight pelvic mm.    Anticipate ER of hip, knees, and DF/EV will help minimize pt's hip complaints and further pt towards goals.   Continue to address abdominal scar restrictions and progress to pelvic floor training for pelvic function to minimize overactivity of pelvic floor mm.   Pt benefits from skilled PT.    OBJECTIVE IMPAIRMENTS decreased activity tolerance, decreased coordination, decreased endurance, decreased mobility, difficulty walking, decreased ROM, decreased strength, decreased safety awareness, hypomobility, increased muscle spasms, impaired flexibility, improper body mechanics, postural dysfunction, and pain. scar restrictions   ACTIVITY LIMITATIONS  self-care,  home chores, work tasks    PARTICIPATION LIMITATIONS:  community,    PERSONAL FACTORS   Previously untreated scoliosis, prolapse surgery, hysterectomy, abdominal scar restrictions  are also affecting patient's functional outcome.    REHAB POTENTIAL: Good   CLINICAL DECISION MAKING: Evolving/moderate complexity   EVALUATION COMPLEXITY: Moderate    PATIENT EDUCATION:    Education details: Showed pt anatomy images. Explained muscles attachments/ connection, physiology of deep core system/ spinal- thoracic-pelvis-lower kinetic chain as they relate to pt's presentation, Sx, and past Hx. Explained what and how these areas of deficits need to be restored to balance and function    See Therapeutic activity / neuromuscular re-education section  Answered pt's questions.   Person educated: Patient Education method: Explanation, Demonstration, Tactile cues, Verbal cues, and Handouts Education comprehension: verbalized understanding, returned demonstration, verbal cues required, tactile cues required, and needs further education     PLAN: PT FREQUENCY: 1x/week   PT DURATION: 10 weeks   PLANNED INTERVENTIONS: Therapeutic exercises, Therapeutic activity, Neuromuscular re-education, Balance training, Gait  training, Patient/Family education, Self Care, Joint mobilization, Spinal mobilization, Moist heat, Taping, and Manual therapy, dry needling.   PLAN FOR NEXT SESSION: See clinical impression for plan     GOALS: Goals reviewed with patient? Yes  SHORT TERM GOALS: Target date: 12/23/2022      Pt will demo IND with HEP                    Baseline: Not IND            Goal status: INITIAL   LONG TERM GOALS: Target date:  02/03/2023    1.Pt will demo proper deep core coordination without chest breathing and optimal excursion of diaphragm/pelvic floor in order to promote spinal stability and pelvic floor function  Baseline: dyscoordination Goal status: INITIAL  2.  Pt will demo > 5 pt change on FOTO  to improve QOL and function  PFDI Urinary baseline - 33p ts Lower score = better function  PFDI Bowel -29 pts  Higher score = better function  Hip baseline  -  44 pts  Higher score = better function   Goal status: INITIAL  3.  Pt will demo proper body mechanics in against gravity tasks and ADLs  work tasks, fitness  to minimize straining pelvic floor / back                  Baseline: not IND, improper form that places strain on pelvic floor                Goal status: INITIAL    4. Pt will demo levelled pelvic girdle and shoulder height in order to progress to deep  core strengthening HEP and restore mobility at spine, pelvis, gait, posture   Baseline:  L iliac crest R shoulder lowered Goal status: INITIAL    5. Pt will demo increased gait speed > 1.3 m/s in order to ambulate safely in community and return to fitness routine  Baseline:  1.09 m/s, decreased stance on R, short strides, minimal hip flexion/ ext,  Goal status: INITIAL   6. Pt will increase R hip ext from 10 deg to > 15 deg  in order to improve gait and pelvic function for sexual activities  Baseline: R hip ext in L sidelying 10 deg,  L 18 deg  Goal status: INITIAL     Jerl Mina, PT 01/07/2023,  3:40 PM

## 2023-01-22 ENCOUNTER — Ambulatory Visit: Payer: Medicare Other | Attending: Registered Nurse | Admitting: Physical Therapy

## 2023-01-22 DIAGNOSIS — M4125 Other idiopathic scoliosis, thoracolumbar region: Secondary | ICD-10-CM

## 2023-01-22 DIAGNOSIS — R2689 Other abnormalities of gait and mobility: Secondary | ICD-10-CM | POA: Diagnosis present

## 2023-01-22 DIAGNOSIS — R278 Other lack of coordination: Secondary | ICD-10-CM

## 2023-01-23 NOTE — Therapy (Signed)
OUTPATIENT PHYSICAL THERAPY  Treatment   Patient Name: Chelsey [name] MRN: [id number] DOB:Oct 31, 1945, 77 y.o., female Today's Date: 01/23/2023    PT End of Session - 01/23/23 1131     Visit Number 8    Number of Visits 10    Date for PT Re-Evaluation 02/03/23    PT Start Time 1530    PT Stop Time 1615    PT Time Calculation (min) 45 min    Activity Tolerance Patient tolerated treatment well    Behavior During Therapy [facility] for tasks assessed/performed             Past Medical History:  Diagnosis Date   Acute deep vein thrombosis (DVT) of both lower extremities (HCC)    Arthritis    Asthma    Blood clotting disorder (HCC)    Colon polyp 1994   Complication of anesthesia    nausea   Cystitis    Diabetes mellitus without complication (HCC)    no meds   Diffuse cystic mastopathy    Elevated liver enzymes 08/2021   Hypertension    PONV (postoperative nausea and vomiting)    in distant past   Past Surgical History:  Procedure Laterality Date   bladder prolapse surgery     x 2 - 2021, 2022   BREAST BIOPSY Bilateral    neg   BREAST CYST ASPIRATION Right    neg   BREAST LUMPECTOMY Right 1976   BREAST SURGERY Left 2002   excision   COLONOSCOPY  2011, 09/2013   COLONOSCOPY WITH PROPOFOL N/A 10/29/2018   Procedure: COLONOSCOPY WITH PROPOFOL;  Surgeon: Scot Jun, MD;  Location: [facility] ENDOSCOPY;  Service: Endoscopy;  Laterality: N/A;   FINGER ARTHROPLASTY Right 03/24/2018   Procedure: FINGER ARTHROPLASTY (CMC TENDON INTERPOSITIONAL ARTHROPLASTY);  Surgeon: Erin Sons, MD;  Location: [facility] ORS;  Service: Orthopedics;  Laterality: Right;   HAND SURGERY Left 04/2012   thumb   INCISION AND DRAINAGE HIP Right 06/17/2019   Procedure: right open gluteus medius repair, it band release, trochanteric bursectomy;  Surgeon: Signa Kell, MD;  Location: [facility] ORS;  Service: Orthopedics;  Laterality: Right;   laparotomy     exploratory r/t liver   LASER ABLATION  Bilateral 09/2014   Dr Wyn Quaker   POLYPECTOMY  1994   SPINE SURGERY     TUBAL LIGATION  1976   VAGINAL HYSTERECTOMY  2021   Patient Active Problem List   Diagnosis Date Noted   Degenerative spondylolisthesis 11/29/2021   Lumbar radiculopathy 11/29/2021   Swelling of lower limb 11/29/2021   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 08/27/2021   Elevated liver enzymes 12/12/2019   Overactive bladder 08/30/2019   Uterovaginal prolapse, incomplete 08/30/2019   Vaginal atrophy 08/30/2019   Hepatitis 08/05/2019   Uncontrolled hypertension 07/19/2019   Diabetes 07/19/2019   Transaminitis 07/19/2019   Hyperbilirubinemia 07/19/2019   Pessary maintenance 04/16/2019   Hip pain, chronic, right 01/31/2019   Constipation, chronic 07/28/2018   Hx of adenomatous colonic polyps 07/28/2018   Hyperglycemia 06/29/2017   Primary osteoarthritis of first carpometacarpal joint of right hand 12/02/2016   Extensor tenosynovitis of right wrist 07/03/2016   Pain involving joint of finger of left hand 03/27/2016   Cracked skin 12/04/2015   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   Tinea pedis of both feet 08/24/2015   Hypertension 05/22/2014   Osteopenia 05/22/2014   Fibrocystic breast disease 05/20/2013    PCP: Terance Hart  REFERRING PROVIDER: Tonny Bollman  REFERRING DIAG:   N94.10 (ICD-10-CM) - Unspecified dyspareunia  N32.81 (ICD-10-CM) - Overactive bladder    Rationale for Evaluation and Treatment Rehabilitation  THERAPY DIAG:  Other abnormalities of gait and mobility  Other idiopathic scoliosis, thoracolumbar region  Other lack of coordination  ONSET DATE:   SUBJECTIVE:              SUBJECTIVE STATEMENT TODAY:  Pt reported her 40th anniversary is this weekend. Pt has had pain with sexual intercourse after her prolapse surgery                                                                                                                    SUBJECTIVE STATEMENT EVAL 11/25/22 : 1) pelvic pain started with intercourse back in Oct 2023. Pt has had a Hx of prolapse surgery and hysterectomy in 2021. 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 Pt had no bowel / bladder issues as a child. It was only after her prolapse surgery that she started  having recurrent UTIs. Pt takes a longer time to empty her bladder completely but she does not strain. Pt currently is applying estrogen cream for vaginal dryness as prescribed by her gynecologist.   2) R hip started after her spinal surgery 2022. In 2020, pt had  surgery to repair a torn glut muscle.      PERTINENT HISTORY:  Pt got Hepatitis again in 2021 ( first time when she was born, second time at the age of 89, and again at age of 21 in 2021).   Pt has not undergone pelvic PT after prolapse surgery.    PAIN:  Are you having pain? See above   PRECAUTIONS: None  WEIGHT BEARING RESTRICTIONS: No  FALLS:  Has patient fallen in last 6 months? No  LIVING ENVIRONMENT: Lives with: lives with their spouse Lives in: House/apartment Stairs: STE 5 with rails  Has following equipment at home: None  OCCUPATION:  Retired   PLOF: Independent  PATIENT GOALS:  Address R hip,  pelvic pain    OBJECTIVE:     Pelvic Floor Special Questions - 01/23/23 1141     Pelvic Floor Internal Exam pt consented verbally without contraindications    Exam Type Vaginal    Palpation tightness along L iliococgeus and mm attachment at posterior ischial rami    Strength fair squeeze, definite lift    Biofeedback digital tactile biofeedback, demo'd dyscoordination of pelvic floor with inhalation, limited lengthening             [facility] Adult PT Treatment/Exercise - 01/23/23 1157       Therapeutic Activites    Other Therapeutic Activities explained body mechanics for less pelvic pain      Neuro Re-ed     Neuro Re-ed Details  cued for less chest breathing, optimal pelvic floor lengthening      Manual Therapy   Internal Pelvic Floor  STM/MWM at L pelvic floor noted in assessment                HOME EXERCISE PROGRAM: See pt instruction section    ASSESSMENT:  CLINICAL IMPRESSION: Pt required internal manual Tx with modified pressure/ technique to accommodate discomfort and tenderness in order to decrease posterior L pelvic floor mm tightness. Pt required excessive cues to correct pelvic floor lengthening with coordinated breathing. Pt demo'd proper technique post training which will help minimize pelvic pain with sexual intercourse. Educated pt on body mechanics to minimize pelvic pain.   Anticipate ER of hip, knees, and DF/EV will help minimize pt's hip complaints and further pt towards goals.   Continue to address abdominal scar restrictions and progress to pelvic floor training for pelvic function to minimize overactivity of pelvic floor mm.   Pt benefits from skilled PT.    OBJECTIVE IMPAIRMENTS decreased activity tolerance, decreased coordination, decreased endurance, decreased mobility, difficulty walking, decreased ROM, decreased strength, decreased safety awareness, hypomobility, increased muscle spasms, impaired flexibility, improper body mechanics, postural dysfunction, and pain. scar restrictions   ACTIVITY LIMITATIONS  self-care,  home chores, work tasks    PARTICIPATION LIMITATIONS:  community,    PERSONAL FACTORS   Previously untreated scoliosis, prolapse surgery, hysterectomy, abdominal scar restrictions  are also affecting patient's functional outcome.    REHAB POTENTIAL: Good   CLINICAL DECISION MAKING: Evolving/moderate complexity   EVALUATION COMPLEXITY: Moderate    PATIENT EDUCATION:    Education details: Showed pt anatomy images. Explained muscles attachments/ connection, physiology of deep core system/ spinal- thoracic-pelvis-lower kinetic chain as  they relate to pt's presentation, Sx, and past Hx. Explained what and how these areas of deficits need to be restored to balance and function    See Therapeutic activity / neuromuscular re-education section  Answered pt's questions.   Person educated: Patient Education method: Explanation, Demonstration, Tactile cues, Verbal cues, and Handouts Education comprehension: verbalized understanding, returned demonstration, verbal cues required, tactile cues required, and needs further education     PLAN: PT FREQUENCY: 1x/week   PT DURATION: 10 weeks   PLANNED INTERVENTIONS: Therapeutic exercises, Therapeutic activity, Neuromuscular re-education, Balance training, Gait training, Patient/Family education, Self Care, Joint mobilization, Spinal mobilization, Moist heat, Taping, and Manual therapy, dry needling.   PLAN FOR NEXT SESSION: See clinical impression for plan     GOALS: Goals reviewed with patient? Yes  SHORT TERM GOALS: Target date: 12/23/2022      Pt will demo IND with HEP                    Baseline: Not IND            Goal status: INITIAL   LONG TERM GOALS: Target date:  02/03/2023    1.Pt will demo proper deep core coordination without chest breathing and optimal excursion of diaphragm/pelvic floor in order to promote spinal stability and pelvic floor function  Baseline: dyscoordination Goal status: INITIAL  2.  Pt will demo > 5 pt change on FOTO  to improve QOL and function  PFDI Urinary baseline - 33p ts Lower score = better function  PFDI Bowel -29 pts  Higher score = better function  Hip baseline  -  44 pts  Higher score = better function   Goal status: INITIAL  3.  Pt will demo proper body mechanics in against gravity tasks and ADLs  work tasks, fitness  to minimize straining pelvic floor / back  Baseline: not IND, improper form that places strain on pelvic floor                Goal status: INITIAL    4. Pt will demo levelled  pelvic girdle and shoulder height in order to progress to deep core strengthening HEP and restore mobility at spine, pelvis, gait, posture   Baseline:  L iliac crest R shoulder lowered Goal status: INITIAL    5. Pt will demo increased gait speed > 1.3 m/s in order to ambulate safely in community and return to fitness routine  Baseline:  1.09 m/s, decreased stance on R, short strides, minimal hip flexion/ ext,  Goal status: INITIAL   6. Pt will increase R hip ext from 10 deg to > 15 deg  in order to improve gait and pelvic function for sexual activities  Baseline: R hip ext in L sidelying 10 deg,  L 18 deg  Goal status: INITIAL     Mariane Masters, PT 01/23/2023, 11:33 AM

## 2023-02-04 ENCOUNTER — Ambulatory Visit: Payer: Medicare Other | Admitting: Physical Therapy

## 2023-02-04 DIAGNOSIS — R278 Other lack of coordination: Secondary | ICD-10-CM

## 2023-02-04 DIAGNOSIS — R2689 Other abnormalities of gait and mobility: Secondary | ICD-10-CM

## 2023-02-04 DIAGNOSIS — M4125 Other idiopathic scoliosis, thoracolumbar region: Secondary | ICD-10-CM

## 2023-02-04 NOTE — Therapy (Addendum)
OUTPATIENT PHYSICAL THERAPY  Treatment / Recert   Patient Name: Chelsey [name] MRN: [id number] DOB:06-05-1946, 77 y.o., female Today's Date: 02/04/2023    PT End of Session - 02/04/23 1559     Visit Number 9    Number of Visits 19    Date for PT Re-Evaluation 04/15/23    PT Start Time 1550    PT Stop Time 1630    PT Time Calculation (min) 40 min    Activity Tolerance Patient tolerated treatment well    Behavior During Therapy [facility] for tasks assessed/performed             Past Medical History:  Diagnosis Date   Acute deep vein thrombosis (DVT) of both lower extremities (HCC)    Arthritis    Asthma    Blood clotting disorder (HCC)    Colon polyp 1994   Complication of anesthesia    nausea   Cystitis    Diabetes mellitus without complication (HCC)    no meds   Diffuse cystic mastopathy    Elevated liver enzymes 08/2021   Hypertension    PONV (postoperative nausea and vomiting)    in distant past   Past Surgical History:  Procedure Laterality Date   bladder prolapse surgery     x 2 - 2021, 2022   BREAST BIOPSY Bilateral    neg   BREAST CYST ASPIRATION Right    neg   BREAST LUMPECTOMY Right 1976   BREAST SURGERY Left 2002   excision   COLONOSCOPY  2011, 09/2013   COLONOSCOPY WITH PROPOFOL N/A 10/29/2018   Procedure: COLONOSCOPY WITH PROPOFOL;  Surgeon: Scot Jun, MD;  Location: [facility] ENDOSCOPY;  Service: Endoscopy;  Laterality: N/A;   FINGER ARTHROPLASTY Right 03/24/2018   Procedure: FINGER ARTHROPLASTY (CMC TENDON INTERPOSITIONAL ARTHROPLASTY);  Surgeon: Erin Sons, MD;  Location: [facility] ORS;  Service: Orthopedics;  Laterality: Right;   HAND SURGERY Left 04/2012   thumb   INCISION AND DRAINAGE HIP Right 06/17/2019   Procedure: right open gluteus medius repair, it band release, trochanteric bursectomy;  Surgeon: Signa Kell, MD;  Location: [facility] ORS;  Service: Orthopedics;  Laterality: Right;   laparotomy     exploratory r/t liver   LASER  ABLATION Bilateral 09/2014   Dr Wyn Quaker   POLYPECTOMY  1994   SPINE SURGERY     TUBAL LIGATION  1976   VAGINAL HYSTERECTOMY  2021   Patient Active Problem List   Diagnosis Date Noted   Degenerative spondylolisthesis 11/29/2021   Lumbar radiculopathy 11/29/2021   Swelling of lower limb 11/29/2021   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 08/27/2021   Elevated liver enzymes 12/12/2019   Overactive bladder 08/30/2019   Uterovaginal prolapse, incomplete 08/30/2019   Vaginal atrophy 08/30/2019   Hepatitis 08/05/2019   Uncontrolled hypertension 07/19/2019   Diabetes 07/19/2019   Transaminitis 07/19/2019   Hyperbilirubinemia 07/19/2019   Pessary maintenance 04/16/2019   Hip pain, chronic, right 01/31/2019   Constipation, chronic 07/28/2018   Hx of adenomatous colonic polyps 07/28/2018   Hyperglycemia 06/29/2017   Primary osteoarthritis of first carpometacarpal joint of right hand 12/02/2016   Extensor tenosynovitis of right wrist 07/03/2016   Pain involving joint of finger of left hand 03/27/2016   Cracked skin 12/04/2015   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   Tinea pedis of both feet 08/24/2015   Hypertension 05/22/2014   Osteopenia 05/22/2014   Fibrocystic breast disease 05/20/2013    PCP: Terance Hart  REFERRING  PROVIDER: Garry Heater DIAG:   N94.10 (ICD-10-CM) - Unspecified dyspareunia  N32.81 (ICD-10-CM) - Overactive bladder    Rationale for Evaluation and Treatment Rehabilitation  THERAPY DIAG:  Other abnormalities of gait and mobility  Other lack of coordination  Other idiopathic scoliosis, thoracolumbar region  ONSET DATE:   SUBJECTIVE:              SUBJECTIVE STATEMENT TODAY:  Pt reported she tried sexual intercourse on her anniversary,  she noticed pain , a pop, and something trying to come out gurgling. Pt was not complete penetration.    SUBJECTIVE STATEMENT EVAL 11/25/22 : 1) pelvic pain started  with intercourse back in Oct 2023. Pt has had a Hx of prolapse surgery and hysterectomy in 2021. 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 Pt had no bowel / bladder issues as a child. It was only after her prolapse surgery that she started  having recurrent UTIs. Pt takes a longer time to empty her bladder completely but she does not strain. Pt currently is applying estrogen cream for vaginal dryness as prescribed by her gynecologist.   2) R hip started after her spinal surgery 2022. In 2020, pt had  surgery to repair a torn glut muscle.      PERTINENT HISTORY:  Pt got Hepatitis again in 2021 ( first time when she was born, second time at the age of 23, and again at age of 63 in 2021).   Pt has not undergone pelvic PT after prolapse surgery.    PAIN:  Are you having pain? See above   PRECAUTIONS: None  WEIGHT BEARING RESTRICTIONS: No  FALLS:  Has patient fallen in last 6 months? No  LIVING ENVIRONMENT: Lives with: lives with their spouse Lives in: House/apartment Stairs: STE 5 with rails  Has following equipment at home: None  OCCUPATION:  Retired   PLOF: Independent  PATIENT GOALS:  Address R hip,  pelvic pain    OBJECTIVE:      Pelvic Floor Special Questions - 02/04/23 1628     Pelvic Floor Internal Exam pt consented verbally without contraindications    Exam Type Vaginal    Palpation tightness along 3-9 o'clock 1-2nd layers , lowered urethra    Strength weak squeeze, no lift    Biofeedback digital tactile biofeedback , improved lengthening with less tenderness, cued for more toe abduciton and extension tinstead of flexed toes to promote lengthening of pelvic floor             [facility] Adult PT Treatment/Exercise - 02/04/23 1627       Modalities   Modalities Moist Heat      Moist Heat Therapy   Number Minutes Moist Heat 3 Minutes    Moist Heat Location --    butterfly position ( L sacrum)     Manual Therapy   Internal Pelvic Floor STM/MWM at pelvic floor noted in assessment             HOME EXERCISE PROGRAM: See pt instruction section    ASSESSMENT:  CLINICAL IMPRESSION:  Pt has met 2/6 goals and progressing gradually towards remaining goals. Pt's posture and alignment has improved. R shoe lift has helped with pelvic alignment which is contributing to pelvic floor and pelvic mm function. Pt remains motivated and compliant with HEP.   Pt required internal manual Tx with modified pressure/ technique to  accommodate discomfort and tenderness in order to decrease  pelvic floor mm tightness at 1-2 layers of mm, Urethra was slightly lowered and pt had difficulty eliciting contraction compared to last session.    Pt required excessive cues to correct pelvic floor lengthening with more feet propioception. Pt demo'd proper technique post training.  Continue to address abdominal scar restrictions and progress to pelvic floor training for pelvic function to minimize overactivity of pelvic floor mm.   Pt benefits from skilled PT.    OBJECTIVE IMPAIRMENTS decreased activity tolerance, decreased coordination, decreased endurance, decreased mobility, difficulty walking, decreased ROM, decreased strength, decreased safety awareness, hypomobility, increased muscle spasms, impaired flexibility, improper body mechanics, postural dysfunction, and pain. scar restrictions   ACTIVITY LIMITATIONS  self-care,  home chores, work tasks    PARTICIPATION LIMITATIONS:  community,    PERSONAL FACTORS   Previously untreated scoliosis, prolapse surgery, hysterectomy, abdominal scar restrictions  are also affecting patient's functional outcome.    REHAB POTENTIAL: Good   CLINICAL DECISION MAKING: Evolving/moderate complexity   EVALUATION COMPLEXITY: Moderate    PATIENT EDUCATION:    Education details: Showed pt anatomy images. Explained muscles attachments/  connection, physiology of deep core system/ spinal- thoracic-pelvis-lower kinetic chain as they relate to pt's presentation, Sx, and past Hx. Explained what and how these areas of deficits need to be restored to balance and function    See Therapeutic activity / neuromuscular re-education section  Answered pt's questions.   Person educated: Patient Education method: Explanation, Demonstration, Tactile cues, Verbal cues, and Handouts Education comprehension: verbalized understanding, returned demonstration, verbal cues required, tactile cues required, and needs further education     PLAN: PT FREQUENCY: 1x/week   PT DURATION: 10 weeks   PLANNED INTERVENTIONS: Therapeutic exercises, Therapeutic activity, Neuromuscular re-education, Balance training, Gait training, Patient/Family education, Self Care, Joint mobilization, Spinal mobilization, Moist heat, Taping, and Manual therapy, dry needling.   PLAN FOR NEXT SESSION: See clinical impression for plan     GOALS: Goals reviewed with patient? Yes  SHORT TERM GOALS: Target date: 12/23/2022      Pt will demo IND with HEP                    Baseline: Not IND            Goal status: MET   LONG TERM GOALS: Target date:  04/15/2023     1.Pt will demo proper deep core coordination without chest breathing and optimal excursion of diaphragm/pelvic floor in order to promote spinal stability and pelvic floor function  Baseline: dyscoordination Goal status: MET   2.  Pt will demo > 5 pt change on FOTO  to improve QOL and function  PFDI Urinary baseline - 33p ts Lower score = better function  PFDI Bowel -29 pts  Higher score = better function  Hip baseline  -  44 pts  Higher score = better function   Goal status: Ongoing  3.  Pt will demo proper body mechanics in against gravity tasks and ADLs  work tasks, fitness  to minimize straining pelvic floor / back                  Baseline: not IND, improper form that places strain on  pelvic floor                Goal status: Ongoing    4. Pt will demo levelled pelvic girdle and shoulder height in order to progress to deep core strengthening  HEP and restore mobility at spine, pelvis, gait, posture   Baseline:  L iliac crest R shoulder lowered Goal status: MET     5. Pt will demo increased gait speed > 1.3 m/s in order to ambulate safely in community and return to fitness routine  Baseline:  1.09 m/s, decreased stance on R, short strides, minimal hip flexion/ ext,  Goal status: Ongoing    6. Pt will increase R hip ext from 10 deg to > 15 deg  in order to improve gait and pelvic function for sexual activities  Baseline: R hip ext in L sidelying 10 deg,  L 18 deg  Goal status: Ongoing       Mariane Masters, PT 02/04/2023, 4:00 PM

## 2023-02-18 ENCOUNTER — Ambulatory Visit: Payer: Medicare Other | Attending: Registered Nurse | Admitting: Physical Therapy

## 2023-02-18 DIAGNOSIS — M4125 Other idiopathic scoliosis, thoracolumbar region: Secondary | ICD-10-CM | POA: Diagnosis present

## 2023-02-18 DIAGNOSIS — R2689 Other abnormalities of gait and mobility: Secondary | ICD-10-CM | POA: Insufficient documentation

## 2023-02-18 DIAGNOSIS — R278 Other lack of coordination: Secondary | ICD-10-CM | POA: Insufficient documentation

## 2023-02-18 NOTE — Therapy (Unsigned)
OUTPATIENT PHYSICAL THERAPY  Treatment / Progress Note across 10 visits reporting from  11/25/22 to 02/18/23    Patient Name: Chelsey [name] MRN: [id number] DOB:03-29-1946, 77 y.o., female Today's Date: 02/18/2023    PT End of Session - 02/18/23 1509     Visit Number 10    Number of Visits 19    Date for PT Re-Evaluation 04/15/23    PT Start Time 1503    PT Stop Time 1545    PT Time Calculation (min) 42 min    Activity Tolerance Patient tolerated treatment well    Behavior During Therapy [facility] for tasks assessed/performed             Past Medical History:  Diagnosis Date   Acute deep vein thrombosis (DVT) of both lower extremities (HCC)    Arthritis    Asthma    Blood clotting disorder (HCC)    Colon polyp 1994   Complication of anesthesia    nausea   Cystitis    Diabetes mellitus without complication (HCC)    no meds   Diffuse cystic mastopathy    Elevated liver enzymes 08/2021   Hypertension    PONV (postoperative nausea and vomiting)    in distant past   Past Surgical History:  Procedure Laterality Date   bladder prolapse surgery     x 2 - 2021, 2022   BREAST BIOPSY Bilateral    neg   BREAST CYST ASPIRATION Right    neg   BREAST LUMPECTOMY Right 1976   BREAST SURGERY Left 2002   excision   COLONOSCOPY  2011, 09/2013   COLONOSCOPY WITH PROPOFOL N/A 10/29/2018   Procedure: COLONOSCOPY WITH PROPOFOL;  Surgeon: Scot Jun, MD;  Location: [facility] ENDOSCOPY;  Service: Endoscopy;  Laterality: N/A;   FINGER ARTHROPLASTY Right 03/24/2018   Procedure: FINGER ARTHROPLASTY (CMC TENDON INTERPOSITIONAL ARTHROPLASTY);  Surgeon: Erin Sons, MD;  Location: [facility] ORS;  Service: Orthopedics;  Laterality: Right;   HAND SURGERY Left 04/2012   thumb   INCISION AND DRAINAGE HIP Right 06/17/2019   Procedure: right open gluteus medius repair, it band release, trochanteric bursectomy;  Surgeon: Signa Kell, MD;  Location: [facility] ORS;  Service: Orthopedics;  Laterality:  Right;   laparotomy     exploratory r/t liver   LASER ABLATION Bilateral 09/2014   Dr Wyn Quaker   POLYPECTOMY  1994   SPINE SURGERY     TUBAL LIGATION  1976   VAGINAL HYSTERECTOMY  2021   Patient Active Problem List   Diagnosis Date Noted   Degenerative spondylolisthesis 11/29/2021   Lumbar radiculopathy 11/29/2021   Swelling of lower limb 11/29/2021   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 08/27/2021   Elevated liver enzymes 12/12/2019   Overactive bladder 08/30/2019   Uterovaginal prolapse, incomplete 08/30/2019   Vaginal atrophy 08/30/2019   Hepatitis 08/05/2019   Uncontrolled hypertension 07/19/2019   Diabetes (HCC) 07/19/2019   Transaminitis 07/19/2019   Hyperbilirubinemia 07/19/2019   Pessary maintenance 04/16/2019   Hip pain, chronic, right 01/31/2019   Constipation, chronic 07/28/2018   Hx of adenomatous colonic polyps 07/28/2018   Hyperglycemia 06/29/2017   Primary osteoarthritis of first carpometacarpal joint of right hand 12/02/2016   Extensor tenosynovitis of right wrist 07/03/2016   Pain involving joint of finger of left hand 03/27/2016   Cracked skin 12/04/2015   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   Tinea pedis of both feet 08/24/2015   Hypertension 05/22/2014   Osteopenia 05/22/2014  Fibrocystic breast disease 05/20/2013    PCP: Terance Hart  REFERRING PROVIDER: Garry Heater DIAG:   N94.10 (ICD-10-CM) - Unspecified dyspareunia  N32.81 (ICD-10-CM) - Overactive bladder    Rationale for Evaluation and Treatment Rehabilitation  THERAPY DIAG:  Other abnormalities of gait and mobility  Other lack of coordination  Other idiopathic scoliosis, thoracolumbar region  ONSET DATE:   SUBJECTIVE:              SUBJECTIVE STATEMENT TODAY:  Pt reported she tried sexual intercourse on her anniversary,  she noticed pain , a pop, and something trying to come out gurgling. Pt was not complete penetration.     SUBJECTIVE STATEMENT EVAL 11/25/22 : 1) pelvic pain started with intercourse back in Oct 2023. Pt has had a Hx of prolapse surgery and hysterectomy in 2021. 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 Pt had no bowel / bladder issues as a child. It was only after her prolapse surgery that she started  having recurrent UTIs. Pt takes a longer time to empty her bladder completely but she does not strain. Pt currently is applying estrogen cream for vaginal dryness as prescribed by her gynecologist.   2) R hip started after her spinal surgery 2022. In 2020, pt had  surgery to repair a torn glut muscle.      PERTINENT HISTORY:  Pt got Hepatitis again in 2021 ( first time when she was born, second time at the age of 58, and again at age of 80 in 2021).   Pt has not undergone pelvic PT after prolapse surgery.    PAIN:  Are you having pain? See above   PRECAUTIONS: None  WEIGHT BEARING RESTRICTIONS: No  FALLS:  Has patient fallen in last 6 months? No  LIVING ENVIRONMENT: Lives with: lives with their spouse Lives in: House/apartment Stairs: STE 5 with rails  Has following equipment at home: None  OCCUPATION:  Retired   PLOF: Independent  PATIENT GOALS:  Address R hip,  pelvic pain    OBJECTIVE:          HOME EXERCISE PROGRAM: See pt instruction section    ASSESSMENT:  CLINICAL IMPRESSION: Pt required internal manual Tx with modified pressure/ technique to accommodate discomfort and tenderness in order to decrease  pelvic floor mm tightness at 1-2 layers of mm, Urethra was slightly lowered and pt had difficulty eliciting contraction compared to last session.    Pt required excessive cues to correct pelvic floor lengthening with more feet propioception. Pt demo'd proper technique post training.  Continue to address abdominal scar restrictions and progress to pelvic floor  training for pelvic function to minimize overactivity of pelvic floor mm.   Pt benefits from skilled PT.    OBJECTIVE IMPAIRMENTS decreased activity tolerance, decreased coordination, decreased endurance, decreased mobility, difficulty walking, decreased ROM, decreased strength, decreased safety awareness, hypomobility, increased muscle spasms, impaired flexibility, improper body mechanics, postural dysfunction, and pain. scar restrictions   ACTIVITY LIMITATIONS  self-care,  home chores, work tasks    PARTICIPATION LIMITATIONS:  community,    PERSONAL FACTORS   Previously untreated scoliosis, prolapse surgery, hysterectomy, abdominal scar restrictions  are also affecting patient's functional outcome.    REHAB POTENTIAL: Good   CLINICAL DECISION MAKING: Evolving/moderate complexity   EVALUATION COMPLEXITY: Moderate    PATIENT EDUCATION:    Education details: Showed pt anatomy images. Explained muscles attachments/  connection, physiology of deep core system/ spinal- thoracic-pelvis-lower kinetic chain as they relate to pt's presentation, Sx, and past Hx. Explained what and how these areas of deficits need to be restored to balance and function    See Therapeutic activity / neuromuscular re-education section  Answered pt's questions.   Person educated: Patient Education method: Explanation, Demonstration, Tactile cues, Verbal cues, and Handouts Education comprehension: verbalized understanding, returned demonstration, verbal cues required, tactile cues required, and needs further education     PLAN: PT FREQUENCY: 1x/week   PT DURATION: 10 weeks   PLANNED INTERVENTIONS: Therapeutic exercises, Therapeutic activity, Neuromuscular re-education, Balance training, Gait training, Patient/Family education, Self Care, Joint mobilization, Spinal mobilization, Moist heat, Taping, and Manual therapy, dry needling.   PLAN FOR NEXT SESSION: See clinical impression for plan      GOALS: Goals reviewed with patient? Yes  SHORT TERM GOALS: Target date: 12/23/2022      Pt will demo IND with HEP                    Baseline: Not IND            Goal status: INITIAL   LONG TERM GOALS: Target date:  04/15/2023     1.Pt will demo proper deep core coordination without chest breathing and optimal excursion of diaphragm/pelvic floor in order to promote spinal stability and pelvic floor function  Baseline: dyscoordination Goal status: MET   2.  Pt will demo > 5 pt change on FOTO  to improve QOL and function  PFDI Urinary baseline - 33p ts Lower score = better function  PFDI Bowel -29 pts  Higher score = better function  Hip baseline  -  44 pts  Higher score = better function   Goal status: Ongoing  3.  Pt will demo proper body mechanics in against gravity tasks and ADLs  work tasks, fitness  to minimize straining pelvic floor / back                  Baseline: not IND, improper form that places strain on pelvic floor                Goal status: Ongoing    4. Pt will demo levelled pelvic girdle and shoulder height in order to progress to deep core strengthening HEP and restore mobility at spine, pelvis, gait, posture   Baseline:  L iliac crest R shoulder lowered Goal status: MET     5. Pt will demo increased gait speed > 1.3 m/s in order to ambulate safely in community and return to fitness routine  Baseline:  1.09 m/s, decreased stance on R, short strides, minimal hip flexion/ ext,  Goal status:    6. Pt will increase R hip ext from 10 deg to > 15 deg  in order to improve gait and pelvic function for sexual activities  Baseline: R hip ext in L sidelying 10 deg,  L 18 deg  Goal status: Ongoing       Mariane Masters, PT 02/18/2023, 3:10 PM

## 2023-02-19 NOTE — Addendum Note (Signed)
Addended by: Mariane Masters on: 02/19/2023 10:07 AM   Modules accepted: Orders

## 2023-03-16 ENCOUNTER — Encounter: Payer: Medicare Other | Admitting: Physical Therapy

## 2023-03-23 ENCOUNTER — Encounter: Payer: Medicare Other | Admitting: Physical Therapy

## 2023-03-25 ENCOUNTER — Encounter: Payer: Medicare Other | Admitting: Physical Therapy

## 2023-03-25 ENCOUNTER — Ambulatory Visit: Payer: Medicare Other | Attending: Registered Nurse | Admitting: Physical Therapy

## 2023-03-25 DIAGNOSIS — R278 Other lack of coordination: Secondary | ICD-10-CM | POA: Insufficient documentation

## 2023-03-25 DIAGNOSIS — M4125 Other idiopathic scoliosis, thoracolumbar region: Secondary | ICD-10-CM | POA: Diagnosis present

## 2023-03-25 DIAGNOSIS — R2689 Other abnormalities of gait and mobility: Secondary | ICD-10-CM | POA: Diagnosis present

## 2023-03-26 NOTE — Therapy (Signed)
OUTPATIENT PHYSICAL THERAPY  Treatment    Patient Name: Chelsey [name] MRN: [id number] DOB:30-Sep-1946, 77 y.o., female Today's Date: 03/26/2023    PT End of Session - 03/25/23    Visit Number 11    Number of Visits 19    Date for PT Re-Evaluation 04/15/23    PT Start Time 1500    PT Stop Time 1545    PT Time Calculation (min) 45 min    Activity Tolerance Patient tolerated treatment well    Behavior During Therapy [facility] for tasks assessed/performed             Past Medical History:  Diagnosis Date   Acute deep vein thrombosis (DVT) of both lower extremities (HCC)    Arthritis    Asthma    Blood clotting disorder (HCC)    Colon polyp 1994   Complication of anesthesia    nausea   Cystitis    Diabetes mellitus without complication (HCC)    no meds   Diffuse cystic mastopathy    Elevated liver enzymes 08/2021   Hypertension    PONV (postoperative nausea and vomiting)    in distant past   Past Surgical History:  Procedure Laterality Date   bladder prolapse surgery     x 2 - 2021, 2022   BREAST BIOPSY Bilateral    neg   BREAST CYST ASPIRATION Right    neg   BREAST LUMPECTOMY Right 1976   BREAST SURGERY Left 2002   excision   COLONOSCOPY  2011, 09/2013   COLONOSCOPY WITH PROPOFOL N/A 10/29/2018   Procedure: COLONOSCOPY WITH PROPOFOL;  Surgeon: Scot Jun, MD;  Location: [facility] ENDOSCOPY;  Service: Endoscopy;  Laterality: N/A;   FINGER ARTHROPLASTY Right 03/24/2018   Procedure: FINGER ARTHROPLASTY (CMC TENDON INTERPOSITIONAL ARTHROPLASTY);  Surgeon: Erin Sons, MD;  Location: [facility] ORS;  Service: Orthopedics;  Laterality: Right;   HAND SURGERY Left 04/2012   thumb   INCISION AND DRAINAGE HIP Right 06/17/2019   Procedure: right open gluteus medius repair, it band release, trochanteric bursectomy;  Surgeon: Signa Kell, MD;  Location: [facility] ORS;  Service: Orthopedics;  Laterality: Right;   laparotomy     exploratory r/t liver   LASER ABLATION  Bilateral 09/2014   Dr Wyn Quaker   POLYPECTOMY  1994   SPINE SURGERY     TUBAL LIGATION  1976   VAGINAL HYSTERECTOMY  2021   Patient Active Problem List   Diagnosis Date Noted   Degenerative spondylolisthesis 11/29/2021   Lumbar radiculopathy 11/29/2021   Swelling of lower limb 11/29/2021   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 08/27/2021   Elevated liver enzymes 12/12/2019   Overactive bladder 08/30/2019   Uterovaginal prolapse, incomplete 08/30/2019   Vaginal atrophy 08/30/2019   Hepatitis 08/05/2019   Uncontrolled hypertension 07/19/2019   Diabetes (HCC) 07/19/2019   Transaminitis 07/19/2019   Hyperbilirubinemia 07/19/2019   Pessary maintenance 04/16/2019   Hip pain, chronic, right 01/31/2019   Constipation, chronic 07/28/2018   Hx of adenomatous colonic polyps 07/28/2018   Hyperglycemia 06/29/2017   Primary osteoarthritis of first carpometacarpal joint of right hand 12/02/2016   Extensor tenosynovitis of right wrist 07/03/2016   Pain involving joint of finger of left hand 03/27/2016   Cracked skin 12/04/2015   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   Tinea pedis of both feet 08/24/2015   Hypertension 05/22/2014   Osteopenia 05/22/2014   Fibrocystic breast disease 05/20/2013    PCP: Terance Hart  REFERRING PROVIDER: Tonny Bollman  REFERRING DIAG:   N94.10 (ICD-10-CM) - Unspecified dyspareunia  N32.81 (ICD-10-CM) - Overactive bladder    Rationale for Evaluation and Treatment Rehabilitation  THERAPY DIAG:  Other abnormalities of gait and mobility  Other lack of coordination  Other idiopathic scoliosis, thoracolumbar region  ONSET DATE:   SUBJECTIVE:              SUBJECTIVE STATEMENT TODAY:  Pt reported she has been doing her exercises and working on the deep core coordination properly   SUBJECTIVE STATEMENT EVAL 11/25/22 : 1) pelvic pain started with intercourse back in Oct 2023. Pt has had a Hx of prolapse surgery  and hysterectomy in 2021. 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 Pt had no bowel / bladder issues as a child. It was only after her prolapse surgery that she started  having recurrent UTIs. Pt takes a longer time to empty her bladder completely but she does not strain. Pt currently is applying estrogen cream for vaginal dryness as prescribed by her gynecologist.   2) R hip started after her spinal surgery 2022. In 2020, pt had  surgery to repair a torn glut muscle.    PERTINENT HISTORY:  Pt got Hepatitis again in 2021 ( first time when she was born, second time at the age of 59, and again at age of 20 in 2021).   Pt has not undergone pelvic PT after prolapse surgery.    PAIN:  Are you having pain? See above   PRECAUTIONS: None  WEIGHT BEARING RESTRICTIONS: No  FALLS:  Has patient fallen in last 6 months? No  LIVING ENVIRONMENT: Lives with: lives with their spouse Lives in: House/apartment Stairs: STE 5 with rails  Has following equipment at home: None  OCCUPATION:  Retired   PLOF: Independent  PATIENT GOALS:  Address R hip,  pelvic pain    OBJECTIVE:    [facility] PT Assessment -       Observation/Other Assessments   Observations toe abduction in B feet, no more overlapping toes      Ambulation/Gait   Gait Comments less genu valgus on L             Pelvic Floor Special Questions -    Pelvic Floor Internal Exam pt consented verbally without contraindications    Exam Type Vaginal    Palpation improved coordination with pelvic floor , tightness along B ischicoccygeus, 5 oclock at 2nd layer,  ATLA area B             [facility] Adult PT Treatment/Exercise        Therapeutic Activites    Other Therapeutic Activities explained neuroscience pain to return to pelvic function      Neuro Re-ed    Neuro Re-ed Details  cued for pelvic floor stretches, self-massage at perineum       Modalities   Modalities Moist Heat      Moist Heat Therapy   Moist Heat Location --   perineum, during instruction for deep core , proper pelvic floor lengthening     Manual Therapy   Internal Pelvic Floor STM/MWM at pelvic floor noted in assessment              HOME EXERCISE PROGRAM: See pt instruction section    ASSESSMENT:  CLINICAL IMPRESSION:    Pt continued to  require internal manual Tx again to minimize posterior mm tightness.  Pt showed good carry over with proper coordination with breathing and pelvic floor compared to last session.   Reviewed deep core level 1-2 for proper technique.    Continue to address abdominal scar restrictions and progress to pelvic floor training for pelvic function to minimize overactivity of pelvic floor mm.   Pt benefits from skilled PT.    OBJECTIVE IMPAIRMENTS decreased activity tolerance, decreased coordination, decreased endurance, decreased mobility, difficulty walking, decreased ROM, decreased strength, decreased safety awareness, hypomobility, increased muscle spasms, impaired flexibility, improper body mechanics, postural dysfunction, and pain. scar restrictions   ACTIVITY LIMITATIONS  self-care,  home chores, work tasks    PARTICIPATION LIMITATIONS:  community,    PERSONAL FACTORS   Previously untreated scoliosis, prolapse surgery, hysterectomy, abdominal scar restrictions  are also affecting patient's functional outcome.    REHAB POTENTIAL: Good   CLINICAL DECISION MAKING: Evolving/moderate complexity   EVALUATION COMPLEXITY: Moderate    PATIENT EDUCATION:    Education details: Showed pt anatomy images. Explained muscles attachments/ connection, physiology of deep core system/ spinal- thoracic-pelvis-lower kinetic chain as they relate to pt's presentation, Sx, and past Hx. Explained what and how these areas of deficits need to be restored to balance and function    See Therapeutic activity / neuromuscular  re-education section  Answered pt's questions.   Person educated: Patient Education method: Explanation, Demonstration, Tactile cues, Verbal cues, and Handouts Education comprehension: verbalized understanding, returned demonstration, verbal cues required, tactile cues required, and needs further education     PLAN: PT FREQUENCY: 1x/week   PT DURATION: 10 weeks   PLANNED INTERVENTIONS: Therapeutic exercises, Therapeutic activity, Neuromuscular re-education, Balance training, Gait training, Patient/Family education, Self Care, Joint mobilization, Spinal mobilization, Moist heat, Taping, and Manual therapy, dry needling.   PLAN FOR NEXT SESSION: See clinical impression for plan     GOALS: Goals reviewed with patient? Yes  SHORT TERM GOALS: Target date: 12/23/2022      Pt will demo IND with HEP                    Baseline: Not IND            Goal status: INITIAL   LONG TERM GOALS: Target date:  04/15/2023     1.Pt will demo proper deep core coordination without chest breathing and optimal excursion of diaphragm/pelvic floor in order to promote spinal stability and pelvic floor function  Baseline: dyscoordination Goal status: MET   2.  Pt will demo > 5 pt change on FOTO  to improve QOL and function  PFDI Urinary baseline - 33p ts Lower score = better function  PFDI Bowel -29 pts  Higher score = better function  Hip baseline  -  44 pts  Higher score = better function   Goal status: Ongoing  3.  Pt will demo proper body mechanics in against gravity tasks and ADLs  work tasks, fitness  to minimize straining pelvic floor / back                  Baseline: not IND, improper form that places strain on pelvic floor                Goal status: Ongoing    4. Pt will demo levelled pelvic girdle and shoulder height in order to progress to deep core strengthening HEP and restore mobility at spine, pelvis, gait, posture   Baseline:  L iliac crest R shoulder lowered Goal  status: MET  5. Pt will demo increased gait speed > 1.3 m/s in order to ambulate safely in community and return to fitness routine  Baseline:  1.09 m/s, decreased stance on R, short strides, minimal hip flexion/ ext,  Goal status:    6. Pt will increase R hip ext from 10 deg to > 15 deg  in order to improve gait and pelvic function for sexual activities  Baseline: R hip ext in L sidelying 10 deg,  L 18 deg  Goal status: Ongoing       Mariane Masters, PT 03/26/2023, 12:34 PM

## 2023-04-09 ENCOUNTER — Encounter: Payer: Medicare Other | Admitting: Physical Therapy

## 2023-04-28 ENCOUNTER — Encounter: Payer: Medicare Other | Admitting: Physical Therapy

## 2023-05-05 ENCOUNTER — Encounter: Payer: Medicare Other | Admitting: Physical Therapy

## 2023-05-05 ENCOUNTER — Ambulatory Visit: Payer: Medicare Other | Attending: Registered Nurse | Admitting: Physical Therapy

## 2023-05-05 DIAGNOSIS — M533 Sacrococcygeal disorders, not elsewhere classified: Secondary | ICD-10-CM | POA: Insufficient documentation

## 2023-05-05 DIAGNOSIS — R278 Other lack of coordination: Secondary | ICD-10-CM | POA: Diagnosis present

## 2023-05-05 DIAGNOSIS — M4125 Other idiopathic scoliosis, thoracolumbar region: Secondary | ICD-10-CM | POA: Insufficient documentation

## 2023-05-05 DIAGNOSIS — R2689 Other abnormalities of gait and mobility: Secondary | ICD-10-CM | POA: Diagnosis present

## 2023-05-05 NOTE — Therapy (Signed)
OUTPATIENT PHYSICAL THERAPY  Treatment  / Recert    Patient Name: Chelsey [name] MRN: [id number] DOB:1946-08-14, 77 y.o., female Today's Date: 05/05/2023   PT End of Session - 05/05/23 1607     Visit Number 12    Number of Visits 19    Date for PT Re-Evaluation 07/14/23    PT Start Time 1505    PT Stop Time 1555    PT Time Calculation (min) 50 min    Activity Tolerance Patient tolerated treatment well    Behavior During Therapy [facility] for tasks assessed/performed               Past Medical History:  Diagnosis Date   Acute deep vein thrombosis (DVT) of both lower extremities (HCC)    Arthritis    Asthma    Blood clotting disorder (HCC)    Colon polyp 1994   Complication of anesthesia    nausea   Cystitis    Diabetes mellitus without complication (HCC)    no meds   Diffuse cystic mastopathy    Elevated liver enzymes 08/2021   Hypertension    PONV (postoperative nausea and vomiting)    in distant past   Past Surgical History:  Procedure Laterality Date   bladder prolapse surgery     x 2 - 2021, 2022   BREAST BIOPSY Bilateral    neg   BREAST CYST ASPIRATION Right    neg   BREAST LUMPECTOMY Right 1976   BREAST SURGERY Left 2002   excision   COLONOSCOPY  2011, 09/2013   COLONOSCOPY WITH PROPOFOL N/A 10/29/2018   Procedure: COLONOSCOPY WITH PROPOFOL;  Surgeon: Scot Jun, MD;  Location: [facility] ENDOSCOPY;  Service: Endoscopy;  Laterality: N/A;   FINGER ARTHROPLASTY Right 03/24/2018   Procedure: FINGER ARTHROPLASTY (CMC TENDON INTERPOSITIONAL ARTHROPLASTY);  Surgeon: Erin Sons, MD;  Location: [facility] ORS;  Service: Orthopedics;  Laterality: Right;   HAND SURGERY Left 04/2012   thumb   INCISION AND DRAINAGE HIP Right 06/17/2019   Procedure: right open gluteus medius repair, it band release, trochanteric bursectomy;  Surgeon: Signa Kell, MD;  Location: [facility] ORS;  Service: Orthopedics;  Laterality: Right;   laparotomy     exploratory r/t liver    LASER ABLATION Bilateral 09/2014   Dr Wyn Quaker   POLYPECTOMY  1994   SPINE SURGERY     TUBAL LIGATION  1976   VAGINAL HYSTERECTOMY  2021   Patient Active Problem List   Diagnosis Date Noted   Degenerative spondylolisthesis 11/29/2021   Lumbar radiculopathy 11/29/2021   Swelling of lower limb 11/29/2021   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 08/27/2021   Elevated liver enzymes 12/12/2019   Overactive bladder 08/30/2019   Uterovaginal prolapse, incomplete 08/30/2019   Vaginal atrophy 08/30/2019   Hepatitis 08/05/2019   Uncontrolled hypertension 07/19/2019   Diabetes (HCC) 07/19/2019   Transaminitis 07/19/2019   Hyperbilirubinemia 07/19/2019   Pessary maintenance 04/16/2019   Hip pain, chronic, right 01/31/2019   Constipation, chronic 07/28/2018   Hx of adenomatous colonic polyps 07/28/2018   Hyperglycemia 06/29/2017   Primary osteoarthritis of first carpometacarpal joint of right hand 12/02/2016   Extensor tenosynovitis of right wrist 07/03/2016   Pain involving joint of finger of left hand 03/27/2016   Cracked skin 12/04/2015   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   Tinea pedis of both feet 08/24/2015   Hypertension 05/22/2014   Osteopenia 05/22/2014   Fibrocystic breast disease 05/20/2013  PCP: Laurence Slate PROVIDER: Garry Heater DIAG:   N94.10 (ICD-10-CM) - Unspecified dyspareunia  N32.81 (ICD-10-CM) - Overactive bladder    Rationale for Evaluation and Treatment Rehabilitation  THERAPY DIAG:  Other abnormalities of gait and mobility  Other idiopathic scoliosis, thoracolumbar region  Other lack of coordination  ONSET DATE:   SUBJECTIVE:              SUBJECTIVE STATEMENT TODAY:  Pt reported she has been doing her exercises and working on the deep core coordination properly   SUBJECTIVE STATEMENT EVAL 11/25/22 : 1) pelvic pain started with intercourse back in Oct 2023. Pt has had a Hx of  prolapse surgery and hysterectomy in 2021. 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 Pt had no bowel / bladder issues as a child. It was only after her prolapse surgery that she started  having recurrent UTIs. Pt takes a longer time to empty her bladder completely but she does not strain. Pt currently is applying estrogen cream for vaginal dryness as prescribed by her gynecologist.   2) R hip started after her spinal surgery 2022. In 2020, pt had  surgery to repair a torn glut muscle.    PERTINENT HISTORY:  Pt got Hepatitis again in 2021 ( first time when she was born, second time at the age of 89, and again at age of 56 in 2021).   Pt has not undergone pelvic PT after prolapse surgery.    PAIN:  Are you having pain? See above   PRECAUTIONS: None  WEIGHT BEARING RESTRICTIONS: No  FALLS:  Has patient fallen in last 6 months? No  LIVING ENVIRONMENT: Lives with: lives with their spouse Lives in: House/apartment Stairs: STE 5 with rails  Has following equipment at home: None  OCCUPATION:  Retired   PLOF: Independent  PATIENT GOALS:  Address R hip,  pelvic pain    OBJECTIVE:   Pelvic Floor Special Questions - 05/05/23 1607     Pelvic Floor Internal Exam pt consented verbally without contraindications    Exam Type Vaginal    Palpation less tightness at pelvic floor, dyscoordinaiton with bearing down with exhalation, chest breathing with inhalation    Strength fair squeeze, definite lift             [facility] Adult PT Treatment/Exercise - 05/05/23 1608       Neuro Re-ed    Neuro Re-ed Details  excessive cues for proper coordination of deep core , cued for less dyscoordination      Modalities   Modalities Moist Heat      Moist Heat Therapy   Number Minutes Moist Heat 5 Minutes    Moist Heat Location --   perieum ( butterfly pose)  unbilled     Manual Therapy   Internal Pelvic  Floor tactile cues for proper breathing coordination for optimal pelvic floor lenghtening and contraction              HOME EXERCISE PROGRAM: See pt instruction section    ASSESSMENT:  CLINICAL IMPRESSION:               Pt has achieved 2 out of 6 goals and progressing well towards remaining goals. Leg length difference was addressed and thus, pelvic and spinal alignment has been realigned.  Pt's gait mechanics and hip strength has improved. Feet mobility/ strength and L knee has  less adduction which helps minimize pelvic floor overactivity related to her Sx.   R hip pain has resolved.   Pt shows no more pelvic floor tightness and has increased pelvic mm mobility but showed poor dyscoordination of deep core.  Pt continued to  require tactile feedback to address dyscoordination with bearing down with exhalation, chest breathing with inhalation. Pt demo'd  proper breathing coordination for optimal pelvic floor lenghtening and contraction.  Continue to address abdominal scar restrictions and progress to pelvic floor training for pelvic function to minimize overactivity of pelvic floor mm.   Pt benefits from skilled PT.    OBJECTIVE IMPAIRMENTS decreased activity tolerance, decreased coordination, decreased endurance, decreased mobility, difficulty walking, decreased ROM, decreased strength, decreased safety awareness, hypomobility, increased muscle spasms, impaired flexibility, improper body mechanics, postural dysfunction, and pain. scar restrictions   ACTIVITY LIMITATIONS  self-care,  home chores, work tasks    PARTICIPATION LIMITATIONS:  community,    PERSONAL FACTORS   Previously untreated scoliosis, prolapse surgery, hysterectomy, abdominal scar restrictions  are also affecting patient's functional outcome.    REHAB POTENTIAL: Good   CLINICAL DECISION MAKING: Evolving/moderate complexity   EVALUATION COMPLEXITY: Moderate    PATIENT EDUCATION:    Education details: Showed  pt anatomy images. Explained muscles attachments/ connection, physiology of deep core system/ spinal- thoracic-pelvis-lower kinetic chain as they relate to pt's presentation, Sx, and past Hx. Explained what and how these areas of deficits need to be restored to balance and function    See Therapeutic activity / neuromuscular re-education section  Answered pt's questions.   Person educated: Patient Education method: Explanation, Demonstration, Tactile cues, Verbal cues, and Handouts Education comprehension: verbalized understanding, returned demonstration, verbal cues required, tactile cues required, and needs further education     PLAN: PT FREQUENCY: 1x/week   PT DURATION: 10 weeks   PLANNED INTERVENTIONS: Therapeutic exercises, Therapeutic activity, Neuromuscular re-education, Balance training, Gait training, Patient/Family education, Self Care, Joint mobilization, Spinal mobilization, Moist heat, Taping, and Manual therapy, dry needling.   PLAN FOR NEXT SESSION: See clinical impression for plan     GOALS: Goals reviewed with patient? Yes  SHORT TERM GOALS: Target date: 12/23/2022      Pt will demo IND with HEP                    Baseline: Not IND            Goal status: INITIAL   LONG TERM GOALS: Target date: 07/14/2023       1.Pt will demo proper deep core coordination without chest breathing and optimal excursion of diaphragm/pelvic floor in order to promote spinal stability and pelvic floor function  Baseline: dyscoordination Goal status: MET   2.  Pt will demo > 5 pt change on FOTO  to improve QOL and function  PFDI Urinary baseline - 33p ts Lower score = better function  PFDI Bowel -29 pts  Higher score = better function  Hip baseline  -  44 pts  Higher score = better function   Goal status: Ongoing  3.  Pt will demo proper body mechanics in against gravity tasks and ADLs  work tasks, fitness  to minimize straining pelvic floor / back                   Baseline: not IND, improper form that places strain on pelvic floor                Goal status: Ongoing  4. Pt will demo levelled pelvic girdle and shoulder height in order to progress to deep core strengthening HEP and restore mobility at spine, pelvis, gait, posture   Baseline:  L iliac crest R shoulder lowered Goal status: MET     5. Pt will demo increased gait speed > 1.3 m/s in order to ambulate safely in community and return to fitness routine  Baseline:  1.09 m/s, decreased stance on R, short strides, minimal hip flexion/ ext,  Goal status:  Ongoing    6. Pt will increase R hip ext from 10 deg to > 15 deg  in order to improve gait and pelvic function for sexual activities  Baseline: R hip ext in L sidelying 10 deg,  L 18 deg  Goal status: Ongoing       Mariane Masters, PT 05/05/2023, 4:10 PM

## 2023-05-21 ENCOUNTER — Ambulatory Visit: Payer: Medicare Other | Admitting: Physical Therapy

## 2023-05-21 ENCOUNTER — Encounter: Payer: Medicare Other | Admitting: Physical Therapy

## 2023-05-28 ENCOUNTER — Encounter: Payer: Medicare Other | Admitting: Physical Therapy

## 2023-05-28 ENCOUNTER — Ambulatory Visit: Payer: Medicare Other | Admitting: Physical Therapy

## 2023-07-28 ENCOUNTER — Encounter: Payer: Self-pay | Admitting: Podiatry

## 2023-07-28 ENCOUNTER — Ambulatory Visit (INDEPENDENT_AMBULATORY_CARE_PROVIDER_SITE_OTHER): Payer: Medicare Other | Admitting: Podiatry

## 2023-07-28 VITALS — BP 159/72 | HR 84

## 2023-07-28 DIAGNOSIS — M79674 Pain in right toe(s): Secondary | ICD-10-CM

## 2023-07-28 DIAGNOSIS — B351 Tinea unguium: Secondary | ICD-10-CM | POA: Diagnosis not present

## 2023-07-28 DIAGNOSIS — M79675 Pain in left toe(s): Secondary | ICD-10-CM | POA: Diagnosis not present

## 2023-07-28 DIAGNOSIS — B353 Tinea pedis: Secondary | ICD-10-CM

## 2023-07-28 NOTE — Progress Notes (Signed)
   Chief Complaint  Patient presents with   Nail Problem    "I may have another ingrown toenail on my right big toe and one of the toenails is so thick on that foot." N - ingrown toenail L - hallux rt D - recently O - suddenly noticed it C - looks like it's getting ingrown A - none T - none   Callouses    "That place on my second toe needs to be trimmed again."   Tinea Pedis    "I'd like a written prescription for the Clotrimazole so I can get it through [facility]."    SUBJECTIVE Patient presents to office today complaining of elongated, thickened nails that cause pain while ambulating in shoes.  Patient is unable to trim their own nails. Patient is here for further evaluation and treatment.  Past Medical History:  Diagnosis Date   Acute deep vein thrombosis (DVT) of both lower extremities (HCC)    Arthritis    Asthma    Blood clotting disorder (HCC)    Colon polyp 1994   Complication of anesthesia    nausea   Cystitis    Diabetes mellitus without complication (HCC)    no meds   Diffuse cystic mastopathy    Elevated liver enzymes 08/2021   Hypertension    PONV (postoperative nausea and vomiting)    in distant past    Allergies  Allergen Reactions   Cefazolin Other (See Comments)    Too much bilirubin built up in liver (liver turned yellow)     OBJECTIVE General Patient is awake, alert, and oriented x 3 and in no acute distress. Derm Skin is dry and supple bilateral. Negative open lesions or macerations. Remaining integument unremarkable. Nails are tender, long, thickened and dystrophic with subungual debris, consistent with onychomycosis, 1-5 bilateral. No signs of infection noted.  Mild maceration noted to the interdigital areas of the bilateral feet with intermittent pruritus. Vasc  DP and PT pedal pulses palpable bilaterally. Temperature gradient within normal limits.  Neuro Epicritic and protective threshold sensation grossly intact bilaterally.  Musculoskeletal  Exam No symptomatic pedal deformities noted bilateral. Muscular strength within normal limits.  ASSESSMENT 1.  Pain due to onychomycosis of toenails both 2.  Chronic intermittent tinea pedis bilateral  PLAN OF CARE -Patient evaluated today.  -Instructed to maintain good pedal hygiene and foot care.  -Mechanical debridement of nails 1-5 bilaterally performed using a nail nipper. Filed with dremel without incident.  -OTC tolcylen cream dispensed at checkout -Return to clinic as needed   Felecia Shelling, DPM [facility]  Dr. Felecia Shelling, DPM    [address] [place], [place] [address]                Office [phone]  Fax [phone]

## 2023-08-21 ENCOUNTER — Other Ambulatory Visit: Payer: Self-pay | Admitting: Family Medicine

## 2023-08-21 DIAGNOSIS — Z1231 Encounter for screening mammogram for malignant neoplasm of breast: Secondary | ICD-10-CM

## 2023-09-01 ENCOUNTER — Encounter: Payer: Self-pay | Admitting: Podiatry

## 2023-09-01 ENCOUNTER — Ambulatory Visit (INDEPENDENT_AMBULATORY_CARE_PROVIDER_SITE_OTHER): Payer: Medicare Other | Admitting: Podiatry

## 2023-09-01 DIAGNOSIS — B353 Tinea pedis: Secondary | ICD-10-CM

## 2023-09-01 MED ORDER — CLOTRIMAZOLE-BETAMETHASONE 1-0.05 % EX CREA: TOPICAL_CREAM | CUTANEOUS | 3 refills | Status: AC

## 2023-09-01 NOTE — Progress Notes (Signed)
   Chief Complaint  Patient presents with   Tinea Pedis    "He told me to come back in four weeks if the cream didn't do anything."    SUBJECTIVE Patient presents to office today complaining of elongated, thickened nails that cause pain while ambulating in shoes.  Patient is unable to trim their own nails. Patient is here for further evaluation and treatment.  Past Medical History:  Diagnosis Date   Acute deep vein thrombosis (DVT) of both lower extremities (HCC)    Arthritis    Asthma    Blood clotting disorder (HCC)    Colon polyp 1994   Complication of anesthesia    nausea   Cystitis    Diabetes mellitus without complication (HCC)    no meds   Diffuse cystic mastopathy    Elevated liver enzymes 08/2021   Hypertension    PONV (postoperative nausea and vomiting)    in distant past    Allergies  Allergen Reactions   Cefazolin Other (See Comments)    Too much bilirubin built up in liver (liver turned yellow)     OBJECTIVE General Patient is awake, alert, and oriented x 3 and in no acute distress. Derm Skin is dry and supple bilateral.  Superficial fissure and break of the skin noted to the plantar sulcus of the right third toe. Vasc  DP and PT pedal pulses palpable bilaterally. Temperature gradient within normal limits.  Neuro grossly intact via light touch Musculoskeletal Exam No symptomatic pedal deformities noted bilateral. Muscular strength within normal limits.  ASSESSMENT 1.  Superficial fissure plantar aspect of the right third toe 2.  Chronic intermittent tinea pedis bilateral  PLAN OF CARE -Patient evaluated today.  - Silvadene cream provided to apply to the plantar aspect of the right third toe fissure -Refill prescription for Lotrisone cream which was printed and provided for the patient.  Apply as needed -Return to clinic as needed   Felecia Shelling, DPM [facility]  Dr. Felecia Shelling, DPM    [address] [place], [place] [address]                Office [phone]  Fax [phone]

## 2023-10-13 ENCOUNTER — Ambulatory Visit
Admission: RE | Admit: 2023-10-13 | Discharge: 2023-10-13 | Disposition: A | Discharge status: Home / Self Care | Payer: Medicare Other | Source: Ambulatory Visit | Attending: Family Medicine | Admitting: Family Medicine

## 2023-10-13 DIAGNOSIS — Z1231 Encounter for screening mammogram for malignant neoplasm of breast: Secondary | ICD-10-CM | POA: Insufficient documentation

## 2024-05-09 ENCOUNTER — Other Ambulatory Visit: Payer: Self-pay | Admitting: Student

## 2024-05-09 DIAGNOSIS — M47816 Spondylosis without myelopathy or radiculopathy, lumbar region: Secondary | ICD-10-CM

## 2024-05-17 ENCOUNTER — Ambulatory Visit
Admission: RE | Admit: 2024-05-17 | Discharge: 2024-05-17 | Disposition: A | Discharge status: Home / Self Care | Source: Ambulatory Visit | Attending: Student | Admitting: Student

## 2024-05-17 DIAGNOSIS — M47816 Spondylosis without myelopathy or radiculopathy, lumbar region: Secondary | ICD-10-CM | POA: Insufficient documentation

## 2024-08-29 ENCOUNTER — Other Ambulatory Visit: Payer: Self-pay | Admitting: Gastroenterology

## 2024-08-29 DIAGNOSIS — K7460 Unspecified cirrhosis of liver: Secondary | ICD-10-CM

## 2024-09-12 ENCOUNTER — Other Ambulatory Visit: Payer: Self-pay | Admitting: Family Medicine

## 2024-09-12 DIAGNOSIS — Z1231 Encounter for screening mammogram for malignant neoplasm of breast: Secondary | ICD-10-CM

## 2024-10-26 ENCOUNTER — Encounter

## 2024-11-08 ENCOUNTER — Ambulatory Visit

## 2024-11-28 ENCOUNTER — Encounter

## 2025-02-28 ENCOUNTER — Ambulatory Visit
